# Patient Record
Sex: Female | Born: 1950 | Race: White | Hispanic: No | Marital: Single | Smoking: Former smoker
Health system: Southern US, Community
[De-identification: ages and names within clinical notes are randomized; demographics above are authoritative.]

## PROBLEM LIST (undated history)

## (undated) DIAGNOSIS — M8580 Other specified disorders of bone density and structure, unspecified site: Secondary | ICD-10-CM

## (undated) DIAGNOSIS — T7840XA Allergy, unspecified, initial encounter: Secondary | ICD-10-CM

## (undated) DIAGNOSIS — I639 Cerebral infarction, unspecified: Secondary | ICD-10-CM

## (undated) DIAGNOSIS — M810 Age-related osteoporosis without current pathological fracture: Secondary | ICD-10-CM

## (undated) DIAGNOSIS — H269 Unspecified cataract: Secondary | ICD-10-CM

## (undated) DIAGNOSIS — C4491 Basal cell carcinoma of skin, unspecified: Secondary | ICD-10-CM

## (undated) DIAGNOSIS — I10 Essential (primary) hypertension: Secondary | ICD-10-CM

## (undated) DIAGNOSIS — Z5189 Encounter for other specified aftercare: Secondary | ICD-10-CM

## (undated) DIAGNOSIS — H348192 Central retinal vein occlusion, unspecified eye, stable: Secondary | ICD-10-CM

## (undated) DIAGNOSIS — M1990 Unspecified osteoarthritis, unspecified site: Secondary | ICD-10-CM

## (undated) DIAGNOSIS — E785 Hyperlipidemia, unspecified: Secondary | ICD-10-CM

## (undated) DIAGNOSIS — N200 Calculus of kidney: Secondary | ICD-10-CM

## (undated) DIAGNOSIS — H348392 Tributary (branch) retinal vein occlusion, unspecified eye, stable: Secondary | ICD-10-CM

## (undated) DIAGNOSIS — K219 Gastro-esophageal reflux disease without esophagitis: Secondary | ICD-10-CM

## (undated) DIAGNOSIS — K8020 Calculus of gallbladder without cholecystitis without obstruction: Secondary | ICD-10-CM

## (undated) HISTORY — DX: Hyperlipidemia, unspecified: E78.5

## (undated) HISTORY — PX: SPINE SURGERY: SHX786

## (undated) HISTORY — DX: Gastro-esophageal reflux disease without esophagitis: K21.9

## (undated) HISTORY — PX: CHOLECYSTECTOMY: SHX55

## (undated) HISTORY — PX: ABDOMINAL HYSTERECTOMY: SHX81

## (undated) HISTORY — PX: COLONOSCOPY: SHX174

## (undated) HISTORY — PX: APPENDECTOMY: SHX54

## (undated) HISTORY — DX: Tributary (branch) retinal vein occlusion, unspecified eye, stable: H34.8392

## (undated) HISTORY — DX: Calculus of gallbladder without cholecystitis without obstruction: K80.20

## (undated) HISTORY — DX: Basal cell carcinoma of skin, unspecified: C44.91

## (undated) HISTORY — PX: BASAL CELL CARCINOMA EXCISION: SHX1214

## (undated) HISTORY — DX: Essential (primary) hypertension: I10

## (undated) HISTORY — DX: Age-related osteoporosis without current pathological fracture: M81.0

## (undated) HISTORY — DX: Unspecified cataract: H26.9

## (undated) HISTORY — DX: Unspecified osteoarthritis, unspecified site: M19.90

## (undated) HISTORY — DX: Other specified disorders of bone density and structure, unspecified site: M85.80

## (undated) HISTORY — DX: Encounter for other specified aftercare: Z51.89

## (undated) HISTORY — DX: Allergy, unspecified, initial encounter: T78.40XA

## (undated) HISTORY — PX: JOINT REPLACEMENT: SHX530

## (undated) HISTORY — DX: Cerebral infarction, unspecified: I63.9

## (undated) HISTORY — DX: Calculus of kidney: N20.0

## (undated) HISTORY — DX: Central retinal vein occlusion, unspecified eye, stable: H34.8192

---

## 1998-08-13 ENCOUNTER — Other Ambulatory Visit: Admission: RE | Admit: 1998-08-13 | Discharge: 1998-08-13 | Payer: Self-pay | Admitting: Obstetrics and Gynecology

## 1998-08-17 ENCOUNTER — Other Ambulatory Visit: Admission: RE | Admit: 1998-08-17 | Discharge: 1998-08-17 | Payer: Self-pay | Admitting: Obstetrics and Gynecology

## 1998-08-20 ENCOUNTER — Ambulatory Visit (HOSPITAL_COMMUNITY): Admission: RE | Admit: 1998-08-20 | Discharge: 1998-08-20 | Payer: Self-pay | Admitting: Obstetrics and Gynecology

## 1998-08-20 ENCOUNTER — Encounter: Payer: Self-pay | Admitting: Obstetrics and Gynecology

## 1998-09-07 ENCOUNTER — Encounter: Payer: Self-pay | Admitting: Obstetrics and Gynecology

## 1998-09-09 ENCOUNTER — Inpatient Hospital Stay (HOSPITAL_COMMUNITY): Admission: RE | Admit: 1998-09-09 | Discharge: 1998-09-11 | Payer: Self-pay | Admitting: Obstetrics and Gynecology

## 2005-07-25 ENCOUNTER — Other Ambulatory Visit: Admission: RE | Admit: 2005-07-25 | Discharge: 2005-07-25 | Payer: Self-pay | Admitting: Obstetrics and Gynecology

## 2007-03-05 ENCOUNTER — Ambulatory Visit: Payer: Self-pay | Admitting: Gastroenterology

## 2012-09-08 ENCOUNTER — Ambulatory Visit (INDEPENDENT_AMBULATORY_CARE_PROVIDER_SITE_OTHER): Payer: BC Managed Care – PPO | Admitting: Family Medicine

## 2012-09-08 VITALS — BP 131/83 | HR 67 | Temp 98.2°F | Resp 16 | Ht 67.0 in | Wt 166.0 lb

## 2012-09-08 DIAGNOSIS — E785 Hyperlipidemia, unspecified: Secondary | ICD-10-CM

## 2012-09-08 DIAGNOSIS — Z79899 Other long term (current) drug therapy: Secondary | ICD-10-CM

## 2012-09-08 DIAGNOSIS — Z23 Encounter for immunization: Secondary | ICD-10-CM

## 2012-09-08 LAB — LIPID PANEL
Cholesterol: 191 mg/dL (ref 0–200)
HDL: 55 mg/dL (ref >39)
LDL Cholesterol: 106 mg/dL — ABNORMAL HIGH (ref 0–99)
Total CHOL/HDL Ratio: 3.5 Ratio
Triglycerides: 148 mg/dL (ref <150)
VLDL: 30 mg/dL (ref 0–40)

## 2012-09-08 LAB — COMPREHENSIVE METABOLIC PANEL
ALT: 16 U/L (ref 0–35)
AST: 17 U/L (ref 0–37)
Albumin: 4.8 g/dL (ref 3.5–5.2)
Alkaline Phosphatase: 81 U/L (ref 39–117)
BUN: 15 mg/dL (ref 6–23)
CO2: 26 mEq/L (ref 19–32)
Calcium: 10 mg/dL (ref 8.4–10.5)
Chloride: 105 mEq/L (ref 96–112)
Creat: 0.67 mg/dL (ref 0.50–1.10)
Glucose, Bld: 90 mg/dL (ref 70–99)
Potassium: 4.8 mEq/L (ref 3.5–5.3)
Sodium: 139 mEq/L (ref 135–145)
Total Bilirubin: 0.6 mg/dL (ref 0.3–1.2)
Total Protein: 7.3 g/dL (ref 6.0–8.3)

## 2012-09-08 MED ORDER — SIMVASTATIN 40 MG PO TABS: 40.0000 mg | ORAL_TABLET | Freq: Every day | ORAL | Status: DC

## 2012-09-08 NOTE — Progress Notes (Signed)
Subjective:    Patient ID: Lori Cook, female    DOB: 1951-09-06, 61 y.o.   MRN: [id number] Chief Complaint  Patient presents with  . Medication Refill    Cholesterol med; pt fasting other than a teaspoon of cream in a cup of coffee this morning; took last pill Thursday night    HPI  Needs chol check and refill on chol medication.  Needs flu shot. +Fhx, age are only risk factors.  She is tolerating the simvastatin well w/o any complaints.    Glucosamine/chondrointin for hips as developing more arthritis Past Medical History  Diagnosis Date  . Hyperlipidemia   . Blood transfusion without reported diagnosis   . Allergy    Past Surgical History  Procedure Laterality Date  . Appendectomy    . Spine surgery    . Abdominal hysterectomy     History   Social History  . Marital Status: Single    Spouse Name: N/A    Number of Children: N/A  . Years of Education: N/A   Social History Main Topics  . Smoking status: Former Smoker    Quit date: 09/08/2010  . Smokeless tobacco: None  . Alcohol Use: No  . Drug Use: No  . Sexually Active: None   Other Topics Concern  . None   Social History Narrative  . None    Family History  Problem Relation Age of Onset  . Stroke Father   . Diabetes Father   . Heart disease Father   . Heart disease Paternal Uncle    Current Outpatient Prescriptions on File Prior to Visit  Medication Sig Dispense Refill  . simvastatin (ZOCOR) 40 MG tablet Take 40 mg by mouth at bedtime.         Review of Systems  Constitutional: Positive for fatigue. Negative for fever, chills, diaphoresis and appetite change.  Eyes: Negative for visual disturbance.  Respiratory: Negative for cough and shortness of breath.   Cardiovascular: Negative for chest pain, palpitations and leg swelling.  Gastrointestinal: Negative for nausea and vomiting.  Genitourinary: Negative for decreased urine volume.  Musculoskeletal: Positive for arthralgias. Negative for  myalgias.  Skin: Negative for color change.  Neurological: Negative for syncope and headaches.  Hematological: Does not bruise/bleed easily.      BP 131/83  Pulse 67  Temp(Src) 98.2 F (36.8 C)  Resp 16  Ht 5\' 7"  (1.702 m)  Wt 166 lb (75.297 kg)  BMI 25.99 kg/m2  SpO2 98% Objective:   Physical Exam  Constitutional: She is oriented to person, place, and time. She appears well-developed and well-nourished. No distress.  HENT:  Head: Normocephalic and atraumatic.  Right Ear: External ear normal.  Left Ear: External ear normal.  Eyes: Conjunctivae are normal. No scleral icterus.  Neck: Normal range of motion. Neck supple. No thyromegaly present.  Cardiovascular: Normal rate, regular rhythm, normal heart sounds and intact distal pulses.   Pulmonary/Chest: Effort normal and breath sounds normal. No respiratory distress.  Musculoskeletal: She exhibits no edema.  Lymphadenopathy:    She has no cervical adenopathy.  Neurological: She is alert and oriented to person, place, and time.  Skin: Skin is warm and dry. She is not diaphoretic. No erythema.  Psychiatric: She has a normal mood and affect. Her behavior is normal.      Assessment & Plan:  Hyperlipidemia LDL goal < 130 - Plan: Lipid panel - refilled zocor x 1 mo only while we are waiting on labs to ensure she is on  the right medicine and dose.  Pt's only risk factors are age and family history.  Encounter for long-term (current) use of other medications - Plan: Comprehensive metabolic panel  Flu vaccine need  Meds ordered this encounter  Medications  . DISCONTD: simvastatin (ZOCOR) 40 MG tablet    Sig: Take 40 mg by mouth at bedtime.  . simvastatin (ZOCOR) 40 MG tablet    Sig: Take 1 tablet (40 mg total) by mouth at bedtime.    Dispense:  30 tablet    Refill:  0

## 2012-09-09 ENCOUNTER — Other Ambulatory Visit: Payer: Self-pay | Admitting: Family Medicine

## 2012-09-09 DIAGNOSIS — E785 Hyperlipidemia, unspecified: Secondary | ICD-10-CM | POA: Insufficient documentation

## 2012-09-09 MED ORDER — SIMVASTATIN 40 MG PO TABS: 40.0000 mg | ORAL_TABLET | Freq: Every day | ORAL | Status: DC

## 2012-12-25 ENCOUNTER — Ambulatory Visit: Payer: BC Managed Care – PPO

## 2012-12-25 ENCOUNTER — Ambulatory Visit (INDEPENDENT_AMBULATORY_CARE_PROVIDER_SITE_OTHER): Payer: BC Managed Care – PPO | Admitting: Family Medicine

## 2012-12-25 VITALS — BP 130/77 | HR 83 | Temp 98.0°F | Resp 16 | Ht 66.5 in | Wt 167.0 lb

## 2012-12-25 DIAGNOSIS — S2239XA Fracture of one rib, unspecified side, initial encounter for closed fracture: Secondary | ICD-10-CM

## 2012-12-25 DIAGNOSIS — S2232XA Fracture of one rib, left side, initial encounter for closed fracture: Secondary | ICD-10-CM

## 2012-12-25 DIAGNOSIS — R0781 Pleurodynia: Secondary | ICD-10-CM

## 2012-12-25 DIAGNOSIS — R079 Chest pain, unspecified: Secondary | ICD-10-CM

## 2012-12-25 MED ORDER — OXYCODONE-ACETAMINOPHEN 5-325 MG PO TABS: 1.0000 | ORAL_TABLET | Freq: Three times a day (TID) | ORAL | Status: DC | PRN

## 2012-12-25 MED ORDER — IBUPROFEN 800 MG PO TABS: 800.0000 mg | ORAL_TABLET | Freq: Three times a day (TID) | ORAL | Status: DC | PRN

## 2012-12-25 NOTE — Patient Instructions (Addendum)
Rib Fracture  Your caregiver has diagnosed you as having a rib fracture (a break). This can occur by a blow to the chest, by a fall against a hard object, or by violent coughing or sneezing. There may be one or many breaks. Rib fractures may heal on their own within 3 to 8 weeks. The longer healing period is usually associated with a continued cough or other aggravating activities.  HOME CARE INSTRUCTIONS    Avoid strenuous activity. Be careful during activities and avoid bumping the injured rib. Activities that cause pain pull on the fracture site(s) and are best avoided if possible.   Eat a normal, well-balanced diet. Drink plenty of fluids to avoid constipation.   Take deep breaths several times a day to keep lungs free of infection. Try to cough several times a day, splinting the injured area with a pillow. This will help prevent pneumonia.   Do not wear a rib belt or binder. These restrict breathing which can lead to pneumonia.   Only take over-the-counter or prescription medicines for pain, discomfort, or fever as directed by your caregiver.  SEEK MEDICAL CARE IF:   You develop a continual cough, associated with thick or bloody sputum.  SEEK IMMEDIATE MEDICAL CARE IF:    You have a fever.   You have difficulty breathing.   You have nausea (feeling sick to your stomach), vomiting, or abdominal (belly) pain.   You have worsening pain, not controlled with medications.  Document Released: 08/29/2005 Document Revised: 11/21/2011 Document Reviewed: 01/31/2007  Exit[employer] Patient Information 2013 [employer].

## 2012-12-25 NOTE — Progress Notes (Signed)
Subjective:    Patient ID: Lori Cook, female    DOB: 1951/04/16, 62 y.o.   MRN: [id number]   Chief Complaint  Patient presents with  . Chest Injury    left ribs pain x 3 day   HPI  Severday days ago she suddenly began choking on her dinner - asparagus - and her daughter did the HeimLech on her as she couldn't breath - it was done about 4-5 times and think she broke a left lower rib.  Still having cough that comes and goes - wonders if it could be allergies.  She has been taking ibuprofen 400mg  bid. Left lower rib is very sore with coughing and can't sleep due to pain. 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    Past Medical History  Diagnosis Date  . Hyperlipidemia   . Blood transfusion without reported diagnosis   . Allergy    Current Outpatient Prescriptions on File Prior to Visit  Medication Sig Dispense Refill  . simvastatin (ZOCOR) 40 MG tablet Take 1 tablet (40 mg total) by mouth at bedtime.  90 tablet  3   No current facility-administered medications on file prior to visit.   Allergies  Allergen Reactions  . Codeine Other (See Comments)    Headache    Review of Systems  Constitutional: Negative for fever, chills, diaphoresis, activity change and appetite change.  HENT: Positive for congestion, rhinorrhea and postnasal drip. Negative for sore throat, sneezing and sinus pressure.   Respiratory: Positive for cough and chest tightness. Negative for shortness of breath and wheezing.   Cardiovascular: Positive for chest pain.  Musculoskeletal: Positive for arthralgias. Negative for back pain and joint swelling.  Skin: Negative for rash.  Hematological: Negative for adenopathy.  Psychiatric/Behavioral: Positive for sleep disturbance.      BP 130/77  Pulse 83  Temp(Src) 98 F (36.7 C) (Oral)  Resp 16  Ht 5' 6.5" (1.689 m)  Wt 167 lb (75.751 kg)  BMI 26.55 kg/m2  SpO2 97% Objective:   Physical Exam  Constitutional: She is oriented to  person, place, and time. She appears well-developed and well-nourished. She does not appear ill. No distress.  HENT:  Head: Normocephalic and atraumatic.  Right Ear: External ear and ear canal normal. Tympanic membrane is injected and retracted. No middle ear effusion.  Left Ear: External ear and ear canal normal. Tympanic membrane is injected and retracted.  No middle ear effusion.  Nose: Mucosal edema present. No rhinorrhea.  Mouth/Throat: Uvula is midline and mucous membranes are normal. No oropharyngeal exudate, posterior oropharyngeal edema, posterior oropharyngeal erythema or tonsillar abscesses.  Eyes: Conjunctivae are normal. Right eye exhibits no discharge. Left eye exhibits no discharge. No scleral icterus.  Neck: Normal range of motion. Neck supple.  Cardiovascular: Normal rate, regular rhythm and intact distal pulses.   Murmur heard.  Decrescendo systolic murmur is present with a grade of 1/6  Pulmonary/Chest: Effort normal and breath sounds normal. She exhibits tenderness and bony tenderness. She exhibits no laceration, no deformity, no swelling and no retraction.  Lymphadenopathy:       Head (right side): No submandibular, no preauricular and no posterior auricular adenopathy present.       Head (left side): No submandibular, no preauricular and no posterior auricular adenopathy present.    She has no cervical adenopathy.       Right: No supraclavicular adenopathy present.       Left: No supraclavicular adenopathy present.  Neurological: She  is alert and oriented to person, place, and time.  Skin: Skin is warm and dry. She is not diaphoretic. No erythema.  Psychiatric: She has a normal mood and affect. Her behavior is normal.     [facility] reading (PRIMARY) by  Dr. Clelia Croft. Left rib films - lower lateral rib fracture, looks like rib 10.  No pneumothorax seen. Assessment & Plan:  Rib pain on left side - Plan: DG Ribs Unilateral W/Chest Left  Rib fracture, left, closed, initial  encounter - Pain control with medication - ibuprofen 800 tid plus prn percocet. Make sure pt keeps on breathing deep.  Percocet should help with cough suppression as well. Start allergy med like otc zyrtec.  Meds ordered this encounter  Medications  . oxyCODONE-acetaminophen (ROXICET) 5-325 MG per tablet    Sig: Take 1 tablet by mouth every 8 (eight) hours as needed for pain.    Dispense:  30 tablet    Refill:  0  . ibuprofen (ADVIL,MOTRIN) 800 MG tablet    Sig: Take 1 tablet (800 mg total) by mouth every 8 (eight) hours as needed for pain.    Dispense:  30 tablet    Refill:  2

## 2013-07-18 ENCOUNTER — Other Ambulatory Visit: Payer: Self-pay

## 2013-09-19 ENCOUNTER — Ambulatory Visit (INDEPENDENT_AMBULATORY_CARE_PROVIDER_SITE_OTHER): Payer: BC Managed Care – PPO | Admitting: Emergency Medicine

## 2013-09-19 VITALS — BP 142/84 | HR 68 | Temp 97.9°F | Resp 16 | Ht 67.0 in | Wt 171.6 lb

## 2013-09-19 DIAGNOSIS — Z23 Encounter for immunization: Secondary | ICD-10-CM

## 2013-09-19 DIAGNOSIS — H60339 Swimmer's ear, unspecified ear: Secondary | ICD-10-CM

## 2013-09-19 DIAGNOSIS — H60399 Other infective otitis externa, unspecified ear: Secondary | ICD-10-CM

## 2013-09-19 MED ORDER — CIPROFLOXACIN-HYDROCORTISONE 0.2-1 % OT SUSP: 3.0000 [drp] | Freq: Two times a day (BID) | OTIC | Status: DC

## 2013-09-19 NOTE — Patient Instructions (Signed)
Otitis Externa Otitis externa is a bacterial or fungal infection of the outer ear canal. This is the area from the eardrum to the outside of the ear. Otitis externa is sometimes called "swimmer's ear." CAUSES  Possible causes of infection include:  Swimming in dirty water.  Moisture remaining in the ear after swimming or bathing.  Mild injury (trauma) to the ear.  Objects stuck in the ear (foreign body).  Cuts or scrapes (abrasions) on the outside of the ear. SYMPTOMS  The first symptom of infection is often itching in the ear canal. Later signs and symptoms may include swelling and redness of the ear canal, ear pain, and yellowish-white fluid (pus) coming from the ear. The ear pain may be worse when pulling on the earlobe. DIAGNOSIS  Your caregiver will perform a physical exam. A sample of fluid may be taken from the ear and examined for bacteria or fungi. TREATMENT  Antibiotic ear drops are often given for 10 to 14 days. Treatment may also include pain medicine or corticosteroids to reduce itching and swelling. PREVENTION   Keep your ear dry. Use the corner of a towel to absorb water out of the ear canal after swimming or bathing.  Avoid scratching or putting objects inside your ear. This can damage the ear canal or remove the protective wax that lines the canal. This makes it easier for bacteria and fungi to grow.  Avoid swimming in lakes, polluted water, or poorly chlorinated pools.  You may use ear drops made of rubbing alcohol and vinegar after swimming. Combine equal parts of white vinegar and alcohol in a bottle. Put 3 or 4 drops into each ear after swimming. HOME CARE INSTRUCTIONS   Apply antibiotic ear drops to the ear canal as prescribed by your caregiver.  Only take over-the-counter or prescription medicines for pain, discomfort, or fever as directed by your caregiver.  If you have diabetes, follow any additional treatment instructions from your caregiver.  Keep all  follow-up appointments as directed by your caregiver. SEEK MEDICAL CARE IF:   You have a fever.  Your ear is still red, swollen, painful, or draining pus after 3 days.  Your redness, swelling, or pain gets worse.  You have a severe headache.  You have redness, swelling, pain, or tenderness in the area behind your ear. MAKE SURE YOU:   Understand these instructions.  Will watch your condition.  Will get help right away if you are not doing well or get worse. Document Released: 08/29/2005 Document Revised: 11/21/2011 Document Reviewed: 09/15/2011 [employer] Patient Information 2014 [employer].  

## 2013-09-19 NOTE — Progress Notes (Signed)
[facility] [address], [place] [address] [phone]  Date:  09/19/2013   Name:  Lori Cook   DOB:  12/25/1950   MRN:  [id number]  PCP:  No primary provider on file.    Chief Complaint: Rash, Otalgia and Flu Vaccine   History of Present Illness:  Lori Cook is a 63 y.o. very pleasant female patient who presents with the following:  Patient has a two week history of ear pain that stopped.  She says yesterday the pain in her right ear returned.  No sore throat, coryza, fever or chills.  Mild intermittent non productive cough.  No wheezing or shortness of breath .  Attributes cough to allergies.   Requests a flu shot.   No improvement with over the counter medications or other home remedies. Denies other complaint or health concern today.   Patient Active Problem List   Diagnosis Date Noted  . Hyperlipidemia LDL goal < 130 09/09/2012    Past Medical History  Diagnosis Date  . Hyperlipidemia   . Blood transfusion without reported diagnosis   . Allergy     Past Surgical History  Procedure Laterality Date  . Appendectomy    . Spine surgery    . Abdominal hysterectomy      History  Substance Use Topics  . Smoking status: Former Smoker    Quit date: 09/08/2010  . Smokeless tobacco: Not on file  . Alcohol Use: No    Family History  Problem Relation Age of Onset  . Stroke Father   . Diabetes Father   . Heart disease Father   . Heart disease Paternal Uncle     Allergies  Allergen Reactions  . Codeine Other (See Comments)    Headache    Medication list has been reviewed and updated.  Current Outpatient Prescriptions on File Prior to Visit  Medication Sig Dispense Refill  . simvastatin (ZOCOR) 40 MG tablet Take 1 tablet (40 mg total) by mouth at bedtime.  90 tablet  3  . ibuprofen (ADVIL,MOTRIN) 800 MG tablet Take 1 tablet (800 mg total) by mouth every 8 (eight) hours as needed for pain.  30 tablet  2  .  oxyCODONE-acetaminophen (ROXICET) 5-325 MG per tablet Take 1 tablet by mouth every 8 (eight) hours as needed for pain.  30 tablet  0   No current facility-administered medications on file prior to visit.    Review of Systems:  As per HPI, otherwise negative.    Physical Examination: Filed Vitals:   09/19/13 1844  BP: 142/84  Pulse: 68  Temp: 97.9 F (36.6 C)  Resp: 16   Filed Vitals:   09/19/13 1844  Height: 5\' 7"  (1.702 m)  Weight: 171 lb 9.6 oz (77.837 kg)   Body mass index is 26.87 kg/(m^2). Ideal Body Weight: Weight in (lb) to have BMI = 25: 159.3  GEN: WDWN, NAD, Non-toxic, A & O x 3 HEENT: Atraumatic, Normocephalic. Neck supple. No masses, No LAD. Ears and Nose: No external deformity.  Right external otitis CV: RRR, No M/G/R. No JVD. No thrill. No extra heart sounds. PULM: CTA B, no wheezes, crackles, rhonchi. No retractions. No resp. distress. No accessory muscle use. ABD: S, NT, ND, +BS. No rebound. No HSM. EXTR: No c/c/e NEURO Normal gait.  PSYCH: Normally interactive. Conversant. Not depressed or anxious appearing.  Calm demeanor.    Assessment and Plan: Otitis externa cipro HC    Signed,  Ellison Carwin,  MD

## 2013-11-09 ENCOUNTER — Ambulatory Visit (INDEPENDENT_AMBULATORY_CARE_PROVIDER_SITE_OTHER): Payer: BC Managed Care – PPO | Admitting: Family Medicine

## 2013-11-09 VITALS — BP 144/90 | HR 66 | Temp 97.9°F | Resp 16 | Ht 67.0 in | Wt 169.0 lb

## 2013-11-09 DIAGNOSIS — E785 Hyperlipidemia, unspecified: Secondary | ICD-10-CM

## 2013-11-09 DIAGNOSIS — Z23 Encounter for immunization: Secondary | ICD-10-CM

## 2013-11-09 LAB — COMPREHENSIVE METABOLIC PANEL
ALT: 21 U/L (ref 0–35)
AST: 20 U/L (ref 0–37)
Albumin: 4.7 g/dL (ref 3.5–5.2)
Alkaline Phosphatase: 94 U/L (ref 39–117)
BILIRUBIN TOTAL: 0.6 mg/dL (ref 0.2–1.2)
BUN: 13 mg/dL (ref 6–23)
CHLORIDE: 105 meq/L (ref 96–112)
CO2: 27 mEq/L (ref 19–32)
CREATININE: 0.6 mg/dL (ref 0.50–1.10)
Calcium: 10.3 mg/dL (ref 8.4–10.5)
Glucose, Bld: 91 mg/dL (ref 70–99)
Potassium: 5 mEq/L (ref 3.5–5.3)
Sodium: 141 mEq/L (ref 135–145)
Total Protein: 7.5 g/dL (ref 6.0–8.3)

## 2013-11-09 LAB — LIPID PANEL
CHOL/HDL RATIO: 3.9 ratio
Cholesterol: 193 mg/dL (ref 0–200)
HDL: 49 mg/dL (ref >39)
LDL Cholesterol: 102 mg/dL — ABNORMAL HIGH (ref 0–99)
Triglycerides: 211 mg/dL — ABNORMAL HIGH (ref <150)
VLDL: 42 mg/dL — ABNORMAL HIGH (ref 0–40)

## 2013-11-09 MED ORDER — ZOSTER VACCINE LIVE 19400 UNT/0.65ML ~~LOC~~ SOLR: 0.6500 mL | Freq: Once | SUBCUTANEOUS | Status: DC

## 2013-11-09 MED ORDER — SIMVASTATIN 40 MG PO TABS: 40.0000 mg | ORAL_TABLET | Freq: Every day | ORAL | Status: DC

## 2013-11-09 NOTE — Patient Instructions (Addendum)
TDAP and pneumococcal vaccine will be given today  Take the prescription for the shingles vaccine to a pharmacy to get your vaccination.  Advise returning to your dermatologist  Continue the same medicine for your cholesterol  Get regular exercise  Return as needed or in one year

## 2013-11-09 NOTE — Progress Notes (Signed)
Subjective: 63 year old lady who is here for a followup on her lipids and refill of medications. She feels well. Review of systems is essentially unremarkable. HEENT normal. Cardiorespiratory unremarkable. GI and GU unremarkable. Osseous skeletal unremarkable. She has some aches and pains. 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 She has questions about the shingles vaccine which we discussed.  Objective: Alert healthy appearing lady in no distress. Her TMs are normal. Murmurs gallops or arrhythmias. Abdomen soft and nontender without masses. Extremities without edema. She does have a splotchy rash on her left leg upper portion of the shin. This has been biopsied in the past and she is seeing a dermatologist in the past. He has a mildly active appearing border with a maroon discoloration centrally.  Assessment: Hyperlipidemia Immunization review Dermatitis leg, etiology unclear--old pathology report from 2007 located. The diagnosis was granulomatous perifolliculitis  Plan: I think she needs to see the dermatologist back Shingles vaccine Continue lipid medications and check labs today

## 2013-11-10 ENCOUNTER — Encounter: Payer: Self-pay | Admitting: Family Medicine

## 2014-01-30 ENCOUNTER — Encounter: Payer: Self-pay | Admitting: Emergency Medicine

## 2014-11-26 ENCOUNTER — Ambulatory Visit (INDEPENDENT_AMBULATORY_CARE_PROVIDER_SITE_OTHER): Payer: BLUE CROSS/BLUE SHIELD | Admitting: Family Medicine

## 2014-11-26 VITALS — BP 138/86 | HR 57 | Temp 97.5°F | Resp 16 | Ht 67.0 in | Wt 165.0 lb

## 2014-11-26 DIAGNOSIS — R2231 Localized swelling, mass and lump, right upper limb: Secondary | ICD-10-CM

## 2014-11-26 DIAGNOSIS — E785 Hyperlipidemia, unspecified: Secondary | ICD-10-CM

## 2014-11-26 DIAGNOSIS — Z5181 Encounter for therapeutic drug level monitoring: Secondary | ICD-10-CM | POA: Diagnosis not present

## 2014-11-26 LAB — COMPREHENSIVE METABOLIC PANEL
ALBUMIN: 4.4 g/dL (ref 3.5–5.2)
ALK PHOS: 90 U/L (ref 39–117)
ALT: 15 U/L (ref 0–35)
AST: 17 U/L (ref 0–37)
BUN: 13 mg/dL (ref 6–23)
CALCIUM: 9.7 mg/dL (ref 8.4–10.5)
CO2: 25 mEq/L (ref 19–32)
Chloride: 104 mEq/L (ref 96–112)
Creat: 0.58 mg/dL (ref 0.50–1.10)
Glucose, Bld: 81 mg/dL (ref 70–99)
POTASSIUM: 4.3 meq/L (ref 3.5–5.3)
SODIUM: 141 meq/L (ref 135–145)
TOTAL PROTEIN: 6.9 g/dL (ref 6.0–8.3)
Total Bilirubin: 0.5 mg/dL (ref 0.2–1.2)

## 2014-11-26 LAB — LIPID PANEL
CHOLESTEROL: 180 mg/dL (ref 0–200)
HDL: 48 mg/dL (ref >=46)
LDL CALC: 94 mg/dL (ref 0–99)
TRIGLYCERIDES: 190 mg/dL — AB (ref <150)
Total CHOL/HDL Ratio: 3.8 Ratio
VLDL: 38 mg/dL (ref 0–40)

## 2014-11-26 MED ORDER — SIMVASTATIN 40 MG PO TABS: 40.0000 mg | ORAL_TABLET | Freq: Every day | ORAL | Status: DC

## 2014-11-26 NOTE — Patient Instructions (Signed)
We will set you up to see the hand specialist for the bump on your wrist I will be in touch with your labs and refilled your cholesterol medication today Take care!

## 2014-11-26 NOTE — Progress Notes (Signed)
[facility] [address], [place] [address] [phone]  Date:  11/26/2014   Name:  Lori Cook   DOB:  1951/03/07   MRN:  [id number]  PCP:  No primary care provider on file.    Chief Complaint: Medication Refill   History of Present Illness:  Lori Cook is a 64 y.o. very pleasant female patient who presents with the following:  She is here today for labs- she is fasting. She is generally in good health except for high cholesterol.  She is on simvastatin and needs this refilled.   She has also noted a bump on the ventrum of her right wrist for a couple of years- it does not hurt but seems to be getting a bit larger.   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 Hyperlipidemia LDL goal < 130 09/09/2012    Past Medical History  Diagnosis Date  . Hyperlipidemia   . Blood transfusion without reported diagnosis   . Allergy     Past Surgical History  Procedure Laterality Date  . Appendectomy    . Spine surgery    . Abdominal hysterectomy      History  Substance Use Topics  . Smoking status: Former Smoker    Quit date: 09/08/2010  . Smokeless tobacco: Not on file  . Alcohol Use: No    Family History  Problem Relation Age of Onset  . Stroke Father   . Diabetes Father   . Heart disease Father   . Heart disease Paternal Uncle     Allergies  Allergen Reactions  . Codeine Other (See Comments)    Headache    Medication list has been reviewed and updated.  Current Outpatient Prescriptions on File Prior to Visit  Medication Sig Dispense Refill  . ibuprofen (ADVIL,MOTRIN) 800 MG tablet Take 1 tablet (800 mg total) by mouth every 8 (eight) hours as needed for pain. 30 tablet 2  . simvastatin (ZOCOR) 40 MG tablet Take 1 tablet (40 mg total) by mouth at bedtime. 90 tablet 3  . zoster vaccine live, PF, (ZOSTAVAX) 73220 UNT/0.65ML injection Inject 19,400 Units into the  skin once. 1 each 0   No current facility-administered medications on file prior to visit.    Review of Systems:  As per HPI- otherwise negative.   Physical Examination: Filed Vitals:   11/26/14 1102  BP: 138/86  Pulse: 57  Temp: 97.5 F (36.4 C)  Resp: 16   Filed Vitals:   11/26/14 1102  Height: 5\' 7"  (1.702 m)  Weight: 165 lb (74.844 kg)   Body mass index is 25.84 kg/(m^2). Ideal Body Weight: Weight in (lb) to have BMI = 25: 159.3  GEN: WDWN, NAD, Non-toxic, A & O x 3, looks well HEENT: Atraumatic, Normocephalic. Neck supple. No masses, No LAD. Ears and Nose: No external deformity. CV: RRR, No M/G/R. No JVD. No thrill. No extra heart sounds. PULM: CTA B, no wheezes, crackles, rhonchi. No retractions. No resp. distress. No accessory muscle use. EXTR: No c/c/e NEURO Normal gait.  PSYCH: Normally interactive. Conversant. Not depressed or anxious appearing.  Calm demeanor.  Right wrist: there is a small, soft mass on the ventral side of her wrist. It is compressible and will disappear into her wrist with pressure.  No tenderness, redness or heat  Assessment and Plan: Hyperlipidemia - Plan: simvastatin (ZOCOR) 40 MG tablet, Lipid panel  Medication  monitoring encounter - Plan: Comprehensive metabolic panel  Mass of wrist, right - Plan: Ambulatory referral to Hand Surgery  Await labs and will be in touch with her, refilled her chl medication, referral to look at her wrist   Signed Lamar Blinks, MD

## 2015-01-24 LAB — HM MAMMOGRAPHY

## 2015-02-02 ENCOUNTER — Other Ambulatory Visit: Payer: Self-pay | Admitting: Orthopedic Surgery

## 2015-02-02 DIAGNOSIS — M25531 Pain in right wrist: Secondary | ICD-10-CM

## 2015-02-10 ENCOUNTER — Other Ambulatory Visit: Payer: Self-pay

## 2015-02-11 DIAGNOSIS — H348392 Tributary (branch) retinal vein occlusion, unspecified eye, stable: Secondary | ICD-10-CM

## 2015-02-11 HISTORY — DX: Tributary (branch) retinal vein occlusion, unspecified eye, stable: H34.8392

## 2015-02-19 ENCOUNTER — Ambulatory Visit
Admission: RE | Admit: 2015-02-19 | Discharge: 2015-02-19 | Disposition: A | Discharge status: Home / Self Care | Payer: BLUE CROSS/BLUE SHIELD | Source: Ambulatory Visit | Attending: Orthopedic Surgery | Admitting: Orthopedic Surgery

## 2015-02-19 ENCOUNTER — Other Ambulatory Visit: Payer: Self-pay | Admitting: Orthopedic Surgery

## 2015-02-19 DIAGNOSIS — M25531 Pain in right wrist: Secondary | ICD-10-CM

## 2015-03-08 ENCOUNTER — Ambulatory Visit (INDEPENDENT_AMBULATORY_CARE_PROVIDER_SITE_OTHER): Payer: BLUE CROSS/BLUE SHIELD | Admitting: Internal Medicine

## 2015-03-08 VITALS — BP 162/90 | HR 67 | Temp 98.4°F | Resp 18 | Wt 162.0 lb

## 2015-03-08 DIAGNOSIS — H349 Unspecified retinal vascular occlusion: Secondary | ICD-10-CM | POA: Diagnosis not present

## 2015-03-08 DIAGNOSIS — H348192 Central retinal vein occlusion, unspecified eye, stable: Secondary | ICD-10-CM

## 2015-03-08 DIAGNOSIS — E785 Hyperlipidemia, unspecified: Secondary | ICD-10-CM | POA: Diagnosis not present

## 2015-03-08 DIAGNOSIS — I10 Essential (primary) hypertension: Secondary | ICD-10-CM | POA: Diagnosis not present

## 2015-03-08 LAB — COMPREHENSIVE METABOLIC PANEL
ALBUMIN: 4.6 g/dL (ref 3.5–5.2)
ALK PHOS: 96 U/L (ref 39–117)
ALT: 17 U/L (ref 0–35)
AST: 17 U/L (ref 0–37)
BUN: 18 mg/dL (ref 6–23)
CALCIUM: 9.9 mg/dL (ref 8.4–10.5)
CO2: 27 meq/L (ref 19–32)
Chloride: 106 mEq/L (ref 96–112)
Creat: 0.66 mg/dL (ref 0.50–1.10)
GLUCOSE: 93 mg/dL (ref 70–99)
POTASSIUM: 4.7 meq/L (ref 3.5–5.3)
SODIUM: 141 meq/L (ref 135–145)
Total Bilirubin: 0.6 mg/dL (ref 0.2–1.2)
Total Protein: 7.5 g/dL (ref 6.0–8.3)

## 2015-03-08 LAB — POCT URINALYSIS DIPSTICK
BILIRUBIN UA: NEGATIVE
Glucose, UA: NEGATIVE
KETONES UA: NEGATIVE
Leukocytes, UA: NEGATIVE
NITRITE UA: NEGATIVE
PROTEIN UA: NEGATIVE
Spec Grav, UA: 1.025
UROBILINOGEN UA: 0.2
pH, UA: 5

## 2015-03-08 LAB — CBC WITH DIFFERENTIAL/PLATELET
BASOS PCT: 0 % (ref 0–1)
Basophils Absolute: 0 10*3/uL (ref 0.0–0.1)
EOS ABS: 0.2 10*3/uL (ref 0.0–0.7)
Eosinophils Relative: 2 % (ref 0–5)
HCT: 42.2 % (ref 36.0–46.0)
Hemoglobin: 14.7 g/dL (ref 12.0–15.0)
LYMPHS ABS: 2.8 10*3/uL (ref 0.7–4.0)
LYMPHS PCT: 35 % (ref 12–46)
MCH: 30.1 pg (ref 26.0–34.0)
MCHC: 34.8 g/dL (ref 30.0–36.0)
MCV: 86.3 fL (ref 78.0–100.0)
MPV: 12.7 fL — ABNORMAL HIGH (ref 8.6–12.4)
Monocytes Absolute: 0.9 10*3/uL (ref 0.1–1.0)
Monocytes Relative: 11 % (ref 3–12)
NEUTROS ABS: 4.1 10*3/uL (ref 1.7–7.7)
Neutrophils Relative %: 52 % (ref 43–77)
Platelets: 276 10*3/uL (ref 150–400)
RBC: 4.89 MIL/uL (ref 3.87–5.11)
RDW: 13.6 % (ref 11.5–15.5)
WBC: 7.9 10*3/uL (ref 4.0–10.5)

## 2015-03-08 LAB — POCT UA - MICROSCOPIC ONLY
CRYSTALS, UR, HPF, POC: NEGATIVE
Casts, Ur, LPF, POC: NEGATIVE
YEAST UA: NEGATIVE

## 2015-03-08 LAB — POCT GLYCOSYLATED HEMOGLOBIN (HGB A1C): Hemoglobin A1C: 5.5

## 2015-03-08 MED ORDER — LISINOPRIL 10 MG PO TABS: 10.0000 mg | ORAL_TABLET | Freq: Every day | ORAL | Status: DC

## 2015-03-08 NOTE — Progress Notes (Signed)
Subjective:    Patient ID: Lori Cook, female    DOB: 1951-01-29, 64 y.o.   MRN: [id number] This chart was scribed for Tami Lin, MD by Zola Button, Medical Scribe. This patient was seen in Room 2 and the patient's care was started at 9:46 AM.   HPI HPI Comments: Aury Scollard is a 64 y.o. female with a hx of hyperlipidemia who presents to the [facility] to be evaluated for possible hypertension. Patient had been told by her eye doctor to be checked for hypertension. Patient notes that she woke up with a headache yesterday and today. She also reports having some right ear pain that started last night, but has improved some this morning. Her blood pressure at triage today was 162/90. Patient denies hearing changes, nausea, dizziness, SOB, urinary symptoms, leg swelling, and palpitations. FMHx includes hypertension in her brother, sister, and father. She does not check her blood pressure at home. She states that her blood pressure was relatively normal when she was seen here 3 months ago and when she had her mammogram within the last 3 months.  Patient had sudden onset blurred vision in her right eye that started 3-4 weeks ago. She initially thought this was due to cataracts, but she was found to have a branch retinal vein occlusion upon eye evaluation. She still has some blurring of her vision in her right eye currently. She has an appointment with a retina specialist on 7/13.  Patient Active Problem List   Diagnosis Date Noted  . Hyperlipidemia LDL goal < 130 09/09/2012    Review of Systems  Constitutional: Negative for activity change, appetite change, fatigue and unexpected weight change.  Eyes: Positive for visual disturbance. Negative for photophobia and redness.  Respiratory: Negative for chest tightness and shortness of breath.   Cardiovascular: Negative for chest pain, palpitations and leg swelling.  Gastrointestinal: Negative for abdominal pain.    Genitourinary: Negative for difficulty urinating.  Musculoskeletal: Negative for back pain.  Neurological: Negative for tremors and speech difficulty.  Hematological: Negative for adenopathy. Does not bruise/bleed easily.  Psychiatric/Behavioral: Negative for behavioral problems and sleep disturbance.       Objective:   Physical Exam  Constitutional: She is oriented to person, place, and time. She appears well-developed and well-nourished. No distress.  HENT:  Head: Normocephalic and atraumatic.  Right Ear: External ear normal.  Left Ear: External ear normal.  Mouth/Throat: Oropharynx is clear and moist. No oropharyngeal exudate.  Eyes: Conjunctivae and EOM are normal. Pupils are equal, round, and reactive to light.  Neck: Neck supple. No thyromegaly present.  Cardiovascular: Normal rate, regular rhythm, normal heart sounds and intact distal pulses.   No murmur heard. Pulmonary/Chest: Effort normal and breath sounds normal.  Musculoskeletal: She exhibits no edema.  Lymphadenopathy:    She has no cervical adenopathy.  Neurological: She is alert and oriented to person, place, and time. No cranial nerve deficit. Coordination normal.  Skin: Skin is warm and dry. No rash noted.  Psychiatric: She has a normal mood and affect. Her behavior is normal. Thought content normal.  Vitals reviewed. BP 162/90 mmHg  Pulse 67  Temp(Src) 98.4 F (36.9 C) (Oral)  Resp 18  Wt 162 lb (73.483 kg)  SpO2 99%  Results for orders placed or performed in visit on 03/08/15  POCT urinalysis dipstick  Result Value Ref Range   Color, UA yelow    Clarity, UA clear    Glucose, UA neg  Bilirubin, UA neg    Ketones, UA neg    Spec Grav, UA 1.025    Blood, UA small    pH, UA 5.0    Protein, UA neg    Urobilinogen, UA 0.2    Nitrite, UA neg    Leukocytes, UA Negative Negative  POCT UA - Microscopic Only  Result Value Ref Range   WBC, Ur, HPF, POC 1-3    RBC, urine, microscopic 3-5    Bacteria, U  Microscopic trace    Mucus, UA 1+    Epithelial cells, urine per micros 2-4    Crystals, Ur, HPF, POC neg    Casts, Ur, LPF, POC neg    Yeast, UA neg   POCT glycosylated hemoglobin (Hb A1C)  Result Value Ref Range   Hemoglobin A1C 5.5        Assessment & Plan:  Problem #1 new onset hypertension Problem #2 recent retinal vein occlusion Problem #3 underlying hyperlipidemia  Start lisinopril Baseline labs for hypertension Home blood pressures Follow-up blood pressure results 4 weeks at 104 to complete diagnostic workup including EKG and possibly chest x-ray  I have completed the patient encounter in its entirety as documented by the scribe, with editing by me where necessary. Emalie Mcwethy P. Laney Pastor, M.D.

## 2015-03-09 ENCOUNTER — Telehealth: Payer: Self-pay | Admitting: *Deleted

## 2015-03-09 NOTE — Telephone Encounter (Signed)
Received mammo report dated 01/24/15 - no mammographic evidence of malignancy.  Updated health maintenance, abstracted and sent to med rec to scan.

## 2015-03-09 NOTE — Telephone Encounter (Signed)
-----   Message from Ritta Slot sent at 03/08/2015  9:40 AM EDT ----- Pt states she had a mammo a couple months ago at [facility]/dr betrand.

## 2015-03-09 NOTE — Telephone Encounter (Signed)
Per message from Jerilee Hoh, I faxed mammo request to [place]....  Will update health maintenance once received.

## 2015-03-11 NOTE — Progress Notes (Signed)
lmom to call to make an appt Dr Brigitte Pulse HTN f/u 4 weeks

## 2015-03-17 ENCOUNTER — Telehealth: Payer: Self-pay

## 2015-03-17 NOTE — Telephone Encounter (Signed)
Dr. Laney Pastor,  Patient was prescribed BP medication and now has a cough (one of the side effects of the medication)     Please call and advise  [phone]

## 2015-03-18 MED ORDER — HYDROCHLOROTHIAZIDE 12.5 MG PO CAPS: 12.5000 mg | ORAL_CAPSULE | Freq: Every day | ORAL | Status: DC

## 2015-03-18 NOTE — Telephone Encounter (Signed)
Stop lisin Start hct 12.5 mg Set up f/u appt w/ dr Brigitte Pulse as directed

## 2015-03-18 NOTE — Telephone Encounter (Signed)
Spoke with pt, advised message from Dr. Doolittle. 

## 2015-04-24 ENCOUNTER — Ambulatory Visit (INDEPENDENT_AMBULATORY_CARE_PROVIDER_SITE_OTHER): Payer: BLUE CROSS/BLUE SHIELD

## 2015-04-24 ENCOUNTER — Ambulatory Visit (INDEPENDENT_AMBULATORY_CARE_PROVIDER_SITE_OTHER): Payer: BLUE CROSS/BLUE SHIELD | Admitting: Family Medicine

## 2015-04-24 VITALS — BP 140/86 | HR 69 | Temp 97.6°F | Resp 14 | Ht 67.0 in | Wt 160.0 lb

## 2015-04-24 DIAGNOSIS — R05 Cough: Secondary | ICD-10-CM | POA: Diagnosis not present

## 2015-04-24 DIAGNOSIS — R059 Cough, unspecified: Secondary | ICD-10-CM

## 2015-04-24 DIAGNOSIS — R682 Dry mouth, unspecified: Secondary | ICD-10-CM

## 2015-04-24 DIAGNOSIS — E7800 Pure hypercholesterolemia, unspecified: Secondary | ICD-10-CM

## 2015-04-24 DIAGNOSIS — R0789 Other chest pain: Secondary | ICD-10-CM

## 2015-04-24 DIAGNOSIS — I10 Essential (primary) hypertension: Secondary | ICD-10-CM | POA: Insufficient documentation

## 2015-04-24 DIAGNOSIS — R0781 Pleurodynia: Secondary | ICD-10-CM

## 2015-04-24 DIAGNOSIS — H34839 Tributary (branch) retinal vein occlusion, unspecified eye: Secondary | ICD-10-CM

## 2015-04-24 DIAGNOSIS — H348392 Tributary (branch) retinal vein occlusion, unspecified eye, stable: Secondary | ICD-10-CM | POA: Insufficient documentation

## 2015-04-24 DIAGNOSIS — J301 Allergic rhinitis due to pollen: Secondary | ICD-10-CM | POA: Diagnosis not present

## 2015-04-24 LAB — LIPID PANEL
CHOLESTEROL: 186 mg/dL (ref 125–200)
HDL: 51 mg/dL (ref >=46)
LDL Cholesterol: 90 mg/dL (ref <130)
TRIGLYCERIDES: 223 mg/dL — AB (ref <150)
Total CHOL/HDL Ratio: 3.6 Ratio (ref <=5.0)
VLDL: 45 mg/dL — AB (ref <30)

## 2015-04-24 LAB — CBC WITH DIFFERENTIAL/PLATELET
Basophils Absolute: 0 10*3/uL (ref 0.0–0.1)
Basophils Relative: 0 % (ref 0–1)
EOS PCT: 1 % (ref 0–5)
Eosinophils Absolute: 0.1 10*3/uL (ref 0.0–0.7)
HCT: 42.8 % (ref 36.0–46.0)
HEMOGLOBIN: 14.8 g/dL (ref 12.0–15.0)
LYMPHS PCT: 34 % (ref 12–46)
Lymphs Abs: 3 10*3/uL (ref 0.7–4.0)
MCH: 29.8 pg (ref 26.0–34.0)
MCHC: 34.6 g/dL (ref 30.0–36.0)
MCV: 86.1 fL (ref 78.0–100.0)
MONO ABS: 1 10*3/uL (ref 0.1–1.0)
MPV: 12.6 fL — AB (ref 8.6–12.4)
Monocytes Relative: 11 % (ref 3–12)
Neutro Abs: 4.8 10*3/uL (ref 1.7–7.7)
Neutrophils Relative %: 54 % (ref 43–77)
Platelets: 311 10*3/uL (ref 150–400)
RBC: 4.97 MIL/uL (ref 3.87–5.11)
RDW: 13.9 % (ref 11.5–15.5)
WBC: 8.8 10*3/uL (ref 4.0–10.5)

## 2015-04-24 LAB — COMPREHENSIVE METABOLIC PANEL
ALBUMIN: 4.9 g/dL (ref 3.6–5.1)
ALT: 18 U/L (ref 6–29)
AST: 21 U/L (ref 10–35)
Alkaline Phosphatase: 94 U/L (ref 33–130)
BILIRUBIN TOTAL: 0.5 mg/dL (ref 0.2–1.2)
BUN: 15 mg/dL (ref 7–25)
CALCIUM: 10.5 mg/dL — AB (ref 8.6–10.4)
CO2: 30 mmol/L (ref 20–31)
Chloride: 99 mmol/L (ref 98–110)
Creat: 0.75 mg/dL (ref 0.50–0.99)
GLUCOSE: 96 mg/dL (ref 65–99)
Potassium: 4.3 mmol/L (ref 3.5–5.3)
SODIUM: 139 mmol/L (ref 135–146)
Total Protein: 7.6 g/dL (ref 6.1–8.1)

## 2015-04-24 LAB — TSH: TSH: 3.242 u[IU]/mL (ref 0.350–4.500)

## 2015-04-24 MED ORDER — OMEPRAZOLE 20 MG PO CPDR: 20.0000 mg | DELAYED_RELEASE_CAPSULE | Freq: Every day | ORAL | Status: DC

## 2015-04-24 MED ORDER — HYDROCHLOROTHIAZIDE 12.5 MG PO CAPS: 12.5000 mg | ORAL_CAPSULE | Freq: Every day | ORAL | Status: DC

## 2015-04-24 NOTE — Patient Instructions (Signed)
1.  Call in one if cough has persisted.  Hypertension Hypertension, commonly called high blood pressure, is when the force of blood pumping through your arteries is too strong. Your arteries are the blood vessels that carry blood from your heart throughout your body. A blood pressure reading consists of a higher number over a lower number, such as 110/72. The higher number (systolic) is the pressure inside your arteries when your heart pumps. The lower number (diastolic) is the pressure inside your arteries when your heart relaxes. Ideally you want your blood pressure below 120/80. Hypertension forces your heart to work harder to pump blood. Your arteries may become narrow or stiff. Having hypertension puts you at risk for heart disease, stroke, and other problems.  RISK FACTORS Some risk factors for high blood pressure are controllable. Others are not.  Risk factors you cannot control include:   Race. You may be at higher risk if you are African American.  Age. Risk increases with age.  Gender. Men are at higher risk than women before age 33 years. After age 59, women are at higher risk than men. Risk factors you can control include:  Not getting enough exercise or physical activity.  Being overweight.  Getting too much fat, sugar, calories, or salt in your diet.  Drinking too much alcohol. SIGNS AND SYMPTOMS Hypertension does not usually cause signs or symptoms. Extremely high blood pressure (hypertensive crisis) may cause headache, anxiety, shortness of breath, and nosebleed. DIAGNOSIS  To check if you have hypertension, your health care provider will measure your blood pressure while you are seated, with your arm held at the level of your heart. It should be measured at least twice using the same arm. Certain conditions can cause a difference in blood pressure between your right and left arms. A blood pressure reading that is higher than normal on one occasion does not mean that you  need treatment. If one blood pressure reading is high, ask your health care provider about having it checked again. TREATMENT  Treating high blood pressure includes making lifestyle changes and possibly taking medicine. Living a healthy lifestyle can help lower high blood pressure. You may need to change some of your habits. Lifestyle changes may include:  Following the DASH diet. This diet is high in fruits, vegetables, and whole grains. It is low in salt, red meat, and added sugars.  Getting at least 2 hours of brisk physical activity every week.  Losing weight if necessary.  Not smoking.  Limiting alcoholic beverages.  Learning ways to reduce stress. If lifestyle changes are not enough to get your blood pressure under control, your health care provider may prescribe medicine. You may need to take more than one. Work closely with your health care provider to understand the risks and benefits. HOME CARE INSTRUCTIONS  Have your blood pressure rechecked as directed by your health care provider.   Take medicines only as directed by your health care provider. Follow the directions carefully. Blood pressure medicines must be taken as prescribed. The medicine does not work as well when you skip doses. Skipping doses also puts you at risk for problems.   Do not smoke.   Monitor your blood pressure at home as directed by your health care provider. SEEK MEDICAL CARE IF:   You think you are having a reaction to medicines taken.  You have recurrent headaches or feel dizzy.  You have swelling in your ankles.  You have trouble with your vision. [place]  CARE IF:  You develop a severe headache or confusion.  You have unusual weakness, numbness, or feel faint.  You have severe chest or abdominal pain.  You vomit repeatedly.  You have trouble breathing. MAKE SURE YOU:   Understand these instructions.  Will watch your condition.  Will get help right away if you  are not doing well or get worse. Document Released: 08/29/2005 Document Revised: 01/13/2014 Document Reviewed: 06/21/2013 [facility] Patient Information 2015 [place], [place]. This information is not intended to replace advice given to you by your health care provider. Make sure you discuss any questions you have with your health care provider.

## 2015-04-24 NOTE — Progress Notes (Signed)
Subjective:  This chart was scribed for Reginia Forts, MD by Thea Alken, ED Scribe. This patient was seen in room 1 and the patient's care was started at 10:46 AM.   Patient ID: Lori Cook, female    DOB: 11/18/1950, 64 y.o.   MRN: [id number]  HPI   Chief Complaint  Patient presents with  . Hypertension    Recheck  . Cough  . Rib Injury    [place] last week, left side pain   HPI Comments: Lori Cook is a 64 y.o.  who presents to the [facility] for HTN follow up.  Pt was seen here 6 weeks ago by Dr. Laney Pastor for new onset of HTN. Baseline labs at that Visit. She was started on lisinopril. Pt has had persistent, non productive, cough since being started on lisinopril medication. Per telephone note 7/05, lisinopril was discontinued and started on HCTZ 12.5 mg. She is tolerating medication well, but still has cough. No sick contacts. She denies fever, chills, diaphoresis, sneezing, sore throat, ear pain, SOB. She denies hx of asthma. Pt checks BP at home which have been, 128/80, but has not checked it regularly this week. Pt states she often clears her throat due to having a dry mouth. Is currently taking zyrtec for seasonal allergies. Home systolic readings 672-094.  Pt also c/o of a fall. Pt was walking out of an apartment, stepped over trash, and fell backwards. She now has pain to left rib. She denies neck pain,CP, HA, dizziness, SOB, nausea, emesis.  Pt has hx of retinal vein occlusion branch, appendectomy, spine surgery. Pt had colonoscopy at [facility] in [place], was told to repeat in 10 years at that time. She is UTD with mammogram.  Pt states her mother 11 and live in [place]. Her father passed at 55 from a stroke with hx of MI in late 32 and DM. Her sister also has HTN.  Pt is single with 2 daughter and 1 deceased son. She has a cleaning business. Pt lives with her daughter. She denies exercising. She has been taking simvastatin for 8 years.      Past Medical History  Diagnosis Date  . Hyperlipidemia   . Blood transfusion without reported diagnosis   . Allergy   . Stroke   . Hypertension   . Retinal vein occlusion, 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   Prior to Admission medications   Medication Sig Start Date End Date Taking? Authorizing Provider  acetaminophen (TYLENOL) 500 MG chewable tablet Chew 1,000 mg by mouth every 6 (six) hours as needed for pain.   Yes Historical Provider, MD  hydrochlorothiazide (MICROZIDE) 12.5 MG capsule Take 1 capsule (12.5 mg total) by mouth daily. 03/18/15  Yes Leandrew Koyanagi, MD  naproxen (NAPROSYN) 500 MG tablet Take 500 mg by mouth 2 (two) times daily with a meal.   Yes Historical Provider, MD  simvastatin (ZOCOR) 40 MG tablet Take 1 tablet (40 mg total) by mouth at bedtime. 11/26/14  Yes Darreld Mclean, MD   Social History   Social History  . Marital Status: Single    Spouse Name: N/A  . Number of Children: N/A  . Years of Education: N/A   Occupational History  . Not on file.   Social History Main Topics  . Smoking status: Former Smoker    Quit date: 09/08/2010  . Smokeless tobacco: Not on file  . Alcohol Use: No  . Drug Use: No  . Sexual Activity: Not on  file   Other Topics Concern  . Not on file   Social History Narrative   Marital status: single     Children: 2 daughters, 1 son passed away 40.43 years old.      Lives: with daughter      Employment:  Education administrator business full time      Tobacco: quit smoking      Alcohol:  None      Exercise: no formal exercise   Family History  Problem Relation Age of Onset  . Stroke Father   . Diabetes Father   . Heart disease Father 65    AMI  . Heart disease Paternal Uncle   . Hypertension Sister   . Arthritis Brother     Review of Systems  Constitutional: Negative for fever, chills, diaphoresis and fatigue.  HENT: Negative for congestion, ear pain, postnasal drip, sneezing, sore throat and trouble swallowing.   Eyes: Negative for visual  disturbance.  Respiratory: Positive for cough. Negative for shortness of breath.   Cardiovascular: Negative for chest pain, palpitations and leg swelling.  Gastrointestinal: Negative for nausea, vomiting, abdominal pain, diarrhea and constipation.  Endocrine: Negative for cold intolerance, heat intolerance, polydipsia, polyphagia and polyuria.  Musculoskeletal: Positive for myalgias and arthralgias. Negative for back pain, gait problem, neck pain and neck stiffness.  Skin: Negative for wound.  Neurological: Negative for dizziness, tremors, seizures, syncope, facial asymmetry, speech difficulty, weakness, light-headedness, numbness and headaches.   Objective:]   Physical Exam  Constitutional: She is oriented to person, place, and time. She appears well-developed and well-nourished. No distress.  HENT:  Head: Normocephalic and atraumatic.  Right Ear: External ear normal.  Left Ear: External ear normal.  Nose: Nose normal.  Mouth/Throat: Oropharynx is clear and moist.  Eyes: Conjunctivae and EOM are normal. Pupils are equal, round, and reactive to light.  Neck: Normal range of motion. Neck supple. Carotid bruit is not present. No thyromegaly present.  Cardiovascular: Normal rate, regular rhythm, normal heart sounds and intact distal pulses.  Exam reveals no gallop and no friction rub.   No murmur heard. Pulmonary/Chest: Effort normal and breath sounds normal. She has no wheezes. She has no rales.  Lateral distal left rib pain.  Abdominal: Soft. Bowel sounds are normal. She exhibits no distension and no mass. There is no tenderness. There is no rebound and no guarding.  Musculoskeletal: Normal range of motion.       Right shoulder: Normal.       Left shoulder: Normal. She exhibits normal range of motion.       Cervical back: Normal. She exhibits normal range of motion, no tenderness and no bony tenderness.       Thoracic back: She exhibits tenderness and pain. She exhibits normal range of  motion and no bony tenderness.  Lymphadenopathy:    She has no cervical adenopathy.  Neurological: She is alert and oriented to person, place, and time. No cranial nerve deficit.  Skin: Skin is warm and dry. No rash noted. She is not diaphoretic. No erythema. No pallor.  Psychiatric: She has a normal mood and affect. Her behavior is normal.  Nursing note and vitals reviewed.  [facility] reading (PRIMARY) by Dr. Tamala Julian.  L RIB FILMS: NAD.   EKG: sinus bradycardia; no ST changes. Assessment & Plan:  Essential hypertension, benign - Plan: CBC with Differential/Platelet, Comprehensive metabolic panel, EKG 84-ZYSA, TSH  Pure hypercholesterolemia - Plan: CBC with Differential/Platelet, Comprehensive metabolic panel, Lipid panel  Rib pain on left  side - Plan: DG Ribs Unilateral W/Chest Left  Cough  Retinal vein occlusion, branch, unspecified laterality  Dry mouth - Plan: TSH   1. HTN: improved but borderline readings; no changes in management at this time; obtain labs; RTC four months for repeat. 2.  Hypercholesterolemia: controlled; obtain labs; continue current medications. 3.  Rib pain L/contusion L rib: New.  No evidence of fracture; local care with rest, stretching, OTC medications. 4.  Cough: New.  No improvement with stopping ACE-I; treat with Omeprazole for GERD; currently taking Zyrtec for allergic rhinitis.  Call in one month if cough persists, will treat with Zpack and add Flonase. 5. Retinal vein occlusion: stable; followed by retinal specialist; warrants aggressive control of blood pressure and lipids; no evidence of DMII. 6. Dry mouth: chronic; clears throat repetitively during visit; will add PPI to treat any underlying GERD that is contributing to throat clearing.   Meds ordered this encounter  Medications  . acetaminophen (TYLENOL) 500 MG chewable tablet    Sig: Chew 1,000 mg by mouth every 6 (six) hours as needed for pain.    I personally performed the services described in  this documentation, which was scribed in my presence. The recorded information has been reviewed and considered.  Symon Norwood Elayne Guerin, M.D. [facility] [address] [place], [place]  [address] [phone] phone [phone] fax

## 2015-05-23 ENCOUNTER — Other Ambulatory Visit: Payer: Self-pay | Admitting: Family Medicine

## 2015-05-23 ENCOUNTER — Telehealth: Payer: Self-pay

## 2015-05-23 NOTE — Telephone Encounter (Signed)
PATIENT STATES SHE SAW DR. Tamala Julian ABOUT A MONTH AGO FOR HER HIGH BLOOD PRESSURE. DR. Tamala Julian TOLD HER THAT SHE MAY NEED TO CHANGE HER MEDICINE. SHE TOOK HER BP ON Friday 05/22/2015 AT 6:15 PM AND IT WAS 141/86 AND HER HEART RATE WAS 90. SHE TOOK IT AGAIN ON Friday LATER IN THE AFTERNOON AND IT WAS 131/81 AND HER HEART RATE WAS 92. Saturday 05/23/2015 AT 7:00 AM HER BP WAS 135/88  AND HER HEART RATE WAS 85. SHE WOULD LIKE TO KNOW IF DR. Tamala Julian WILL CHANGE HER MEDICATION OR KEEP IT THE SAME? BEST PHONE [phone] (CELL) PHARMACY CHOICE IS [facility]   [place]

## 2015-05-26 ENCOUNTER — Telehealth: Payer: Self-pay | Admitting: *Deleted

## 2015-05-26 DIAGNOSIS — R05 Cough: Secondary | ICD-10-CM

## 2015-05-26 DIAGNOSIS — R059 Cough, unspecified: Secondary | ICD-10-CM

## 2015-05-26 MED ORDER — HYDROCHLOROTHIAZIDE 25 MG PO TABS: 25.0000 mg | ORAL_TABLET | Freq: Every day | ORAL | Status: DC

## 2015-05-26 NOTE — Telephone Encounter (Signed)
Dr. Monico Hoar would like to know if you want her to continue Prilosec.  She states she feels much better, but she is still coughing a little bit

## 2015-05-26 NOTE — Telephone Encounter (Signed)
Recommend pt increase Hydrochlorothiazide 12.5mg  to two tablets daily until current prescription is gone.  I have sent in a new prescription for 25mg  tablets to start once she finishes her current rx.

## 2015-05-26 NOTE — Telephone Encounter (Signed)
How much improved is the cough?  If cough mostly improved, would continue Prilosec and Zyrtec daily for an additional month.   My goal is for the cough to completely resolve.  If cough is only 25-50% improved, please let me know.

## 2015-05-27 MED ORDER — OMEPRAZOLE 20 MG PO CPDR: 20.0000 mg | DELAYED_RELEASE_CAPSULE | Freq: Every day | ORAL | Status: DC

## 2015-05-27 NOTE — Telephone Encounter (Signed)
Spoke with pt, she ran out of the Prilosec so I sent in another Rx. I advised her to continue this medication and see if her cough completely goes away. I also advised her to take the Zyrtec. Pt understood.

## 2015-06-15 NOTE — Telephone Encounter (Signed)
Spoke with pt, pt is taking 25 mg.

## 2015-07-28 ENCOUNTER — Other Ambulatory Visit: Payer: Self-pay | Admitting: Family Medicine

## 2015-07-28 NOTE — Telephone Encounter (Signed)
Spoke to patient she states she is still coughing, she thinks the prilosec helped and wanted to know if she should start it up again?

## 2015-07-29 NOTE — Telephone Encounter (Signed)
Called and advised pt. She agreed to restart the prilosec and come to walk in to see Dr Tamala Julian in Dec.

## 2015-07-29 NOTE — Telephone Encounter (Signed)
Call --- Yes, continue Prilosec for another month; I sent in refill; patient will be due in one month for four month follow-up.  Please schedule OV.

## 2015-11-28 ENCOUNTER — Ambulatory Visit (INDEPENDENT_AMBULATORY_CARE_PROVIDER_SITE_OTHER): Payer: BLUE CROSS/BLUE SHIELD | Admitting: Family Medicine

## 2015-11-28 VITALS — BP 130/72 | HR 58 | Temp 98.2°F | Resp 20 | Ht 67.0 in | Wt 156.6 lb

## 2015-11-28 DIAGNOSIS — M25551 Pain in right hip: Secondary | ICD-10-CM

## 2015-11-28 DIAGNOSIS — M5431 Sciatica, right side: Secondary | ICD-10-CM

## 2015-11-28 DIAGNOSIS — R3129 Other microscopic hematuria: Secondary | ICD-10-CM

## 2015-11-28 DIAGNOSIS — I10 Essential (primary) hypertension: Secondary | ICD-10-CM

## 2015-11-28 DIAGNOSIS — E785 Hyperlipidemia, unspecified: Secondary | ICD-10-CM | POA: Diagnosis not present

## 2015-11-28 LAB — LIPID PANEL
CHOLESTEROL: 168 mg/dL (ref 125–200)
HDL: 61 mg/dL (ref >=46)
LDL CALC: 84 mg/dL (ref <130)
TRIGLYCERIDES: 114 mg/dL (ref <150)
Total CHOL/HDL Ratio: 2.8 Ratio (ref <=5.0)
VLDL: 23 mg/dL (ref <30)

## 2015-11-28 LAB — COMPLETE METABOLIC PANEL WITH GFR
ALBUMIN: 4.7 g/dL (ref 3.6–5.1)
ALK PHOS: 73 U/L (ref 33–130)
ALT: 16 U/L (ref 6–29)
AST: 18 U/L (ref 10–35)
BUN: 21 mg/dL (ref 7–25)
CALCIUM: 10.1 mg/dL (ref 8.6–10.4)
CHLORIDE: 101 mmol/L (ref 98–110)
CO2: 29 mmol/L (ref 20–31)
CREATININE: 0.87 mg/dL (ref 0.50–0.99)
GFR, Est African American: 82 mL/min (ref >=60)
GFR, Est Non African American: 71 mL/min (ref >=60)
GLUCOSE: 96 mg/dL (ref 65–99)
Potassium: 3.9 mmol/L (ref 3.5–5.3)
SODIUM: 139 mmol/L (ref 135–146)
Total Bilirubin: 0.7 mg/dL (ref 0.2–1.2)
Total Protein: 7.4 g/dL (ref 6.1–8.1)

## 2015-11-28 LAB — POCT URINALYSIS DIP (MANUAL ENTRY)
BILIRUBIN UA: NEGATIVE
BILIRUBIN UA: NEGATIVE
Glucose, UA: NEGATIVE
Nitrite, UA: NEGATIVE
PROTEIN UA: NEGATIVE
SPEC GRAV UA: 1.02
Urobilinogen, UA: 0.2
pH, UA: 6

## 2015-11-28 LAB — POC MICROSCOPIC URINALYSIS (UMFC)

## 2015-11-28 MED ORDER — HYDROCHLOROTHIAZIDE 25 MG PO TABS: 25.0000 mg | ORAL_TABLET | Freq: Every day | ORAL | Status: DC

## 2015-11-28 MED ORDER — OMEPRAZOLE 20 MG PO CPDR: 20.0000 mg | DELAYED_RELEASE_CAPSULE | Freq: Every day | ORAL | Status: DC

## 2015-11-28 MED ORDER — SIMVASTATIN 40 MG PO TABS
40.0000 mg | ORAL_TABLET | Freq: Every day | ORAL | Status: DC
Start: 2015-11-28 — End: 2016-05-07

## 2015-11-28 NOTE — Patient Instructions (Addendum)
IF you received an x-ray today, you will receive an invoice from [facility]. Please contact [facility] at [phone] with questions or concerns regarding your invoice.   IF you received labwork today, you will receive an invoice from [employer]. Please contact [facility] at [phone] with questions or concerns regarding your invoice.   Our billing staff will not be able to assist you with questions regarding bills from these companies.  You will be contacted with the lab results as soon as they are available. The fastest way to get your results is to activate your My Chart account. Instructions are located on the last page of this paperwork. If you have not heard from [place] regarding the results in 2 weeks, please contact this office.     For cough/heartburn - avoid foods below that can cause heartburn, then zantac if needed ovre the counter. If zantac not working - can restart omeprazole up to once per day.  Your hip pain may be due to sciatica or possible arthritis in the hip. Without an injury, we can hold off on x-ray today, try Tylenol over-the-counter or rare Aleve only if needed. If the pain is not significantly better in the next 2-3 weeks, return for possible x-rays. I did include some information below on sciatica.  I will check the lab work for your calcium, and other blood tests, and let you know if there are any concerns.  I will refer you to the urologist to determine if other testing needed for the blood in your urine.  Return to the clinic or go to the nearest emergency room if any of your symptoms worsen or new symptoms occur.      Food Choices for Gastroesophageal Reflux Disease, Adult When you have gastroesophageal reflux disease (GERD), the foods you eat and your eating habits are very important. Choosing the right foods can help ease the discomfort of GERD. WHAT GENERAL GUIDELINES DO I NEED TO FOLLOW?  Choose  fruits, vegetables, whole grains, low-fat dairy products, and low-fat meat, fish, and poultry.  Limit fats such as oils, salad dressings, butter, nuts, and avocado.  Keep a food diary to identify foods that cause symptoms.  Avoid foods that cause reflux. These may be different for different people.  Eat frequent small meals instead of three large meals each day.  Eat your meals slowly, in a relaxed setting.  Limit fried foods.  Cook foods using methods other than frying.  Avoid drinking alcohol.  Avoid drinking large amounts of liquids with your meals.  Avoid bending over or lying down until 2-3 hours after eating. WHAT FOODS ARE NOT RECOMMENDED? The following are some foods and drinks that may worsen your symptoms: Vegetables Tomatoes. Tomato juice. Tomato and spaghetti sauce. Chili peppers. Onion and garlic. Horseradish. Fruits Oranges, grapefruit, and lemon (fruit and juice). Meats High-fat meats, fish, and poultry. This includes hot dogs, ribs, ham, sausage, salami, and bacon. Dairy Whole milk and chocolate milk. Sour cream. Cream. Butter. Ice cream. Cream cheese.  Beverages Coffee and tea, with or without caffeine. Carbonated beverages or energy drinks. Condiments Hot sauce. Barbecue sauce.  Sweets/Desserts Chocolate and cocoa. Donuts. Peppermint and spearmint. Fats and Oils High-fat foods, including [place] fries and potato chips. Other Vinegar. Strong spices, such as black pepper, white pepper, red pepper, cayenne, curry powder, cloves, ginger, and chili powder. The items listed above may not be a complete list of foods and beverages to avoid. Contact your dietitian for more  information.   This information is not intended to replace advice given to you by your health care provider. Make sure you discuss any questions you have with your health care provider.   Document Released: 08/29/2005 Document Revised: 09/19/2014 Document Reviewed: 07/03/2013 Elsevier  Interactive Patient Education 2016 Else[employer] Inc.  Sciatica Sciatica is pain, weakness, numbness, or tingling along the path of the sciatic nerve. The nerve starts in the lower back and runs down the back of each leg. The nerve controls the muscles in the lower leg and in the back of the knee, while also providing sensation to the back of the thigh, lower leg, and the sole of your foot. Sciatica is a symptom of another medical condition. For instance, nerve damage or certain conditions, such as a herniated disk or bone spur on the spine, pinch or put pressure on the sciatic nerve. This causes the pain, weakness, or other sensations normally associated with sciatica. Generally, sciatica only affects one side of the body. CAUSES   Herniated or slipped disc.  Degenerative disk disease.  A pain disorder involving the narrow muscle in the buttocks (piriformis syndrome).  Pelvic injury or fracture.  Pregnancy.  Tumor (rare). SYMPTOMS  Symptoms can vary from mild to very severe. The symptoms usually travel from the low back to the buttocks and down the back of the leg. Symptoms can include:  Mild tingling or dull aches in the lower back, leg, or hip.  Numbness in the back of the calf or sole of the foot.  Burning sensations in the lower back, leg, or hip.  Sharp pains in the lower back, leg, or hip.  Leg weakness.  Severe back pain inhibiting movement. These symptoms may get worse with coughing, sneezing, laughing, or prolonged sitting or standing. Also, being overweight may worsen symptoms. DIAGNOSIS  Your caregiver will perform a physical exam to look for common symptoms of sciatica. He or she may ask you to do certain movements or activities that would trigger sciatic nerve pain. Other tests may be performed to find the cause of the sciatica. These may include:  Blood tests.  X-rays.  Imaging tests, such as an MRI or CT scan. TREATMENT  Treatment is directed at the cause of the  sciatic pain. Sometimes, treatment is not necessary and the pain and discomfort goes away on its own. If treatment is needed, your caregiver may suggest:  Over-the-counter medicines to relieve pain.  Prescription medicines, such as anti-inflammatory medicine, muscle relaxants, or narcotics.  Applying heat or ice to the painful area.  Steroid injections to lessen pain, irritation, and inflammation around the nerve.  Reducing activity during periods of pain.  Exercising and stretching to strengthen your abdomen and improve flexibility of your spine. Your caregiver may suggest losing weight if the extra weight makes the back pain worse.  Physical therapy.  Surgery to eliminate what is pressing or pinching the nerve, such as a bone spur or part of a herniated disk. HOME CARE INSTRUCTIONS   Only take over-the-counter or prescription medicines for pain or discomfort as directed by your caregiver.  Apply ice to the affected area for 20 minutes, 3-4 times a day for the first 48-72 hours. Then try heat in the same way.  Exercise, stretch, or perform your usual activities if these do not aggravate your pain.  Attend physical therapy sessions as directed by your caregiver.  Keep all follow-up appointments as directed by your caregiver.  Do not wear high heels or shoes that  do not provide proper support.  Check your mattress to see if it is too soft. A firm mattress may lessen your pain and discomfort. SEEK IMMEDIATE MEDICAL CARE IF:   You lose control of your bowel or bladder (incontinence).  You have increasing weakness in the lower back, pelvis, buttocks, or legs.  You have redness or swelling of your back.  You have a burning sensation when you urinate.  You have pain that gets worse when you lie down or awakens you at night.  Your pain is worse than you have experienced in the past.  Your pain is lasting longer than 4 weeks.  You are suddenly losing weight without  reason. MAKE SURE YOU:  Understand these instructions.  Will watch your condition.  Will get help right away if you are not doing well or get worse.   This information is not intended to replace advice given to you by your health care provider. Make sure you discuss any questions you have with your health care provider.   Document Released: 08/23/2001 Document Revised: 05/20/2015 Document Reviewed: 01/08/2012 Elsevier Interactive Patient Education 2016 Elsevier Inc.  Hip Pain Your hip is the joint between your upper legs and your lower pelvis. The bones, cartilage, tendons, and muscles of your hip joint perform a lot of work each day supporting your body weight and allowing you to move around. Hip pain can range from a minor ache to severe pain in one or both of your hips. Pain may be felt on the inside of the hip joint near the groin, or the outside near the buttocks and upper thigh. You may have swelling or stiffness as well.  HOME CARE INSTRUCTIONS   Take medicines only as directed by your health care provider.  Apply ice to the injured area:  Put ice in a plastic bag.  Place a towel between your skin and the bag.  Leave the ice on for 15-20 minutes at a time, 3-4 times a day.  Keep your leg raised (elevated) when possible to lessen swelling.  Avoid activities that cause pain.  Follow specific exercises as directed by your health care provider.  Sleep with a pillow between your legs on your most comfortable side.  Record how often you have hip pain, the location of the pain, and what it feels like. SEEK MEDICAL CARE IF:   You are unable to put weight on your leg.  Your hip is red or swollen or very tender to touch.  Your pain or swelling continues or worsens after 1 week.  You have increasing difficulty walking.  You have a fever. SEEK IMMEDIATE MEDICAL CARE IF:   You have fallen.  You have a sudden increase in pain and swelling in your hip. MAKE SURE YOU:    Understand these instructions.  Will watch your condition.  Will get help right away if you are not doing well or get worse.   This information is not intended to replace advice given to you by your health care provider. Make sure you discuss any questions you have with your health care provider.   Document Released: 02/16/2010 Document Revised: 09/19/2014 Document Reviewed: 04/25/2013 Elsevier Interactive Patient Education [employer].

## 2015-11-28 NOTE — Progress Notes (Addendum)
Subjective:    Patient ID: Lori Cook, female    DOB: 1950-12-23, 65 y.o.   MRN: [id number] By signing my name below, I, Lori Cook, attest that this documentation has been prepared under the direction and in the presence of Lori Ray, MD. Electronically Signed: Judithe Cook, ER Scribe. 11/28/2015. 10:30 AM.  Chief Complaint  Patient presents with  . Medication Refill    prilosec, zocor  . Hip Pain    right side    HPI HPI Comments: [name] is a 65 y.o. female who presents to [facility] reporting for a medication refill, and complaining of right hip pain for the last two weeks. Her PCP was previously Dr. Everlene Farrier, but he is retiring. She denies CP, SOB, myalgias or blood in stool. She checks her BP at home, and it has been around 135/84. She had one reading over 140/85. She has not changed her eating or exercise regimen.   Hip Pain: She initially had left hip pain starting two months ago, but that spontaneously resolved. Two weeks ago her left hip began hurting. She has not had any falls, but last night she twisted while she was washing dishes and had a sudden stab of pain in her right hip.   Refill for Prilosec: She takes Prilosec intermittently, due to persistent cough when she does not take the medication. She has not taken it in 2-3 days and her cough has not recurred. Possible reflux causing cough.   HLD: Lab Results  Component Value Date   CHOL 186 04/24/2015   HDL 51 04/24/2015   LDLCALC 90 04/24/2015   TRIG 223* 04/24/2015   CHOLHDL 3.6 04/24/2015   Lab Results  Component Value Date   ALT 18 04/24/2015   AST 21 04/24/2015   ALKPHOS 94 04/24/2015   BILITOT 0.5 04/24/2015   Weight loss, exercise and diet changes recommended at last visit. She is on zocor.  Wt Readings from Last 3 Encounters:  11/28/15 156 lb 9.6 oz (71.033 kg)  04/24/15 160 lb (72.576 kg)  03/08/15 162 lb (73.483 kg)   Hematuria: Small/microscopic noted March 08, 2015. Will  repeat UA today.   HTN: HCTZ 25mg  QD.  Hypercalcemia: Calcemia 10.5 last visit, normal 8 mos ago.   Patient Active Problem List   Diagnosis Date Noted  . Essential hypertension, benign 04/24/2015  . Allergic rhinitis due to pollen 04/24/2015  . Retinal vein occlusion, branch 04/24/2015  . Retinal vein occlusion 03/08/2015  . Essential hypertension-new onset 03/08/2015  . Hyperlipidemia LDL goal < 130 09/09/2012   Past Medical History  Diagnosis Date  . Hyperlipidemia   . Blood transfusion without reported diagnosis   . Allergy   . Stroke ([place])   . Hypertension   . Retinal vein occlusion, 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   Past Surgical History  Procedure Laterality Date  . Appendectomy    . Spine surgery    . Abdominal hysterectomy     Allergies  Allergen Reactions  . Codeine Other (See Comments)    Headache   Prior to Admission medications   Medication Sig Start Date End Date Taking? Authorizing Provider  Aflibercept (EYLEA IO) Inject into the eye.   Yes Historical Provider, MD  hydrochlorothiazide (HYDRODIURIL) 25 MG tablet Take 1 tablet (25 mg total) by mouth daily. 05/26/15  Yes Wardell Honour, MD  naproxen (NAPROSYN) 500 MG tablet Take 500 mg by mouth 2 (two) times daily with a meal.   Yes Historical Provider, MD  simvastatin (ZOCOR) 40 MG tablet Take 1 tablet (40 mg total) by mouth at bedtime. 11/26/14  Yes Gay Filler Copland, MD  acetaminophen (TYLENOL) 500 MG chewable tablet Chew 1,000 mg by mouth every 6 (six) hours as needed for pain. Reported on 11/28/2015    Historical Provider, MD  omeprazole (PRILOSEC) 20 MG capsule TAKE ONE CAPSULE BY MOUTH ONCE DAILY 07/29/15   Wardell Honour, MD   Social History   Social History  . Marital Status: Single    Spouse Name: N/A  . Number of Children: N/A  . Years of Education: N/A   Occupational History  . Not on file.   Social History Main Topics  . Smoking status: Former Smoker    Quit date: 09/08/2010  . Smokeless tobacco: Not  on file  . Alcohol Use: No  . Drug Use: No  . Sexual Activity: Not on file   Other Topics Concern  . Not on file   Social History Narrative   Marital status: single     Children: 2 daughters, 1 son passed away 64.25 years old.      Lives: with daughter      Employment:  Education administrator business full time      Tobacco: quit smoking      Alcohol:  None      Exercise: no formal exercise    Review of Systems  Constitutional: Negative for fatigue and unexpected weight change.  Respiratory: Negative for chest tightness and shortness of breath.   Cardiovascular: Negative for chest pain, palpitations and leg swelling.  Gastrointestinal: Negative for abdominal pain and blood in stool.  Neurological: Negative for dizziness, syncope, light-headedness and headaches.      Objective:  BP 130/72 mmHg  Pulse 58  Temp(Src) 98.2 F (36.8 C) (Oral)  Resp 20  Ht 5\' 7"  (1.702 m)  Wt 156 lb 9.6 oz (71.033 kg)  BMI 24.52 kg/m2  SpO2 99%  Physical Exam  Constitutional: She is oriented to person, place, and time. She appears well-developed and well-nourished. No distress.  HENT:  Head: Normocephalic and atraumatic.  Eyes: Pupils are equal, round, and reactive to light.  Neck: Neck supple.  Cardiovascular: Normal rate, regular rhythm and normal heart sounds.   No murmur heard. Pulmonary/Chest: Effort normal and breath sounds normal. No respiratory distress. She has no wheezes.  Musculoskeletal: Normal range of motion.  Minimal discomfort with internal rotation. Trochanteric bursa non tender. Slight tenderness in posterior his and sciatic notch. Full ROM of lumbar spine. Lumbar spine is non tender. Reflexes 2+ at patella and achilles. Babinski negative.   Neurological: She is alert and oriented to person, place, and time. Coordination normal.  Skin: Skin is warm and dry. She is not diaphoretic.  Psychiatric: She has a normal mood and affect. Her behavior is normal.  Nursing note and vitals  reviewed.   Results for orders placed or performed in visit on 11/28/15  POCT urinalysis dipstick  Result Value Ref Range   Color, UA yellow yellow   Clarity, UA clear clear   Glucose, UA negative negative   Bilirubin, UA negative negative   Ketones, POC UA negative negative   Spec Grav, UA 1.020    Blood, UA trace-lysed (A) negative   pH, UA 6.0    Protein Ur, POC negative negative   Urobilinogen, UA 0.2    Nitrite, UA Negative Negative   Leukocytes, UA Trace (A) Negative  POCT Microscopic Urinalysis ([facility])  Result Value Ref Range   WBC,UR,HPF,POC  Few (A) None WBC/hpf   RBC,UR,HPF,POC Few (A) None RBC/hpf   Bacteria Few (A) None, Too numerous to count   Mucus Present (A) Absent   Epithelial Cells, UR Per Microscopy Few (A) None, Too numerous to count cells/hpf       Assessment & Plan:   Lismarie Fielding is a 65 y.o. female Essential hypertension - Plan: COMPLETE METABOLIC PANEL WITH GFR  - stable, continue same dose of medications for now. Labs pending  Hyperlipidemia - Plan: COMPLETE METABOLIC PANEL WITH GFR, Lipid panel  - Continue Zocor, labs pending.  Microscopic hematuria - Plan: POCT urinalysis dipstick, POCT Microscopic Urinalysis ([facility]), Ambulatory referral to Urology  - Asymptomatic, history of tobacco abuse. Will refer to urology to determine if further evaluation.  Hypercalcemia - Plan: COMPLETE METABOLIC PANEL WITH GFR  - Repeat CMP  Right hip pain Sciatica of right side  -Suspected lumbar source/sciatica with posterior pain. Possible degenerative joint disease of the right hip, no injury. Decided against x-rays today, trial Tylenol, or if NSAID needed, short course discussed with cardiac risks. Follow the next 2-3 weeks if not significantly improved for x-rays, sooner if worse.  Heartburn/Reflux. Discussed trigger avoidance, list of foods given. Zantac over-the-counter if needed, or if breakthrough symptoms can take PPI, but long-term risks were  discussed.  Meds ordered this encounter  Medications  . Aflibercept (EYLEA IO)    Sig: Inject into the eye.   Patient Instructions       IF you received an x-Cook today, you will receive an invoice from [facility]. Please contact [facility] at [phone] with questions or concerns regarding your invoice.   IF you received labwork today, you will receive an invoice from [employer]. Please contact [facility] at [phone] with questions or concerns regarding your invoice.   Our billing staff will not be able to assist you with questions regarding bills from these companies.  You will be contacted with the lab results as soon as they are available. The fastest way to get your results is to activate your My Chart account. Instructions are located on the last page of this paperwork. If you have not heard from [place] regarding the results in 2 weeks, please contact this office.     For cough/heartburn - avoid foods below that can cause heartburn, then zantac if needed ovre the counter. If zantac not working - can restart omeprazole up to once per day.  Your hip pain may be due to sciatica or possible arthritis in the hip. Without an injury, we can hold off on x-Cook today, try Tylenol over-the-counter or rare Aleve only if needed. If the pain is not significantly better in the next 2-3 weeks, return for possible x-rays. I did include some information below on sciatica.  I will check the lab work for your calcium, and other blood tests, and let you know if there are any concerns.  I will refer you to the urologist to determine if other testing needed for the blood in your urine.  Return to the clinic or go to the nearest emergency room if any of your symptoms worsen or new symptoms occur.      Food Choices for Gastroesophageal Reflux Disease, Adult When you have gastroesophageal reflux disease (GERD), the foods you eat and your eating habits  are very important. Choosing the right foods can help ease the discomfort of GERD. WHAT GENERAL GUIDELINES DO I NEED TO FOLLOW?  Choose fruits, vegetables, whole grains, low-fat  dairy products, and low-fat meat, fish, and poultry.  Limit fats such as oils, salad dressings, butter, nuts, and avocado.  Keep a food diary to identify foods that cause symptoms.  Avoid foods that cause reflux. These may be different for different people.  Eat frequent small meals instead of three large meals each day.  Eat your meals slowly, in a relaxed setting.  Limit fried foods.  Cook foods using methods other than frying.  Avoid drinking alcohol.  Avoid drinking large amounts of liquids with your meals.  Avoid bending over or lying down until 2-3 hours after eating. WHAT FOODS ARE NOT RECOMMENDED? The following are some foods and drinks that may worsen your symptoms: Vegetables Tomatoes. Tomato juice. Tomato and spaghetti sauce. Chili peppers. Onion and garlic. Horseradish. Fruits Oranges, grapefruit, and lemon (fruit and juice). Meats High-fat meats, fish, and poultry. This includes hot dogs, ribs, ham, sausage, salami, and bacon. Dairy Whole milk and chocolate milk. Sour cream. Cream. Butter. Ice cream. Cream cheese.  Beverages Coffee and tea, with or without caffeine. Carbonated beverages or energy drinks. Condiments Hot sauce. Barbecue sauce.  Sweets/Desserts Chocolate and cocoa. Donuts. Peppermint and spearmint. Fats and Oils High-fat foods, including [place] fries and potato chips. Other Vinegar. Strong spices, such as black pepper, white pepper, red pepper, cayenne, curry powder, cloves, ginger, and chili powder. The items listed above may not be a complete list of foods and beverages to avoid. Contact your dietitian for more information.   This information is not intended to replace advice given to you by your health care provider. Make sure you discuss any questions you have with  your health care provider.   Document Released: 08/29/2005 Document Revised: 09/19/2014 Document Reviewed: 07/03/2013 Elsevier Interactive Patient Education 2016 Elsevier Inc.  Sciatica Sciatica is pain, weakness, numbness, or tingling along the path of the sciatic nerve. The nerve starts in the lower back and runs down the back of each leg. The nerve controls the muscles in the lower leg and in the back of the knee, while also providing sensation to the back of the thigh, lower leg, and the sole of your foot. Sciatica is a symptom of another medical condition. For instance, nerve damage or certain conditions, such as a herniated disk or bone spur on the spine, pinch or put pressure on the sciatic nerve. This causes the pain, weakness, or other sensations normally associated with sciatica. Generally, sciatica only affects one side of the body. CAUSES   Herniated or slipped disc.  Degenerative disk disease.  A pain disorder involving the narrow muscle in the buttocks (piriformis syndrome).  Pelvic injury or fracture.  Pregnancy.  Tumor (rare). SYMPTOMS  Symptoms can vary from mild to very severe. The symptoms usually travel from the low back to the buttocks and down the back of the leg. Symptoms can include:  Mild tingling or dull aches in the lower back, leg, or hip.  Numbness in the back of the calf or sole of the foot.  Burning sensations in the lower back, leg, or hip.  Sharp pains in the lower back, leg, or hip.  Leg weakness.  Severe back pain inhibiting movement. These symptoms may get worse with coughing, sneezing, laughing, or prolonged sitting or standing. Also, being overweight may worsen symptoms. DIAGNOSIS  Your caregiver will perform a physical exam to look for common symptoms of sciatica. He or she may ask you to do certain movements or activities that would trigger sciatic nerve pain. Other tests may  be performed to find the cause of the sciatica. These may  include:  Blood tests.  X-rays.  Imaging tests, such as an MRI or CT scan. TREATMENT  Treatment is directed at the cause of the sciatic pain. Sometimes, treatment is not necessary and the pain and discomfort goes away on its own. If treatment is needed, your caregiver may suggest:  Over-the-counter medicines to relieve pain.  Prescription medicines, such as anti-inflammatory medicine, muscle relaxants, or narcotics.  Applying heat or ice to the painful area.  Steroid injections to lessen pain, irritation, and inflammation around the nerve.  Reducing activity during periods of pain.  Exercising and stretching to strengthen your abdomen and improve flexibility of your spine. Your caregiver may suggest losing weight if the extra weight makes the back pain worse.  Physical therapy.  Surgery to eliminate what is pressing or pinching the nerve, such as a bone spur or part of a herniated disk. HOME CARE INSTRUCTIONS   Only take over-the-counter or prescription medicines for pain or discomfort as directed by your caregiver.  Apply ice to the affected area for 20 minutes, 3-4 times a day for the first 48-72 hours. Then try heat in the same way.  Exercise, stretch, or perform your usual activities if these do not aggravate your pain.  Attend physical therapy sessions as directed by your caregiver.  Keep all follow-up appointments as directed by your caregiver.  Do not wear high heels or shoes that do not provide proper support.  Check your mattress to see if it is too soft. A firm mattress may lessen your pain and discomfort. SEEK IMMEDIATE MEDICAL CARE IF:   You lose control of your bowel or bladder (incontinence).  You have increasing weakness in the lower back, pelvis, buttocks, or legs.  You have redness or swelling of your back.  You have a burning sensation when you urinate.  You have pain that gets worse when you lie down or awakens you at night.  Your pain is worse  than you have experienced in the past.  Your pain is lasting longer than 4 weeks.  You are suddenly losing weight without reason. MAKE SURE YOU:  Understand these instructions.  Will watch your condition.  Will get help right away if you are not doing well or get worse.   This information is not intended to replace advice given to you by your health care provider. Make sure you discuss any questions you have with your health care provider.   Document Released: 08/23/2001 Document Revised: 05/20/2015 Document Reviewed: 01/08/2012 Elsevier Interactive Patient Education 2016 Elsevier Inc.  Hip Pain Your hip is the joint between your upper legs and your lower pelvis. The bones, cartilage, tendons, and muscles of your hip joint perform a lot of work each day supporting your body weight and allowing you to move around. Hip pain can range from a minor ache to severe pain in one or both of your hips. Pain may be felt on the inside of the hip joint near the groin, or the outside near the buttocks and upper thigh. You may have swelling or stiffness as well.  HOME CARE INSTRUCTIONS   Take medicines only as directed by your health care provider.  Apply ice to the injured area:  Put ice in a plastic bag.  Place a towel between your skin and the bag.  Leave the ice on for 15-20 minutes at a time, 3-4 times a day.  Keep your leg raised (elevated) when  possible to lessen swelling.  Avoid activities that cause pain.  Follow specific exercises as directed by your health care provider.  Sleep with a pillow between your legs on your most comfortable side.  Record how often you have hip pain, the location of the pain, and what it feels like. SEEK MEDICAL CARE IF:   You are unable to put weight on your leg.  Your hip is red or swollen or very tender to touch.  Your pain or swelling continues or worsens after 1 week.  You have increasing difficulty walking.  You have a fever. SEEK  IMMEDIATE MEDICAL CARE IF:   You have fallen.  You have a sudden increase in pain and swelling in your hip. MAKE SURE YOU:   Understand these instructions.  Will watch your condition.  Will get help right away if you are not doing well or get worse.   This information is not intended to replace advice given to you by your health care provider. Make sure you discuss any questions you have with your health care provider.   Document Released: 02/16/2010 Document Revised: 09/19/2014 Document Reviewed: 04/25/2013 Elsevier Interactive Patient Education [employer].     I personally performed the services described in this documentation, which was scribed in my presence. The recorded information has been reviewed and considered, and addended by me as needed.

## 2016-05-07 ENCOUNTER — Ambulatory Visit (INDEPENDENT_AMBULATORY_CARE_PROVIDER_SITE_OTHER): Payer: BLUE CROSS/BLUE SHIELD | Admitting: Family Medicine

## 2016-05-07 ENCOUNTER — Encounter: Payer: Self-pay | Admitting: Family Medicine

## 2016-05-07 VITALS — BP 122/78 | HR 64 | Temp 97.8°F | Resp 18 | Ht 67.0 in | Wt 157.0 lb

## 2016-05-07 DIAGNOSIS — Z1159 Encounter for screening for other viral diseases: Secondary | ICD-10-CM

## 2016-05-07 DIAGNOSIS — R918 Other nonspecific abnormal finding of lung field: Secondary | ICD-10-CM | POA: Diagnosis not present

## 2016-05-07 DIAGNOSIS — N200 Calculus of kidney: Secondary | ICD-10-CM | POA: Diagnosis not present

## 2016-05-07 DIAGNOSIS — E785 Hyperlipidemia, unspecified: Secondary | ICD-10-CM

## 2016-05-07 DIAGNOSIS — Z131 Encounter for screening for diabetes mellitus: Secondary | ICD-10-CM | POA: Diagnosis not present

## 2016-05-07 DIAGNOSIS — K8020 Calculus of gallbladder without cholecystitis without obstruction: Secondary | ICD-10-CM | POA: Diagnosis not present

## 2016-05-07 DIAGNOSIS — H348192 Central retinal vein occlusion, unspecified eye, stable: Secondary | ICD-10-CM

## 2016-05-07 DIAGNOSIS — Z114 Encounter for screening for human immunodeficiency virus [HIV]: Secondary | ICD-10-CM | POA: Diagnosis not present

## 2016-05-07 DIAGNOSIS — I10 Essential (primary) hypertension: Secondary | ICD-10-CM

## 2016-05-07 DIAGNOSIS — Z23 Encounter for immunization: Secondary | ICD-10-CM

## 2016-05-07 DIAGNOSIS — R3129 Other microscopic hematuria: Secondary | ICD-10-CM | POA: Diagnosis not present

## 2016-05-07 DIAGNOSIS — H349 Unspecified retinal vascular occlusion: Secondary | ICD-10-CM

## 2016-05-07 DIAGNOSIS — J301 Allergic rhinitis due to pollen: Secondary | ICD-10-CM

## 2016-05-07 LAB — TSH: TSH: 2.48 mIU/L

## 2016-05-07 LAB — CBC WITH DIFFERENTIAL/PLATELET
BASOS ABS: 0 {cells}/uL (ref 0–200)
Basophils Relative: 0 %
EOS PCT: 2 %
Eosinophils Absolute: 148 cells/uL (ref 15–500)
HCT: 41.2 % (ref 35.0–45.0)
Hemoglobin: 14.2 g/dL (ref 11.7–15.5)
LYMPHS ABS: 2886 {cells}/uL (ref 850–3900)
LYMPHS PCT: 39 %
MCH: 30.5 pg (ref 27.0–33.0)
MCHC: 34.5 g/dL (ref 32.0–36.0)
MCV: 88.6 fL (ref 80.0–100.0)
MONOS PCT: 12 %
MPV: 12.8 fL — ABNORMAL HIGH (ref 7.5–12.5)
Monocytes Absolute: 888 cells/uL (ref 200–950)
NEUTROS PCT: 47 %
Neutro Abs: 3478 cells/uL (ref 1500–7800)
PLATELETS: 331 10*3/uL (ref 140–400)
RBC: 4.65 MIL/uL (ref 3.80–5.10)
RDW: 13.3 % (ref 11.0–15.0)
WBC: 7.4 10*3/uL (ref 3.8–10.8)

## 2016-05-07 LAB — POCT URINALYSIS DIP (MANUAL ENTRY)
BILIRUBIN UA: NEGATIVE
Bilirubin, UA: NEGATIVE
Glucose, UA: NEGATIVE
Leukocytes, UA: NEGATIVE
Nitrite, UA: NEGATIVE
PH UA: 7
PROTEIN UA: NEGATIVE
SPEC GRAV UA: 1.01
Urobilinogen, UA: 0.2

## 2016-05-07 LAB — LIPID PANEL
CHOL/HDL RATIO: 2.7 ratio (ref <=5.0)
Cholesterol: 165 mg/dL (ref 125–200)
HDL: 62 mg/dL (ref >=46)
LDL CALC: 78 mg/dL (ref <130)
TRIGLYCERIDES: 125 mg/dL (ref <150)
VLDL: 25 mg/dL (ref <30)

## 2016-05-07 LAB — COMPREHENSIVE METABOLIC PANEL
ALBUMIN: 4.8 g/dL (ref 3.6–5.1)
ALT: 16 U/L (ref 6–29)
AST: 17 U/L (ref 10–35)
Alkaline Phosphatase: 78 U/L (ref 33–130)
BUN: 14 mg/dL (ref 7–25)
CHLORIDE: 102 mmol/L (ref 98–110)
CO2: 29 mmol/L (ref 20–31)
CREATININE: 0.86 mg/dL (ref 0.50–0.99)
Calcium: 10.4 mg/dL (ref 8.6–10.4)
Glucose, Bld: 89 mg/dL (ref 65–99)
Potassium: 4.3 mmol/L (ref 3.5–5.3)
SODIUM: 140 mmol/L (ref 135–146)
Total Bilirubin: 0.5 mg/dL (ref 0.2–1.2)
Total Protein: 7.4 g/dL (ref 6.1–8.1)

## 2016-05-07 LAB — HIV ANTIBODY (ROUTINE TESTING W REFLEX): HIV 1&2 Ab, 4th Generation: NONREACTIVE

## 2016-05-07 LAB — POC MICROSCOPIC URINALYSIS (UMFC): MUCUS RE: ABSENT

## 2016-05-07 LAB — HEPATITIS C ANTIBODY: HCV Ab: NEGATIVE

## 2016-05-07 MED ORDER — SIMVASTATIN 40 MG PO TABS: 40.0000 mg | ORAL_TABLET | Freq: Every day | ORAL | 1 refills | Status: DC

## 2016-05-07 MED ORDER — HYDROCHLOROTHIAZIDE 25 MG PO TABS: 25.0000 mg | ORAL_TABLET | Freq: Every day | ORAL | 1 refills | Status: DC

## 2016-05-07 NOTE — Patient Instructions (Addendum)
     IF you received an x-ray today, you will receive an invoice from [facility]. Please contact [facility] at [phone] with questions or concerns regarding your invoice.   IF you received labwork today, you will receive an invoice from [employer]. Please contact [facility] at [phone] with questions or concerns regarding your invoice.   Our billing staff will not be able to assist you with questions regarding bills from these companies.  You will be contacted with the lab results as soon as they are available. The fastest way to get your results is to activate your My Chart account. Instructions are located on the last page of this paperwork. If you have not heard from [place] regarding the results in 2 weeks, please contact this office.    Food Choices for Gastroesophageal Reflux Disease, Adult When you have gastroesophageal reflux disease (GERD), the foods you eat and your eating habits are very important. Choosing the right foods can help ease the discomfort of GERD. WHAT GENERAL GUIDELINES DO I NEED TO FOLLOW?  Choose fruits, vegetables, whole grains, low-fat dairy products, and low-fat meat, fish, and poultry.  Limit fats such as oils, salad dressings, butter, nuts, and avocado.  Keep a food diary to identify foods that cause symptoms.  Avoid foods that cause reflux. These may be different for different people.  Eat frequent small meals instead of three large meals each day.  Eat your meals slowly, in a relaxed setting.  Limit fried foods.  Cook foods using methods other than frying.  Avoid drinking alcohol.  Avoid drinking large amounts of liquids with your meals.  Avoid bending over or lying down until 2-3 hours after eating. WHAT FOODS ARE NOT RECOMMENDED? The following are some foods and drinks that may worsen your symptoms: Vegetables Tomatoes. Tomato juice. Tomato and spaghetti sauce. Chili peppers. Onion and garlic.  Horseradish. Fruits Oranges, grapefruit, and lemon (fruit and juice). Meats High-fat meats, fish, and poultry. This includes hot dogs, ribs, ham, sausage, salami, and bacon. Dairy Whole milk and chocolate milk. Sour cream. Cream. Butter. Ice cream. Cream cheese.  Beverages Coffee and tea, with or without caffeine. Carbonated beverages or energy drinks. Condiments Hot sauce. Barbecue sauce.  Sweets/Desserts Chocolate and cocoa. Donuts. Peppermint and spearmint. Fats and Oils High-fat foods, including [place] fries and potato chips. Other Vinegar. Strong spices, such as black pepper, white pepper, red pepper, cayenne, curry powder, cloves, ginger, and chili powder. The items listed above may not be a complete list of foods and beverages to avoid. Contact your dietitian for more information.   This information is not intended to replace advice given to you by your health care provider. Make sure you discuss any questions you have with your health care provider.   Document Released: 08/29/2005 Document Revised: 09/19/2014 Document Reviewed: 07/03/2013 Elsevier Interactive Patient Education [employer].

## 2016-05-07 NOTE — Progress Notes (Signed)
Subjective:  By signing my name below, I, Raven Small, attest that this documentation has been prepared under the direction and in the presence of Reginia Forts, MD.  Electronically Signed: Thea Alken, ED Scribe. 05/07/2016. 2:52 PM.   Patient ID: Lori Cook, female    DOB: 06-06-1951, 65 y.o.   MRN: [id number]  05/07/2016  Medication Refill (SIMVASTATIN,HYDRODIURIL) and Follow-up (kidney stone)   HPI Lori Cook is a 65 y.o. female who presents to the [facility] for a six month medication refill. I saw her 1 year ago but saw Dr. Carlota Raspberry in f/u 5 months ago.   Hyperlipidemia   Lab Results  Component Value Date   ALT 16 11/28/2015   AST 18 11/28/2015   ALKPHOS 73 11/28/2015   BILITOT 0.7 11/28/2015   Lab Results  Component Value Date   CHOL 168 11/28/2015   HDL 61 11/28/2015   LDLCALC 84 11/28/2015   TRIG 114 11/28/2015   CHOLHDL 2.8 11/28/2015   Cholesterol was under good control at last visit.  She is on simvastatin 40 mg. She is tolerating medication well.    Hypertension She is on HCTZ 25 mg Pt checks BP at home. She has not checked BP in week but it usually reads 128-140/60-80. She reports occasional palpitations or heart flutter when laying down at night.   Hematuria Pt was seen by [facility] for hematuria. She had 1 60mm kidney stone on left. Small pulmonary nodule. 2 5x5 mm nodule right lower lobe and in the left lower lobe 5 mm nodule. Pt quit smoking 5 years ago, she smoked a pack a day for 15-18 years.  She had a large gallbladder stone in the gallbladder; gallbladder was not inflamed.    Hx of retinal occlusion She has an appointment in 3 days with ophthalmologist. She has not been seen in 3 months. She does not take aspirin.     Hip pain  Pt has been seen for hip pain in the past. States pain has improved greatly but still occasionally wakes up in the morning with hip pain. She has been taking naproxen three days per week  on average.  GERD Her symptoms consist of belching and cough. Pt has not been taking prilosec daily; she is concerned about long term effects of this medication. She plans to start taking zantac. Prilosec does improve cough.  Sleep  Pt snores. She wakes up in the morning feeling refreshed. She wakes up once to urinate during the night.   Health maintenance Colonoscopy- [facility] in [place], 8 years ago. Repeat in 1-2 years.   Mammogram- she is UTD.  Pap smear- hx of hysterectomy.  No recent gynecology follow-up due to hysterectomy status.  Screening She agrees to having HIV and Hep C screening  Immunization  Immunization History  Administered Date(s) Administered  . Influenza Split 07/28/2015  . Influenza, Seasonal, Injecte, Preservative Fre 09/08/2012, 09/19/2013  . Influenza,inj,Quad PF,36+ Mos 05/07/2016  . Pneumococcal Polysaccharide-23 11/09/2013  . Tdap 11/09/2013  . Zoster 09/12/2013   Pt has had shingles vaccine  She agrees to flu shot.   Review of Systems  Constitutional: Negative for chills, diaphoresis, fatigue and fever.  HENT: Negative for ear pain, postnasal drip, rhinorrhea, sinus pressure, sore throat and trouble swallowing.   Eyes: Negative for visual disturbance.  Respiratory: Negative for cough and shortness of breath.   Cardiovascular: Positive for palpitations. Negative for chest pain and leg swelling.  Gastrointestinal: Negative for abdominal  pain, constipation, diarrhea, nausea and vomiting.  Endocrine: Negative for cold intolerance, heat intolerance, polydipsia, polyphagia and polyuria.  Genitourinary: Negative for hematuria.  Neurological: Negative for dizziness, tremors, seizures, syncope, facial asymmetry, speech difficulty, weakness, light-headedness, numbness and headaches.  Psychiatric/Behavioral: Negative for dysphoric mood.    Past Medical History:  Diagnosis Date  . Allergy   . Blood transfusion without reported diagnosis   .  Cholelithiasis    large stone detected by CT 01/2016  . Hyperlipidemia   . Hypertension   . Nephrolithiasis    19mm kidney stone L by CT 01/2016  . 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 Stroke [facility])    Past Surgical History:  Procedure Laterality Date  . ABDOMINAL HYSTERECTOMY    . APPENDECTOMY    . SPINE SURGERY     Allergies  Allergen Reactions  . Codeine Other (See Comments)    Headache   Current Outpatient Prescriptions  Medication Sig Dispense Refill  . acetaminophen (TYLENOL) 500 MG chewable tablet Chew 1,000 mg by mouth every 6 (six) hours as needed for pain. Reported on 11/28/2015    . Aflibercept (EYLEA IO) Inject into the eye.    . hydrochlorothiazide (HYDRODIURIL) 25 MG tablet Take 1 tablet (25 mg total) by mouth daily. 90 tablet 1  . naproxen (NAPROSYN) 500 MG tablet Take 500 mg by mouth 2 (two) times daily with a meal.    . omeprazole (PRILOSEC) 20 MG capsule Take 1 capsule (20 mg total) by mouth daily. 30 capsule 2  . simvastatin (ZOCOR) 40 MG tablet Take 1 tablet (40 mg total) by mouth at bedtime. 90 tablet 1   No current facility-administered medications for this visit.    Social History   Social History  . Marital status: Single    Spouse name: N/A  . Number of children: N/A  . Years of education: N/A   Occupational History  . Not on file.   Social History Main Topics  . Smoking status: Former Smoker    Quit date: 09/08/2010  . Smokeless tobacco: Never Used  . Alcohol use No  . Drug use: No  . Sexual activity: Not on file   Other Topics Concern  . Not on file   Social History Narrative   Marital status: single     Children: 2 daughters, 1 son passed away 39.38 years old.      Lives: with daughter      Employment:  Education administrator business full time      Tobacco: quit smoking      Alcohol:  None      Exercise: no formal exercise   Family History  Problem Relation Age of Onset  . Stroke Father   . Diabetes Father   . Heart disease Father 14     AMI  . Hypertension Sister   . Arthritis Brother   . Heart disease Paternal Uncle        Objective:    BP 122/78 (BP Location: Right Arm, Patient Position: Sitting, Cuff Size: Small)   Pulse 64   Temp 97.8 F (36.6 C) (Oral)   Resp 18   Ht 5\' 7"  (1.702 m)   Wt 157 lb (71.2 kg)   SpO2 99%   BMI 24.59 kg/m  Physical Exam  Constitutional: She is oriented to person, place, and time. She appears well-developed and well-nourished. No distress.  HENT:  Head: Normocephalic and atraumatic.  Right Ear: External ear normal.  Left Ear: External ear normal.  Nose:  Nose normal.  Mouth/Throat: Oropharynx is clear and moist.  Eyes: Conjunctivae and EOM are normal. Pupils are equal, round, and reactive to light.  Neck: Normal range of motion. Neck supple. Carotid bruit is not present. No thyromegaly present.  Cardiovascular: Normal rate, regular rhythm, normal heart sounds and intact distal pulses.  Exam reveals no gallop and no friction rub.   No murmur heard. Pulmonary/Chest: Effort normal and breath sounds normal. She has no wheezes. She has no rales.  Abdominal: Soft. Bowel sounds are normal. She exhibits no distension and no mass. There is no tenderness. There is no rebound and no guarding.  Lymphadenopathy:    She has no cervical adenopathy.  Neurological: She is alert and oriented to person, place, and time. No cranial nerve deficit.  Skin: Skin is warm and dry. No rash noted. She is not diaphoretic. No erythema. No pallor.  Psychiatric: She has a normal mood and affect. Her behavior is normal.   Results for orders placed or performed in visit on 05/07/16  POCT Microscopic Urinalysis ([facility])  Result Value Ref Range   WBC,UR,HPF,POC None None WBC/hpf   RBC,UR,HPF,POC Few (A) None RBC/hpf   Bacteria Few (A) None, Too numerous to count   Mucus Absent Absent   Epithelial Cells, UR Per Microscopy Few (A) None, Too numerous to count cells/hpf  POCT urinalysis dipstick  Result Value Ref  Range   Color, UA yellow yellow   Clarity, UA clear clear   Glucose, UA negative negative   Bilirubin, UA negative negative   Ketones, POC UA negative negative   Spec Grav, UA 1.010    Blood, UA trace-intact (A) negative   pH, UA 7.0    Protein Ur, POC negative negative   Urobilinogen, UA 0.2    Nitrite, UA Negative Negative   Leukocytes, UA Negative Negative       Assessment & Plan:   1. Essential hypertension, benign   2. Hyperlipidemia with target LDL less than 130   3. Retinal vein occlusion   4. Seasonal allergic rhinitis due to pollen   5. Screening for diabetes mellitus   6. Screening for HIV (human immunodeficiency virus)   7. Need for hepatitis C screening test   8. Hematuria, microscopic   9. Hyperlipidemia   10. Need for prophylactic vaccination and inoculation against influenza   11. Pulmonary nodules   12. Nephrolithiasis   13. Calculus of gallbladder without cholecystitis without obstruction    -BP controlled; obtain labs; refills provided; RTC six months. -cholesterol controlled; refill provided. -recommend increasing frequency of PPI use due to persistent GERD symptoms; agreeable to trial of Zantac as well. -will warrant repeat CT chest to follow-up on pulmonary nodules in May 2018. -s/p urology evaluation for hematuria microscopic; +CT detected nephrolithiasis; no further follow-up with urology unless hematuria worsens. -cholelithiasis asymptomatic at this time. -Recommend starting ASA 81mg  daily due to HTN, retinal vein occlusion hx; warrants aggressive control of lipids and blood pressure due to history of retinal vein occlusion. -obtain age appropriate screening labs. -s/p flu vaccine. Orders Placed This Encounter  Procedures  . Flu Vaccine QUAD 36+ mos IM  . CBC with Differential/Platelet  . Comprehensive metabolic panel    Order Specific Question:   Has the patient fasted?    Answer:   Yes  . Lipid panel    Order Specific Question:   Has the  patient fasted?    Answer:   Yes  . TSH  . Hemoglobin A1c  . HIV  antibody  . Hepatitis C antibody  . POCT Microscopic Urinalysis ([facility])  . POCT urinalysis dipstick    Meds ordered this encounter  Medications  . simvastatin (ZOCOR) 40 MG tablet    Sig: Take 1 tablet (40 mg total) by mouth at bedtime.    Dispense:  90 tablet    Refill:  1  . hydrochlorothiazide (HYDRODIURIL) 25 MG tablet    Sig: Take 1 tablet (25 mg total) by mouth daily.    Dispense:  90 tablet    Refill:  1  -start taking aspirin  Return in about 6 months (around 11/07/2016) for complete physical examiniation.   I personally performed the services described in this documentation, which was scribed in my presence. The recorded information has been reviewed and considered.  Charlei Ramsaran Elayne Guerin, M.D. [facility] [address] [place], [place]  [address] [phone] phone [phone] fax

## 2016-05-08 LAB — HEMOGLOBIN A1C
Hgb A1c MFr Bld: 5.3 % (ref <5.7)
MEAN PLASMA GLUCOSE: 105 mg/dL

## 2016-07-06 ENCOUNTER — Ambulatory Visit (INDEPENDENT_AMBULATORY_CARE_PROVIDER_SITE_OTHER): Payer: BLUE CROSS/BLUE SHIELD | Admitting: Family Medicine

## 2016-07-06 VITALS — BP 116/72 | HR 80 | Temp 98.2°F | Resp 17 | Ht 67.0 in | Wt 155.0 lb

## 2016-07-06 DIAGNOSIS — I10 Essential (primary) hypertension: Secondary | ICD-10-CM

## 2016-07-06 DIAGNOSIS — K8020 Calculus of gallbladder without cholecystitis without obstruction: Secondary | ICD-10-CM | POA: Diagnosis not present

## 2016-07-06 MED ORDER — HYDROCHLOROTHIAZIDE 25 MG PO TABS: 25.0000 mg | ORAL_TABLET | Freq: Every day | ORAL | 1 refills | Status: DC

## 2016-07-06 NOTE — Progress Notes (Signed)
Subjective:    Patient ID: Lori Cook, female    DOB: August 19, 1951, 65 y.o.   MRN: [id number]  07/06/2016  Medication Refill (Hydrochlorothiazide) and Cholelithiasis (Pt would like to discuss surgery )   HPI This 65 y.o. female presents for two month follow-up of cholelithiasis.  S/p CT scan showed kidney stone and cholelithiasis.  Thinks that kidney stone passed since last visit.  Has met deductible and has paid off.  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  Large gallstone.  S/p colonoscopy at [facility]; not sure of date; [place].    BP Readings from Last 3 Encounters:  07/06/16 116/72  05/07/16 122/78  11/28/15 130/72   Wt Readings from Last 3 Encounters:  07/06/16 155 lb (70.3 kg)  05/07/16 157 lb (71.2 kg)  11/28/15 156 lb 9.6 oz (71 kg)    Review of Systems  Constitutional: Negative for chills, diaphoresis, fatigue and fever.  Eyes: Negative for visual disturbance.  Respiratory: Negative for cough and shortness of breath.   Cardiovascular: Negative for chest pain, palpitations and leg swelling.  Gastrointestinal: Negative for abdominal pain, constipation, diarrhea, nausea and vomiting.  Endocrine: Negative for cold intolerance, heat intolerance, polydipsia, polyphagia and polyuria.  Neurological: Negative for dizziness, tremors, seizures, syncope, facial asymmetry, speech difficulty, weakness, light-headedness, numbness and headaches.    Past Medical History:  Diagnosis Date  . Allergy   . Blood transfusion without reported diagnosis   . Cholelithiasis    large stone detected by CT 01/2016  . Hyperlipidemia   . Hypertension   . Nephrolithiasis    35m kidney stone L by CT 01/2016  . 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 Stroke ([facility]    Past Surgical History:  Procedure Laterality Date  . ABDOMINAL HYSTERECTOMY    . APPENDECTOMY    . SPINE SURGERY     Allergies  Allergen Reactions  . Codeine Other (See Comments)    Headache   Current  Outpatient Prescriptions  Medication Sig Dispense Refill  . acetaminophen (TYLENOL) 500 MG chewable tablet Chew 1,000 mg by mouth every 6 (six) hours as needed for pain. Reported on 11/28/2015    . hydrochlorothiazide (HYDRODIURIL) 25 MG tablet Take 1 tablet (25 mg total) by mouth daily. 90 tablet 1  . naproxen (NAPROSYN) 500 MG tablet Take 500 mg by mouth 2 (two) times daily with a meal.    . ranitidine (ZANTAC) 75 MG tablet Take 75 mg by mouth 2 (two) times daily.    . simvastatin (ZOCOR) 40 MG tablet Take 1 tablet (40 mg total) by mouth at bedtime. 90 tablet 1   No current facility-administered medications for this visit.    Social History   Social History  . Marital status: Single    Spouse name: N/A  . Number of children: N/A  . Years of education: N/A   Occupational History  . Not on file.   Social History Main Topics  . Smoking status: Former Smoker    Quit date: 09/08/2010  . Smokeless tobacco: Never Used  . Alcohol use No  . Drug use: No  . Sexual activity: Not on file   Other Topics Concern  . Not on file   Social History Narrative   Marital status: single     Children: 2 daughters, 1 son passed away [id number] old.      Lives: with daughter      Employment:  CEducation administratorbusiness full time      Tobacco:  quit smoking      Alcohol:  None      Exercise: no formal exercise   Family History  Problem Relation Age of Onset  . Stroke Father   . Diabetes Father   . Heart disease Father 22    AMI  . Hypertension Sister   . Arthritis Brother   . Heart disease Paternal Uncle        Objective:    BP 116/72 (BP Location: Right Arm, Patient Position: Sitting, Cuff Size: Normal)   Pulse 80   Temp 98.2 F (36.8 C) (Oral)   Resp 17   Ht _0  (1.702 m)   Wt 155 lb (70.3 kg)   SpO2 97%   BMI 24.28 kg/m  Physical Exam  Constitutional: She is oriented to person, place, and time. She appears well-developed and well-nourished. No distress.  HENT:  Head: Normocephalic  and atraumatic.  Right Ear: External ear normal.  Left Ear: External ear normal.  Nose: Nose normal.  Mouth/Throat: Oropharynx is clear and moist.  Eyes: Conjunctivae and EOM are normal. Pupils are equal, round, and reactive to light.  Neck: Normal range of motion. Neck supple. Carotid bruit is not present. No thyromegaly present.  Cardiovascular: Normal rate, regular rhythm, normal heart sounds and intact distal pulses.  Exam reveals no gallop and no friction rub.   No murmur heard. Pulmonary/Chest: Effort normal and breath sounds normal. She has no wheezes. She has no rales.  Abdominal: Soft. Bowel sounds are normal. She exhibits no distension and no mass. There is no tenderness. There is no rebound and no guarding.  Lymphadenopathy:    She has no cervical adenopathy.  Neurological: She is alert and oriented to person, place, and time. No cranial nerve deficit.  Skin: Skin is warm and dry. No rash noted. She is not diaphoretic. No erythema. No pallor.  Psychiatric: She has a normal mood and affect. Her behavior is normal.   Results for orders placed or performed in visit on 05/07/16  CBC with Differential/Platelet  Result Value Ref Range   WBC 7.4 3.8 - 10.8 K/uL   RBC 4.65 3.80 - 5.10 MIL/uL   Hemoglobin 14.2 11.7 - 15.5 g/dL   HCT 41.2 35.0 - 45.0 %   MCV 88.6 80.0 - 100.0 fL   MCH 30.5 27.0 - 33.0 pg   MCHC 34.5 32.0 - 36.0 g/dL   RDW 13.3 11.0 - 15.0 %   Platelets 331 140 - 400 K/uL   MPV 12.8 (H) 7.5 - 12.5 fL   Neutro Abs 3,478 1,500 - 7,800 cells/uL   Lymphs Abs 2,886 850 - 3,900 cells/uL   Monocytes Absolute 888 200 - 950 cells/uL   Eosinophils Absolute 148 15 - 500 cells/uL   Basophils Absolute 0 0 - 200 cells/uL   Neutrophils Relative % 47 %   Lymphocytes Relative 39 %   Monocytes Relative 12 %   Eosinophils Relative 2 %   Basophils Relative 0 %   Smear Review Criteria for review not met   Comprehensive metabolic panel  Result Value Ref Range   Sodium 140 135 -  146 mmol/L   Potassium 4.3 3.5 - 5.3 mmol/L   Chloride 102 98 - 110 mmol/L   CO2 29 20 - 31 mmol/L   Glucose, Bld 89 65 - 99 mg/dL   BUN 14 7 - 25 mg/dL   Creat 0.86 0.50 - 0.99 mg/dL   Total Bilirubin 0.5 0.2 - 1.2 mg/dL   Alkaline  Phosphatase 78 33 - 130 U/L   AST 17 10 - 35 U/L   ALT 16 6 - 29 U/L   Total Protein 7.4 6.1 - 8.1 g/dL   Albumin 4.8 3.6 - 5.1 g/dL   Calcium 10.4 8.6 - 10.4 mg/dL  Lipid panel  Result Value Ref Range   Cholesterol 165 125 - 200 mg/dL   Triglycerides 125 <150 mg/dL   HDL 62 >=46 mg/dL   Total CHOL/HDL Ratio 2.7 <=5.0 Ratio   VLDL 25 <30 mg/dL   LDL Cholesterol 78 <130 mg/dL  TSH  Result Value Ref Range   TSH 2.48 mIU/L  Hemoglobin A1c  Result Value Ref Range   Hgb A1c MFr Bld 5.3 <5.7 %   Mean Plasma Glucose 105 mg/dL  HIV antibody  Result Value Ref Range   HIV 1&2 Ab, 4th Generation NONREACTIVE NONREACTIVE  Hepatitis C antibody  Result Value Ref Range   HCV Ab NEGATIVE NEGATIVE  POCT Microscopic Urinalysis ([facility])  Result Value Ref Range   WBC,UR,HPF,POC None None WBC/hpf   RBC,UR,HPF,POC Few (A) None RBC/hpf   Bacteria Few (A) None, Too numerous to count   Mucus Absent Absent   Epithelial Cells, UR Per Microscopy Few (A) None, Too numerous to count cells/hpf  POCT urinalysis dipstick  Result Value Ref Range   Color, UA yellow yellow   Clarity, UA clear clear   Glucose, UA negative negative   Bilirubin, UA negative negative   Ketones, POC UA negative negative   Spec Grav, UA 1.010    Blood, UA trace-intact (A) negative   pH, UA 7.0    Protein Ur, POC negative negative   Urobilinogen, UA 0.2    Nitrite, UA Negative Negative   Leukocytes, UA Negative Negative       Assessment & Plan:   1. Calculus of gallbladder without cholecystitis without obstruction   2. Essential hypertension, benign    -refer to general surgery for consultation regarding cholelithiasis. -refill of HCTZ provided.   Orders Placed This Encounter    Procedures  . Ambulatory referral to General Surgery    Referral Priority:   Routine    Referral Type:   Surgical    Referral Reason:   Specialty Services Required    Requested Specialty:   General Surgery    Number of Visits Requested:   1   Meds ordered this encounter  Medications  . ranitidine (ZANTAC) 75 MG tablet    Sig: Take 75 mg by mouth 2 (two) times daily.  . hydrochlorothiazide (HYDRODIURIL) 25 MG tablet    Sig: Take 1 tablet (25 mg total) by mouth daily.    Dispense:  90 tablet    Refill:  1    No Follow-up on file.   Ulanda Tackett Elayne Guerin, M.D. [facility] [address] [place], [place]  [address] [phone] phone [phone] fax

## 2016-07-06 NOTE — Patient Instructions (Addendum)
IF you received an x-ray today, you will receive an invoice from [facility]. Please contact [facility] at [phone] with questions or concerns regarding your invoice.   IF you received labwork today, you will receive an invoice from [employer]. Please contact [facility] at [phone] with questions or concerns regarding your invoice.   Our billing staff will not be able to assist you with questions regarding bills from these companies.  You will be contacted with the lab results as soon as they are available. The fastest way to get your results is to activate your My Chart account. Instructions are located on the last page of this paperwork. If you have not heard from [place] regarding the results in 2 weeks, please contact this office.      Cholelithiasis Cholelithiasis (also called gallstones) is a form of gallbladder disease in which gallstones form in your gallbladder. The gallbladder is an organ that stores bile made in the liver, which helps digest fats. Gallstones begin as small crystals and slowly grow into stones. Gallstone pain occurs when the gallbladder spasms and a gallstone is blocking the duct. Pain can also occur when a stone passes out of the duct.  RISK FACTORS  Being female.   Having multiple pregnancies. Health care providers sometimes advise removing diseased gallbladders before future pregnancies.   Being obese.  Eating a diet heavy in fried foods and fat.   Being older than 83 years and increasing age.   Prolonged use of medicines containing female hormones.   Having diabetes mellitus.   Rapidly losing weight.   Having a family history of gallstones (heredity).  SYMPTOMS  Nausea.   Vomiting.  Abdominal pain.   Yellowing of the skin (jaundice).   Sudden pain. It may persist from several minutes to several hours.  Fever.   Tenderness to the touch. In some cases, when gallstones do not  move into the bile duct, people have no pain or symptoms. These are called "silent" gallstones.  TREATMENT Silent gallstones do not need treatment. In severe cases, emergency surgery may be required. Options for treatment include:  Surgery to remove the gallbladder. This is the most common treatment.  Medicines. These do not always work and may take 6-12 months or more to work.  Shock wave treatment (extracorporeal biliary lithotripsy). In this treatment an ultrasound machine sends shock waves to the gallbladder to break gallstones into smaller pieces that can pass into the intestines or be dissolved by medicine. HOME CARE INSTRUCTIONS   Only take over-the-counter or prescription medicines for pain, discomfort, or fever as directed by your health care provider.   Follow a low-fat diet until seen again by your health care provider. Fat causes the gallbladder to contract, which can result in pain.   Follow up with your health care provider as directed. Attacks are almost always recurrent and surgery is usually required for permanent treatment.  SEEK IMMEDIATE MEDICAL CARE IF:   Your pain increases and is not controlled by medicines.   You have a fever or persistent symptoms for more than 2-3 days.   You have a fever and your symptoms suddenly get worse.   You have persistent nausea and vomiting.  MAKE SURE YOU:   Understand these instructions.  Will watch your condition.  Will get help right away if you are not doing well or get worse.   This information is not intended to replace advice given to you by your health care provider. Make  sure you discuss any questions you have with your health care provider.   Document Released: 08/25/2005 Document Revised: 05/01/2013 Document Reviewed: 02/20/2013 Elsevier Interactive Patient Education [employer].

## 2016-07-13 ENCOUNTER — Encounter: Payer: Self-pay | Admitting: Family Medicine

## 2016-07-29 ENCOUNTER — Other Ambulatory Visit: Payer: Self-pay | Admitting: Surgery

## 2016-11-18 ENCOUNTER — Other Ambulatory Visit: Payer: Self-pay | Admitting: Family Medicine

## 2016-11-18 DIAGNOSIS — E785 Hyperlipidemia, unspecified: Secondary | ICD-10-CM

## 2016-12-24 ENCOUNTER — Ambulatory Visit (INDEPENDENT_AMBULATORY_CARE_PROVIDER_SITE_OTHER): Payer: BLUE CROSS/BLUE SHIELD | Admitting: Family Medicine

## 2016-12-24 ENCOUNTER — Encounter: Payer: Self-pay | Admitting: Family Medicine

## 2016-12-24 VITALS — BP 126/76 | HR 73 | Temp 98.7°F | Resp 16 | Ht 66.0 in | Wt 151.4 lb

## 2016-12-24 DIAGNOSIS — R300 Dysuria: Secondary | ICD-10-CM

## 2016-12-24 DIAGNOSIS — Z5181 Encounter for therapeutic drug level monitoring: Secondary | ICD-10-CM | POA: Diagnosis not present

## 2016-12-24 DIAGNOSIS — N3001 Acute cystitis with hematuria: Secondary | ICD-10-CM

## 2016-12-24 DIAGNOSIS — E785 Hyperlipidemia, unspecified: Secondary | ICD-10-CM | POA: Diagnosis not present

## 2016-12-24 DIAGNOSIS — I10 Essential (primary) hypertension: Secondary | ICD-10-CM | POA: Diagnosis not present

## 2016-12-24 DIAGNOSIS — M25541 Pain in joints of right hand: Secondary | ICD-10-CM

## 2016-12-24 DIAGNOSIS — Z1212 Encounter for screening for malignant neoplasm of rectum: Secondary | ICD-10-CM

## 2016-12-24 DIAGNOSIS — Z1211 Encounter for screening for malignant neoplasm of colon: Secondary | ICD-10-CM

## 2016-12-24 LAB — POC MICROSCOPIC URINALYSIS (UMFC): MUCUS RE: ABSENT

## 2016-12-24 LAB — POCT URINALYSIS DIP (MANUAL ENTRY)
BILIRUBIN UA: NEGATIVE
BILIRUBIN UA: NEGATIVE mg/dL
GLUCOSE UA: NEGATIVE mg/dL
Nitrite, UA: NEGATIVE
PH UA: 5 (ref 5.0–8.0)
Protein Ur, POC: NEGATIVE mg/dL
Spec Grav, UA: 1.025 (ref 1.010–1.025)
Urobilinogen, UA: 0.2 E.U./dL

## 2016-12-24 MED ORDER — CEPHALEXIN 500 MG PO CAPS: 500.0000 mg | ORAL_CAPSULE | Freq: Two times a day (BID) | ORAL | 0 refills | Status: DC

## 2016-12-24 MED ORDER — SIMVASTATIN 40 MG PO TABS: 40.0000 mg | ORAL_TABLET | Freq: Every day | ORAL | 3 refills | Status: DC

## 2016-12-24 MED ORDER — HYDROCHLOROTHIAZIDE 25 MG PO TABS: 25.0000 mg | ORAL_TABLET | Freq: Every day | ORAL | 3 refills | Status: DC

## 2016-12-24 NOTE — Progress Notes (Signed)
By signing my name below, I, Mesha Guinyard, attest that this documentation has been prepared under the direction and in the presence of Delman Cheadle, MD.  Electronically Signed: Verlee Monte, Medical Scribe. 12/24/16. 2:23 PM.  Subjective:    Patient ID: Lori Cook, female    DOB: November 06, 1950, 66 y.o.   MRN: [id number]  HPI Chief Complaint  Patient presents with  . Medication Refill    Simvastatin & HCTZ  . Dysuria    HPI Comments: Lori Cook is a 66 y.o. female who presents to the Primary Care at [place] complaining of dysuria and medication refill. She is a pt of Dr. Darliss Ridgel. Last seen 6 months prior. She takes HCTZ for her bp and simvastatin for cholesterol. Pt has only had coffee at 7:30am today.  Dysuria: Notes being dx with a kidney stone a few weeks ago. Reports associated sxs of "small" HA this morning and her baseline of nocturia 2x. Denies urinary frequency, light-headedness, dizziness, vision changes, mylagias, pelvic pain, and abdominal pain.  Medication Refill: HLD: Takes simvastatin. Pt is compliant with simvastatin. She has been taking this for 1 - 1.5 years. Denies experiencing myalgias, or other acute side effects from her medication. Lab Results  Component Value Date   CHOL 165 05/07/2016   HDL 62 05/07/2016   LDLCALC 78 05/07/2016   TRIG 125 05/07/2016   CHOLHDL 2.7 05/07/2016   Lab Results  Component Value Date   ALT 16 05/07/2016   AST 17 05/07/2016   ALKPHOS 78 05/07/2016   BILITOT 0.5 05/07/2016   HTN: Takes HCTZ 25 mg. She checks her bp but doesn't remember what her numbers are. Compliant with HCTZ and denies experiencing any negative side effects from her medication. Lab Results  Component Value Date   CREATININE 0.86 05/07/2016   Immobile right pinky finger: Reports immobile right pinky finger but her other fingers are reportedly flexable. She also notes right hip pain. She takes naproxen for relief of her hip  pain. Denies arthralgias in her finger. Denies FHx of severe arthralgias.  Pt cleans for a living.  Cancer Screening: Colon: Her colonoscopy 12 -15 years ago was done by [place] at [facility]. Repeat in 10 years. She would like to get a referral and schedule one soon. Cervical: Pt had a hysterectomy due to fibroid tumors.  Patient Active Problem List   Diagnosis Date Noted  . Calculus of gallbladder without cholecystitis without obstruction 05/07/2016  . Nephrolithiasis 05/07/2016  . Pulmonary nodules 05/07/2016  . Hematuria, microscopic 05/07/2016  . Essential hypertension, benign 04/24/2015  . Allergic rhinitis due to pollen 04/24/2015  . Retinal vein occlusion, branch 04/24/2015  . Retinal vein occlusion 03/08/2015  . Essential hypertension-new onset 03/08/2015  . Hyperlipidemia with target LDL less than 130 09/09/2012   Past Medical History:  Diagnosis Date  . Allergy   . Blood transfusion without reported diagnosis   . Cholelithiasis    large stone detected by CT 01/2016  . Hyperlipidemia   . Hypertension   . Nephrolithiasis    41mm kidney stone L by CT 01/2016  . 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 Stroke [facility])    Past Surgical History:  Procedure Laterality Date  . ABDOMINAL HYSTERECTOMY    . APPENDECTOMY    . CHOLECYSTECTOMY    . SPINE SURGERY     Allergies  Allergen Reactions  . Codeine Other (See Comments)    Headache   Prior to Admission medications  Medication Sig Start Date End Date Taking? Authorizing Provider  acetaminophen (TYLENOL) 500 MG chewable tablet Chew 1,000 mg by mouth every 6 (six) hours as needed for pain. Reported on 11/28/2015   Yes Historical Provider, MD  hydrochlorothiazide (HYDRODIURIL) 25 MG tablet Take 1 tablet (25 mg total) by mouth daily. 07/06/16  Yes Wardell Honour, MD  naproxen (NAPROSYN) 500 MG tablet Take 500 mg by mouth 2 (two) times daily with a meal.   Yes Historical Provider, MD  ranitidine (ZANTAC) 75 MG tablet Take  75 mg by mouth 2 (two) times daily.   Yes Historical Provider, MD  simvastatin (ZOCOR) 40 MG tablet TAKE ONE TABLET BY MOUTH ONCE DAILY AT BEDTIME 11/20/16  Yes Wardell Honour, MD   Social History   Social History  . Marital status: Single    Spouse name: N/A  . Number of children: N/A  . Years of education: N/A   Occupational History  . Not on file.   Social History Main Topics  . Smoking status: Former Smoker    Quit date: 09/08/2010  . Smokeless tobacco: Never Used  . Alcohol use No  . Drug use: No  . Sexual activity: Not on file   Other Topics Concern  . Not on file   Social History Narrative   Marital status: single     Children: 2 daughters, 1 son passed away 38.65 years old.      Lives: with daughter      Employment:  Education administrator business full time      Tobacco: quit smoking      Alcohol:  None      Exercise: no formal exercise   Review of Systems  Eyes: Negative for visual disturbance.  Gastrointestinal: Negative for abdominal pain.  Genitourinary: Positive for dysuria. Negative for frequency and pelvic pain.  Musculoskeletal: Positive for arthralgias (hip). Negative for myalgias.  Neurological: Positive for headaches. Negative for dizziness and light-headedness.   Objective:  Physical Exam  Constitutional: She appears well-developed and well-nourished. No distress.  HENT:  Head: Normocephalic and atraumatic.  Eyes: Conjunctivae are normal.  Neck: Neck supple.  Cardiovascular: Normal rate, regular rhythm, S1 normal, S2 normal and normal heart sounds.  Exam reveals no gallop and no friction rub.   No murmur heard. Pulmonary/Chest: Effort normal and breath sounds normal. No respiratory distress. She has no wheezes. She has no rales.  Abdominal: Bowel sounds are normal. There is no tenderness.  Neurological: She is alert.  Skin: Skin is warm and dry.  Psychiatric: She has a normal mood and affect. Her behavior is normal.  Nursing note and vitals reviewed.     Vitals:   12/24/16 1416  BP: 126/76  Pulse: 73  Resp: 16  Temp: 98.7 F (37.1 C)  TempSrc: Oral  SpO2: 98%  Weight: 151 lb 6 oz (68.7 kg)  Height: 5\' 6"  (1.676 m)  Body mass index is 24.43 kg/m. Assessment & Plan:   1. Essential hypertension, benign - cont hctz  2. Dysuria - UA sig with a UTI - treat with Kelfex 500 bid x 7d  3. Medication monitoring encounter   4. Hyperlipidemia, unspecified hyperlipidemia type - has been well controlled prior, pt fasting today so recheck. Cont simvastatin  5. Screening for colorectal cancer - due for repeat colonoscopy so referral placed, initially done at [facility] > 10 yrs prior  6. Arthralgia of right hand - in DIP joints, severe for age, no FHx. Rec trial of glucosamine-chondoiton or circumin.  Check inflammatory markers to r/o aggressive cause   Reminded pt to make an appt for a CPE in 6 mos.  Reminded pt to take advantage of the time limited "welcome to medicare" visit she will be eligible for after her birthday. Orders Placed This Encounter  Procedures  . Comprehensive metabolic panel    Order Specific Question:   Has the patient fasted?    Answer:   Yes  . Lipid panel    Order Specific Question:   Has the patient fasted?    Answer:   Yes  . CBC with Differential/Platelet  . Sedimentation Rate  . C-reactive protein  . ANA  . Ambulatory referral to Gastroenterology    Referral Priority:   Routine    Referral Type:   Consultation    Referral Reason:   Specialty Services Required    Number of Visits Requested:   1  . POCT urinalysis dipstick  . POCT Microscopic Urinalysis ([facility])    Meds ordered this encounter  Medications  . hydrochlorothiazide (HYDRODIURIL) 25 MG tablet    Sig: Take 1 tablet (25 mg total) by mouth daily.    Dispense:  90 tablet    Refill:  3  . simvastatin (ZOCOR) 40 MG tablet    Sig: Take 1 tablet (40 mg total) by mouth daily with breakfast.    Dispense:  90 tablet    Refill:  3    Due for cholesterol  check  . cephALEXin (KEFLEX) 500 MG capsule    Sig: Take 1 capsule (500 mg total) by mouth 2 (two) times daily.    Dispense:  14 capsule    Refill:  0    I personally performed the services described in this documentation, which was scribed in my presence. The recorded information has been reviewed and considered, and addended by me as needed.   Delman Cheadle, M.D.  Primary Care at [facility] [address] [place], [place] [address] [phone] phone [phone] fax  12/24/16 10:34 PM

## 2016-12-24 NOTE — Patient Instructions (Addendum)
IF you received an x-ray today, you will receive an invoice from [facility]. Please contact [facility] at [phone] with questions or concerns regarding your invoice.   IF you received labwork today, you will receive an invoice from [facility]. Please contact [facility] at [phone] with questions or concerns regarding your invoice.   Our billing staff will not be able to assist you with questions regarding bills from these companies.  You will be contacted with the lab results as soon as they are available. The fastest way to get your results is to activate your My Chart account. Instructions are located on the last page of this paperwork. If you have not heard from [place] regarding the results in 2 weeks, please contact this office.     Arthritis Arthritis is a term that is commonly used to refer to joint pain or joint disease. There are more than 100 types of arthritis. What are the causes? The most common cause of this condition is wear and tear of a joint. Other causes include:  Gout.  Inflammation of a joint.  An infection of a joint.  Sprains and other injuries near the joint.  A drug reaction or allergic reaction. In some cases, the cause may not be known. What are the signs or symptoms? The main symptom of this condition is pain in the joint with movement. Other symptoms include:  Redness, swelling, or stiffness at a joint.  Warmth coming from the joint.  Fever.  Overall feeling of illness. How is this diagnosed? This condition may be diagnosed with a physical exam and tests, including:  Blood tests.  Urine tests.  Imaging tests, such as MRI, X-rays, or a CT scan. Sometimes, fluid is removed from a joint for testing. How is this treated? Treatment for this condition may involve:  Treatment of the cause, if it is known.  Rest.  Raising (elevating) the joint.  Applying cold or hot packs to the joint.  Medicines to improve  symptoms and reduce inflammation.  Injections of a steroid such as cortisone into the joint to help reduce pain and inflammation. Depending on the cause of your arthritis, you may need to make lifestyle changes to reduce stress on your joint. These changes may include exercising more and losing weight. Follow these instructions at home: Medicines   Take over-the-counter and prescription medicines only as told by your health care provider.  Do not take aspirin to relieve pain if gout is suspected. Activity   Rest your joint if told by your health care provider. Rest is important when your disease is active and your joint feels painful, swollen, or stiff.  Avoid activities that make the pain worse. It is important to balance activity with rest.  Exercise your joint regularly with range-of-motion exercises as told by your health care provider. Try doing low-impact exercise, such as:  Swimming.  Water aerobics.  Biking.  Walking. Joint Care    If your joint is swollen, keep it elevated if told by your health care provider.  If your joint feels stiff in the morning, try taking a warm shower.  If directed, apply heat to the joint. If you have diabetes, do not apply heat without permission from your health care provider.  Put a towel between the joint and the hot pack or heating pad.  Leave the heat on the area for 20-30 minutes.  If directed, apply ice to the joint:  Put ice in a plastic bag.  Place a  towel between your skin and the bag.  Leave the ice on for 20 minutes, 2-3 times per day.  Keep all follow-up visits as told by your health care provider. This is important. Contact a health care provider if:  The pain gets worse.  You have a fever. Get help right away if:  You develop severe joint pain, swelling, or redness.  Many joints become painful and swollen.  You develop severe back pain.  You develop severe weakness in your leg.  You cannot control your  bladder or bowels. This information is not intended to replace advice given to you by your health care provider. Make sure you discuss any questions you have with your health care provider. Document Released: 10/06/2004 Document Revised: 02/04/2016 Document Reviewed: 11/24/2014 Elsevier Interactive Patient Education  2017 [employer].

## 2016-12-25 LAB — CBC WITH DIFFERENTIAL/PLATELET
Basophils Absolute: 0 10*3/uL (ref 0.0–0.2)
Basos: 0 %
EOS (ABSOLUTE): 0.1 10*3/uL (ref 0.0–0.4)
Eos: 1 %
HEMOGLOBIN: 14.1 g/dL (ref 11.1–15.9)
Hematocrit: 40.4 % (ref 34.0–46.6)
IMMATURE GRANS (ABS): 0 10*3/uL (ref 0.0–0.1)
Immature Granulocytes: 0 %
LYMPHS: 34 %
Lymphocytes Absolute: 3.2 10*3/uL — ABNORMAL HIGH (ref 0.7–3.1)
MCH: 30.3 pg (ref 26.6–33.0)
MCHC: 34.9 g/dL (ref 31.5–35.7)
MCV: 87 fL (ref 79–97)
MONOCYTES: 10 %
Monocytes Absolute: 0.9 10*3/uL (ref 0.1–0.9)
NEUTROS ABS: 5.1 10*3/uL (ref 1.4–7.0)
Neutrophils: 55 %
Platelets: 341 10*3/uL (ref 150–379)
RBC: 4.66 x10E6/uL (ref 3.77–5.28)
RDW: 13.4 % (ref 12.3–15.4)
WBC: 9.3 10*3/uL (ref 3.4–10.8)

## 2016-12-25 LAB — COMPREHENSIVE METABOLIC PANEL
ALBUMIN: 4.7 g/dL (ref 3.6–4.8)
ALT: 18 IU/L (ref 0–32)
AST: 24 IU/L (ref 0–40)
Albumin/Globulin Ratio: 1.9 (ref 1.2–2.2)
Alkaline Phosphatase: 84 IU/L (ref 39–117)
BUN/Creatinine Ratio: 23 (ref 12–28)
BUN: 23 mg/dL (ref 8–27)
Bilirubin Total: 0.5 mg/dL (ref 0.0–1.2)
CALCIUM: 10.2 mg/dL (ref 8.7–10.3)
CO2: 24 mmol/L (ref 18–29)
Chloride: 96 mmol/L (ref 96–106)
Creatinine, Ser: 1 mg/dL (ref 0.57–1.00)
GFR, EST AFRICAN AMERICAN: 69 mL/min/{1.73_m2} (ref >59)
GFR, EST NON AFRICAN AMERICAN: 60 mL/min/{1.73_m2} (ref >59)
GLUCOSE: 91 mg/dL (ref 65–99)
Globulin, Total: 2.5 g/dL (ref 1.5–4.5)
Potassium: 3.5 mmol/L (ref 3.5–5.2)
Sodium: 137 mmol/L (ref 134–144)
TOTAL PROTEIN: 7.2 g/dL (ref 6.0–8.5)

## 2016-12-25 LAB — LIPID PANEL
CHOL/HDL RATIO: 3.1 ratio (ref 0.0–4.4)
Cholesterol, Total: 183 mg/dL (ref 100–199)
HDL: 60 mg/dL (ref >39)
LDL CALC: 96 mg/dL (ref 0–99)
Triglycerides: 135 mg/dL (ref 0–149)
VLDL CHOLESTEROL CAL: 27 mg/dL (ref 5–40)

## 2016-12-26 LAB — C-REACTIVE PROTEIN

## 2016-12-26 LAB — SEDIMENTATION RATE

## 2016-12-26 LAB — ANA

## 2016-12-27 LAB — ANA: ANA: NEGATIVE

## 2016-12-27 LAB — SPECIMEN STATUS REPORT

## 2016-12-27 LAB — SEDIMENTATION RATE

## 2016-12-27 LAB — C-REACTIVE PROTEIN: CRP: 3 mg/L (ref 0.0–4.9)

## 2017-07-21 ENCOUNTER — Telehealth: Payer: Self-pay | Admitting: Gastroenterology

## 2017-07-21 NOTE — Telephone Encounter (Signed)
You can schedule a direct colonoscopy. Thanks

## 2017-07-21 NOTE — Telephone Encounter (Signed)
Dr. Havery Moros is Doc of the Day for 07/21/17 PM.  Former Dr. Deatra Ina patient. Patient is wanting to transfer back to our office because [facility] does not accept her insurance. Patient states that she is due for another colonoscopy. Per [facility] "patient was last seen in 2008. Records have been purged/destroyed".  There are no records to be reviewed. Does patient need an OV first or can I schedule a Direct Colon?

## 2017-07-22 ENCOUNTER — Encounter: Payer: Self-pay | Admitting: Emergency Medicine

## 2017-07-22 ENCOUNTER — Ambulatory Visit (INDEPENDENT_AMBULATORY_CARE_PROVIDER_SITE_OTHER): Payer: PPO | Admitting: Emergency Medicine

## 2017-07-22 ENCOUNTER — Other Ambulatory Visit: Payer: Self-pay

## 2017-07-22 VITALS — BP 132/88 | HR 81 | Temp 98.1°F | Resp 16 | Ht 66.0 in | Wt 152.2 lb

## 2017-07-22 DIAGNOSIS — H60501 Unspecified acute noninfective otitis externa, right ear: Secondary | ICD-10-CM

## 2017-07-22 DIAGNOSIS — H9201 Otalgia, right ear: Secondary | ICD-10-CM | POA: Diagnosis not present

## 2017-07-22 DIAGNOSIS — Z23 Encounter for immunization: Secondary | ICD-10-CM | POA: Diagnosis not present

## 2017-07-22 MED ORDER — HYDROCORTISONE-ACETIC ACID 1-2 % OT SOLN: 3.0000 [drp] | Freq: Three times a day (TID) | OTIC | 1 refills | Status: AC

## 2017-07-22 NOTE — Progress Notes (Signed)
Lori Cook 66 y.o.   Chief Complaint  Patient presents with  . Ear Pain    RIGHT x 1 day - started last night    HISTORY OF PRESENT ILLNESS: This is a 66 y.o. female complaining of right ear pain since last night. Denies trauma.  HPI   Prior to Admission medications   Medication Sig Start Date End Date Taking? Authorizing Provider  hydrochlorothiazide (HYDRODIURIL) 25 MG tablet Take 1 tablet (25 mg total) by mouth daily. 12/24/16  Yes Shawnee Knapp, MD  naproxen (NAPROSYN) 500 MG tablet Take 500 mg by mouth 2 (two) times daily with a meal.   Yes [provider]  ranitidine (ZANTAC) 75 MG tablet Take 75 mg by mouth 2 (two) times daily.   Yes [provider]  simvastatin (ZOCOR) 40 MG tablet Take 1 tablet (40 mg total) by mouth daily with breakfast. 12/24/16  Yes Shawnee Knapp, MD  acetaminophen (TYLENOL) 500 MG chewable tablet Chew 1,000 mg by mouth every 6 (six) hours as needed for pain. Reported on 11/28/2015    [provider]    Allergies  Allergen Reactions  . Codeine Other (See Comments)    Headache    Patient Active Problem List   Diagnosis Date Noted  . Calculus of gallbladder without cholecystitis without obstruction 05/07/2016  . Nephrolithiasis 05/07/2016  . Pulmonary nodules 05/07/2016  . Hematuria, microscopic 05/07/2016  . Essential hypertension, benign 04/24/2015  . Allergic rhinitis due to pollen 04/24/2015  . Retinal vein occlusion, branch 04/24/2015  . Retinal vein occlusion 03/08/2015  . Essential hypertension-new onset 03/08/2015  . Hyperlipidemia with target LDL less than 130 09/09/2012    Past Medical History:  Diagnosis Date  . Allergy   . Blood transfusion without reported diagnosis   . Cholelithiasis    large stone detected by CT 01/2016  . Hyperlipidemia   . Hypertension   . Nephrolithiasis    88mm kidney stone L by CT 01/2016  . 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 Stroke [facility])     Past Surgical  History:  Procedure Laterality Date  . ABDOMINAL HYSTERECTOMY    . APPENDECTOMY    . CHOLECYSTECTOMY    . SPINE SURGERY      Social History   Socioeconomic History  . Marital status: Single    Spouse name: Not on file  . Number of children: Not on file  . Years of education: Not on file  . Highest education level: Not on file  Social Needs  . Financial resource strain: Not on file  . Food insecurity - worry: Not on file  . Food insecurity - inability: Not on file  . Transportation needs - medical: Not on file  . Transportation needs - non-medical: Not on file  Occupational History  . Not on file  Tobacco Use  . Smoking status: Former Smoker    Last attempt to quit: 09/08/2010    Years since quitting: 6.8  . Smokeless tobacco: Never Used  Substance and Sexual Activity  . Alcohol use: No  . Drug use: No  . Sexual activity: Not on file  Other Topics Concern  . Not on file  Social History Narrative   Marital status: single     Children: 2 daughters, 1 son passed away 66.64 years old.      Lives: with daughter      Employment:  Education administrator business full time      Tobacco: quit smoking  Alcohol:  None      Exercise: no formal exercise    Family History  Problem Relation Age of Onset  . Stroke Father   . Diabetes Father   . Heart disease Father 32       AMI  . Hypertension Sister   . Arthritis Brother   . Heart disease Paternal Uncle      Review of Systems  Constitutional: Negative.  Negative for chills and fever.  HENT: Positive for ear pain. Negative for sore throat.   Eyes: Negative for discharge and redness.  Respiratory: Negative for cough and shortness of breath.   Cardiovascular: Negative for chest pain and palpitations.  Gastrointestinal: Negative for abdominal pain, diarrhea, nausea and vomiting.  Genitourinary: Negative for dysuria and hematuria.  Musculoskeletal: Negative for back pain, myalgias and neck pain.  Skin: Negative for rash.    Neurological: Negative for dizziness and headaches.  Endo/Heme/Allergies: Negative.   All other systems reviewed and are negative.   Vitals:   07/22/17 0953  BP: 132/88  Pulse: 81  Resp: 16  Temp: 98.1 F (36.7 C)  SpO2: 98%    Physical Exam  Constitutional: She is oriented to person, place, and time. She appears well-developed and well-nourished.  HENT:  Head: Normocephalic and atraumatic.  Right Ear: Tympanic membrane and external ear normal.  Left Ear: Tympanic membrane, external ear and ear canal normal.  Hyperemic and tender right external canal  Eyes: Conjunctivae and EOM are normal. Pupils are equal, round, and reactive to light.  Neck: Normal range of motion. Neck supple.  Cardiovascular: Normal rate and regular rhythm.  Pulmonary/Chest: Effort normal.  Neurological: She is alert and oriented to person, place, and time. No sensory deficit. She exhibits normal muscle tone.  Skin: Skin is warm and dry. Capillary refill takes less than 2 seconds.  Psychiatric: She has a normal mood and affect. Her behavior is normal.  Vitals reviewed.    ASSESSMENT & PLAN: Adiana was seen today for ear pain.  Diagnoses and all orders for this visit:  Acute otalgia, right  Acute otitis externa of right ear, unspecified type  Other orders -     acetic acid-hydrocortisone (VOSOL-HC) OTIC solution; Place 3 drops 3 (three) times daily for 3 days into the right ear.    Patient Instructions       IF you received an x-ray today, you will receive an invoice from [facility]. Please contact [facility] at [phone] with questions or concerns regarding your invoice.   IF you received labwork today, you will receive an invoice from [facility]. Please contact [facility] at [phone] with questions or concerns regarding your invoice.   Our billing staff will not be able to assist you with questions regarding bills from these companies.  You will be contacted with  the lab results as soon as they are available. The fastest way to get your results is to activate your My Chart account. Instructions are located on the last page of this paperwork. If you have not heard from [place] regarding the results in 2 weeks, please contact this office.     Earache, Adult An earache, or ear pain, can be caused by many things, including:  An infection.  Ear wax buildup.  Ear pressure.  Something in the ear that should not be there (foreign body).  A sore throat.  Tooth problems.  Jaw problems.  Treatment of the earache will depend on the cause. If the cause is not clear or cannot  be determined, you may need to watch your symptoms until your earache goes away or until a cause is found. Follow these instructions at home: Pay attention to any changes in your symptoms. Take these actions to help with your pain:  Take or apply over-the-counter and prescription medicines only as told by your health care provider.  If you were prescribed an antibiotic medicine, use it as told by your health care provider. Do not stop using the antibiotic even if you start to feel better.  Do not put anything in your ear other than medicine that is prescribed by your health care provider.  If directed, apply heat to the affected area as often as told by your health care provider. Use the heat source that your health care provider recommends, such as a moist heat pack or a heating pad. ? Place a towel between your skin and the heat source. ? Leave the heat on for 20-30 minutes. ? Remove the heat if your skin turns bright red. This is especially important if you are unable to feel pain, heat, or cold. You may have a greater risk of getting burned.  If directed, put ice on the ear: ? Put ice in a plastic bag. ? Place a towel between your skin and the bag. ? Leave the ice on for 20 minutes, 2-3 times a day.  Try resting in an upright position instead of lying down. This may help to  reduce pressure in your ear and relieve pain.  Chew gum if it helps to relieve your ear pain.  Treat any allergies as told by your health care provider.  Keep all follow-up visits as told by your health care provider. This is important.  Contact a health care provider if:  Your pain does not improve within 2 days.  Your earache gets worse.  You have new symptoms.  You have a fever. Get help right away if:  You have a severe headache.  You have a stiff neck.  You have trouble swallowing.  You have redness or swelling behind your ear.  You have fluid or blood coming from your ear.  You have hearing loss.  You feel dizzy. This information is not intended to replace advice given to you by your health care provider. Make sure you discuss any questions you have with your health care provider. Document Released: 04/15/2004 Document Revised: 04/26/2016 Document Reviewed: 02/22/2016 Elsevier Interactive Patient Education  2018 [employer].      Agustina Caroli, MD [facility]

## 2017-07-22 NOTE — Addendum Note (Signed)
Addended by: Alfredia Ferguson A on: 07/22/2017 10:25 AM   Modules accepted: Orders

## 2017-07-22 NOTE — Patient Instructions (Addendum)
   IF you received an x-ray today, you will receive an invoice from [facility]. Please contact [facility] at [phone] with questions or concerns regarding your invoice.   IF you received labwork today, you will receive an invoice from [facility]. Please contact [facility] at [phone] with questions or concerns regarding your invoice.   Our billing staff will not be able to assist you with questions regarding bills from these companies.  You will be contacted with the lab results as soon as they are available. The fastest way to get your results is to activate your My Chart account. Instructions are located on the last page of this paperwork. If you have not heard from us regarding the results in 2 weeks, please contact this office.      Earache, Adult An earache, or ear pain, can be caused by many things, including:  An infection.  Ear wax buildup.  Ear pressure.  Something in the ear that should not be there (foreign body).  A sore throat.  Tooth problems.  Jaw problems.  Treatment of the earache will depend on the cause. If the cause is not clear or cannot be determined, you may need to watch your symptoms until your earache goes away or until a cause is found. Follow these instructions at home: Pay attention to any changes in your symptoms. Take these actions to help with your pain:  Take or apply over-the-counter and prescription medicines only as told by your health care provider.  If you were prescribed an antibiotic medicine, use it as told by your health care provider. Do not stop using the antibiotic even if you start to feel better.  Do not put anything in your ear other than medicine that is prescribed by your health care provider.  If directed, apply heat to the affected area as often as told by your health care provider. Use the heat source that your health care provider recommends, such as a moist heat pack or a heating pad. ? Place a  towel between your skin and the heat source. ? Leave the heat on for 20-30 minutes. ? Remove the heat if your skin turns bright red. This is especially important if you are unable to feel pain, heat, or cold. You may have a greater risk of getting burned.  If directed, put ice on the ear: ? Put ice in a plastic bag. ? Place a towel between your skin and the bag. ? Leave the ice on for 20 minutes, 2-3 times a day.  Try resting in an upright position instead of lying down. This may help to reduce pressure in your ear and relieve pain.  Chew gum if it helps to relieve your ear pain.  Treat any allergies as told by your health care provider.  Keep all follow-up visits as told by your health care provider. This is important.  Contact a health care provider if:  Your pain does not improve within 2 days.  Your earache gets worse.  You have new symptoms.  You have a fever. Get help right away if:  You have a severe headache.  You have a stiff neck.  You have trouble swallowing.  You have redness or swelling behind your ear.  You have fluid or blood coming from your ear.  You have hearing loss.  You feel dizzy. This information is not intended to replace advice given to you by your health care provider. Make sure you discuss any questions you have with   your health care provider. Document Released: 04/15/2004 Document Revised: 04/26/2016 Document Reviewed: 02/22/2016 Elsevier Interactive Patient Education  2018 Elsevier Inc.  

## 2017-07-24 ENCOUNTER — Encounter: Payer: Self-pay | Admitting: Gastroenterology

## 2017-07-24 NOTE — Telephone Encounter (Signed)
Colonoscopy scheduled.

## 2017-08-12 ENCOUNTER — Other Ambulatory Visit: Payer: Self-pay

## 2017-08-12 ENCOUNTER — Ambulatory Visit (INDEPENDENT_AMBULATORY_CARE_PROVIDER_SITE_OTHER): Payer: PPO

## 2017-08-12 ENCOUNTER — Ambulatory Visit (INDEPENDENT_AMBULATORY_CARE_PROVIDER_SITE_OTHER): Payer: PPO | Admitting: Family Medicine

## 2017-08-12 ENCOUNTER — Encounter: Payer: Self-pay | Admitting: Family Medicine

## 2017-08-12 VITALS — BP 118/70 | HR 69 | Temp 98.3°F | Resp 16 | Ht 66.14 in | Wt 152.0 lb

## 2017-08-12 DIAGNOSIS — J301 Allergic rhinitis due to pollen: Secondary | ICD-10-CM

## 2017-08-12 DIAGNOSIS — Z23 Encounter for immunization: Secondary | ICD-10-CM

## 2017-08-12 DIAGNOSIS — M25552 Pain in left hip: Secondary | ICD-10-CM

## 2017-08-12 DIAGNOSIS — Z0000 Encounter for general adult medical examination without abnormal findings: Secondary | ICD-10-CM

## 2017-08-12 DIAGNOSIS — H34832 Tributary (branch) retinal vein occlusion, left eye, with macular edema: Secondary | ICD-10-CM

## 2017-08-12 DIAGNOSIS — M160 Bilateral primary osteoarthritis of hip: Secondary | ICD-10-CM | POA: Diagnosis not present

## 2017-08-12 DIAGNOSIS — E2839 Other primary ovarian failure: Secondary | ICD-10-CM

## 2017-08-12 DIAGNOSIS — I10 Essential (primary) hypertension: Secondary | ICD-10-CM | POA: Diagnosis not present

## 2017-08-12 DIAGNOSIS — Z1231 Encounter for screening mammogram for malignant neoplasm of breast: Secondary | ICD-10-CM | POA: Diagnosis not present

## 2017-08-12 DIAGNOSIS — R918 Other nonspecific abnormal finding of lung field: Secondary | ICD-10-CM

## 2017-08-12 DIAGNOSIS — R3129 Other microscopic hematuria: Secondary | ICD-10-CM | POA: Diagnosis not present

## 2017-08-12 DIAGNOSIS — Z131 Encounter for screening for diabetes mellitus: Secondary | ICD-10-CM | POA: Diagnosis not present

## 2017-08-12 DIAGNOSIS — E785 Hyperlipidemia, unspecified: Secondary | ICD-10-CM | POA: Diagnosis not present

## 2017-08-12 DIAGNOSIS — N200 Calculus of kidney: Secondary | ICD-10-CM

## 2017-08-12 LAB — POCT URINALYSIS DIP (MANUAL ENTRY)
BILIRUBIN UA: NEGATIVE
BILIRUBIN UA: NEGATIVE mg/dL
Glucose, UA: NEGATIVE mg/dL
LEUKOCYTES UA: NEGATIVE
NITRITE UA: NEGATIVE
PH UA: 5.5 (ref 5.0–8.0)
PROTEIN UA: NEGATIVE mg/dL
Spec Grav, UA: >=1.03 — AB (ref 1.010–1.025)
UROBILINOGEN UA: 0.2 U/dL

## 2017-08-12 MED ORDER — ZOSTER VAC RECOMB ADJUVANTED 50 MCG/0.5ML IM SUSR: 0.5000 mL | Freq: Once | INTRAMUSCULAR | 1 refills | Status: AC

## 2017-08-12 MED ORDER — SIMVASTATIN 40 MG PO TABS: 40.0000 mg | ORAL_TABLET | Freq: Every day | ORAL | 3 refills | Status: DC

## 2017-08-12 MED ORDER — ZOSTER VAC RECOMB ADJUVANTED 50 MCG/0.5ML IM SUSR: 0.5000 mL | Freq: Once | INTRAMUSCULAR | 1 refills | Status: DC

## 2017-08-12 MED ORDER — HYDROCHLOROTHIAZIDE 25 MG PO TABS: 25.0000 mg | ORAL_TABLET | Freq: Every day | ORAL | 3 refills | Status: DC

## 2017-08-12 NOTE — Progress Notes (Signed)
Subjective:    Patient ID: Lori Cook, female    DOB: 08-23-1951, 66 y.o.   MRN: [id number]  08/12/2017  Medicare Wellness; Hypertension; and Hyperlipidemia    HPI This 66 y.o. female presents for evaluation of Welcome to Medicare Visit and follow-up of chronic medical conditions.  Last physical:  More than five years. Pap smear: hysterectomy fibroid; no abnormal pap smears.  No ovaries. Mammogram:  01/2016 [facility] Colonoscopy:  Scheduled next week. [facility] completed ten years ago.   Bone density:  due Eye exam:  glasses Dental exam:   Visual Acuity Screening   Right eye Left eye Both eyes  Without correction:     With correction: 20/20 20/20 20/20      BP Readings from Last 3 Encounters:  08/12/17 118/70  07/22/17 132/88  12/24/16 126/76   Wt Readings from Last 3 Encounters:  08/12/17 152 lb (68.9 kg)  07/22/17 152 lb 3.2 oz (69 kg)  12/24/16 151 lb 6 oz (68.7 kg)   Immunization History  Administered Date(s) Administered  . Influenza Split 07/28/2015  . Influenza, Seasonal, Injecte, Preservative Fre 09/08/2012, 09/19/2013  . Influenza,inj,Quad PF,6+ Mos 05/07/2016, 07/22/2017  . Pneumococcal Polysaccharide-23 11/09/2013  . Tdap 11/09/2013  . Zoster 09/12/2013   HTN: Patient reports good compliance with medication, good tolerance to medication, and good symptom control.   Lung nodules: due for CT chest.  Hypercholesterolemia: Patient reports good compliance with medication, good tolerance to medication, and good symptom control.    L hip pain: new onset; no injury; no associated lower back pain.  No radiation into leg.  Denies n/t/w in legs.  No saddle paresthesias; no focal weakness.   Review of Systems  Constitutional: Negative for activity change, appetite change, chills, diaphoresis, fatigue, fever and unexpected weight change.  HENT: Negative for congestion, dental problem, drooling, ear discharge, ear pain, facial swelling, hearing loss,  mouth sores, nosebleeds, postnasal drip, rhinorrhea, sinus pressure, sneezing, sore throat, tinnitus, trouble swallowing and voice change.   Eyes: Negative for photophobia, pain, discharge, redness, itching and visual disturbance.  Respiratory: Negative for apnea, cough, choking, chest tightness, shortness of breath, wheezing and stridor.   Cardiovascular: Negative for chest pain, palpitations and leg swelling.  Gastrointestinal: Negative for abdominal distention, abdominal pain, anal bleeding, blood in stool, constipation, diarrhea, nausea, rectal pain and vomiting.  Endocrine: Negative for cold intolerance, heat intolerance, polydipsia, polyphagia and polyuria.  Genitourinary: Positive for urgency. Negative for decreased urine volume, difficulty urinating, dyspareunia, dysuria, enuresis, flank pain, frequency, genital sores, hematuria, menstrual problem, pelvic pain, vaginal bleeding, vaginal discharge and vaginal pain.       Nocturia x 3.  History of bladder tact; [place].  Urinary incontinence.  Musculoskeletal: Positive for arthralgias. Negative for back pain, gait problem, joint swelling, myalgias, neck pain and neck stiffness.  Skin: Negative for color change, pallor, rash and wound.  Allergic/Immunologic: Negative for environmental allergies, food allergies and immunocompromised state.  Neurological: Negative for dizziness, tremors, seizures, syncope, facial asymmetry, speech difficulty, weakness, light-headedness, numbness and headaches.  Hematological: Negative for adenopathy. Does not bruise/bleed easily.  Psychiatric/Behavioral: Negative for agitation, behavioral problems, confusion, decreased concentration, dysphoric mood, hallucinations, self-injury, sleep disturbance and suicidal ideas. The patient is not nervous/anxious and is not hyperactive.        Bedtime [phone]; wakes up 530.    Past Medical History:  Diagnosis Date  . Allergy   . Blood transfusion without reported  diagnosis   . Cholelithiasis    large stone detected  by CT 01/2016  . Hyperlipidemia   . Hypertension   . Nephrolithiasis    33mm kidney stone L by CT 01/2016  . 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 Stroke [facility])    Past Surgical History:  Procedure Laterality Date  . ABDOMINAL HYSTERECTOMY    . APPENDECTOMY    . CHOLECYSTECTOMY    . SPINE SURGERY     Allergies  Allergen Reactions  . Codeine Other (See Comments)    Headache   Current Outpatient Medications on File Prior to Visit  Medication Sig Dispense Refill  . acetaminophen (TYLENOL) 500 MG chewable tablet Chew 1,000 mg by mouth every 6 (six) hours as needed for pain. Reported on 11/28/2015    . naproxen (NAPROSYN) 500 MG tablet Take 500 mg by mouth 2 (two) times daily with a meal.    . ranitidine (ZANTAC) 75 MG tablet Take 75 mg by mouth 2 (two) times daily.     No current facility-administered medications on file prior to visit.    Social History   Socioeconomic History  . Marital status: Single    Spouse name: Not on file  . Number of children: Not on file  . Years of education: Not on file  . Highest education level: Not on file  Social Needs  . Financial resource strain: Not on file  . Food insecurity - worry: Not on file  . Food insecurity - inability: Not on file  . Transportation needs - medical: Not on file  . Transportation needs - non-medical: Not on file  Occupational History  . Not on file  Tobacco Use  . Smoking status: Former Smoker    Last attempt to quit: 09/08/2010    Years since quitting: 6.9  . Smokeless tobacco: Never Used  Substance and Sexual Activity  . Alcohol use: No  . Drug use: No  . Sexual activity: Not on file  Other Topics Concern  . Not on file  Social History Narrative   Marital status: single; not dating; not interested in 2018     Children: 2 daughters, 1 son passed away 20.40 years old.  Two grandchildren (20,8)      Lives: with daughter, grandson.      Employment:   Education administrator business full time; one employee      Tobacco: quit smoking      Alcohol:  None      Exercise: no formal exercise      ADLs: independent with ADLs; drives; no assistant devices.       Advanced Directives: none in 2018.  FULL CODE; no prolonged measures. HCPOA: daughters   Family History  Problem Relation Age of Onset  . Stroke Father   . Diabetes Father   . Heart disease Father 32       AMI  . Hypertension Sister   . Arthritis Brother   . Mental illness Mother   . Heart disease Paternal Uncle        Objective:    BP 118/70   Pulse 69   Temp 98.3 F (36.8 C) (Oral)   Resp 16   Ht 5' 6.14" (1.68 m)   Wt 152 lb (68.9 kg)   SpO2 98%   BMI 24.43 kg/m  Physical Exam  Constitutional: She is oriented to person, place, and time. She appears well-developed and well-nourished. No distress.  HENT:  Head: Normocephalic and atraumatic.  Right Ear: External ear normal.  Left Ear: External ear normal.  Nose: Nose normal.  Mouth/Throat: Oropharynx is clear and moist.  Eyes: Conjunctivae and EOM are normal. Pupils are equal, round, and reactive to light.  Neck: Normal range of motion and full passive range of motion without pain. Neck supple. No JVD present. Carotid bruit is not present. No thyromegaly present.  Cardiovascular: Normal rate, regular rhythm and normal heart sounds. Exam reveals no gallop and no friction rub.  No murmur heard. Pulmonary/Chest: Effort normal and breath sounds normal. She has no wheezes. She has no rales. Right breast exhibits no inverted nipple, no mass, no nipple discharge, no skin change and no tenderness. Left breast exhibits no inverted nipple, no mass, no nipple discharge, no skin change and no tenderness. Breasts are symmetrical.  Abdominal: Soft. Bowel sounds are normal. She exhibits no distension and no mass. There is no tenderness. There is no rebound and no guarding.  Musculoskeletal:       Right shoulder: Normal.       Left shoulder:  Normal.       Left hip: She exhibits decreased range of motion. She exhibits normal strength, no tenderness and no laceration.       Cervical back: Normal.       Lumbar back: Normal. She exhibits normal range of motion and no tenderness.  Lymphadenopathy:    She has no cervical adenopathy.  Neurological: She is alert and oriented to person, place, and time. She has normal reflexes. No cranial nerve deficit. She exhibits normal muscle tone. Coordination normal.  Skin: Skin is warm and dry. No rash noted. She is not diaphoretic. No erythema. No pallor.  Psychiatric: She has a normal mood and affect. Her behavior is normal. Judgment and thought content normal.  Nursing note and vitals reviewed.  No results found. Depression screen [facility] 2/9 08/12/2017 07/22/2017 12/24/2016 07/06/2016 05/07/2016  Decreased Interest 0 0 0 0 0  Down, Depressed, Hopeless 0 0 0 0 0  PHQ - 2 Score 0 0 0 0 0   Fall Risk  08/12/2017 07/22/2017 12/24/2016 07/06/2016 05/07/2016  Falls in the past year? No No No No No   Functional Status Survey: Is the patient deaf or have difficulty hearing?: No Does the patient have difficulty seeing, even when wearing glasses/contacts?: No Does the patient have difficulty concentrating, remembering, or making decisions?: No Does the patient have difficulty walking or climbing stairs?: No Does the patient have difficulty dressing or bathing?: No Does the patient have difficulty doing errands alone such as visiting a doctor's office or shopping?: No      Assessment & Plan:   1. Welcome to Medicare preventive visit   2. Essential hypertension, benign   3. 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 Seasonal allergic rhinitis due to pollen   5. Hematuria, microscopic   6. Nephrolithiasis   7. Hyperlipidemia with target LDL less than 130   8. Pulmonary nodules   9. Screening for diabetes mellitus   10. Estrogen deficiency   11. Encounter for screening  mammogram for breast cancer   12. Need for prophylactic vaccination against Streptococcus pneumoniae (pneumococcus)   13. Need for shingles vaccine   14. Left hip pain   15. Hyperlipidemia, unspecified hyperlipidemia type   16. Abnormal findings on diagnostic imaging of lung    -anticipatory guidance provided --- exercise, weight loss, safe driving practices, aspirin 81mg  daily. -obtain age appropriate screening labs and labs for chronic disease management. -moderate fall risk; no evidence of depression; no evidence of hearing  loss.  Discussed advanced directives and living will; also discussed end of life issues including code status.  -New onset hip pain; obtain hip films LEFT; recommend rest, icing, stretching, antiinflammatories.   -refer for CT chest to reevaluate lung nodules. -controlled hypertension and hypercholesterolemia; obtain labs; refills provided.  Follow-up in six months.    Orders Placed This Encounter  Procedures  . DG Bone Density    Standing Status:   Future    Standing Expiration Date:   10/13/2018    Order Specific Question:   Reason for Exam (SYMPTOM  OR DIAGNOSIS REQUIRED)    Answer:   estrogen deficiency    Order Specific Question:   Preferred imaging location?    Answer:   [facility]  . MM DIGITAL SCREENING BILATERAL    Standing Status:   Future    Standing Expiration Date:   10/13/2018    Order Specific Question:   Reason for Exam (SYMPTOM  OR DIAGNOSIS REQUIRED)    Answer:   screening for breast cancer    Order Specific Question:   Preferred imaging location?    Answer:   [facility]  . DG HIP UNILAT W OR W/O PELVIS 2-3 VIEWS LEFT    Standing Status:   Future    Number of Occurrences:   1    Standing Expiration Date:   08/12/2018    Order Specific Question:   Reason for Exam (SYMPTOM  OR DIAGNOSIS REQUIRED)    Answer:   L hip pain    Order Specific Question:   Preferred imaging location?    Answer:   [facility]  . CT Chest Wo Contrast     Standing Status:   Future    Standing Expiration Date:   10/13/2018    Order Specific Question:   Preferred imaging location?    Answer:   [facility]    Order Specific Question:   Radiology Contrast Protocol - do NOT remove file path    Answer:   [email or web address]  . Pneumococcal conjugate vaccine 13-valent IM  . CBC with Differential/Platelet  . Comprehensive metabolic panel    Order Specific Question:   Has the patient fasted?    Answer:   No  . Hemoglobin A1c  . Lipid panel    Order Specific Question:   Has the patient fasted?    Answer:   No  . TSH  . POCT urinalysis dipstick  . EKG 12-Lead   Meds ordered this encounter  Medications  . DISCONTD: Zoster Vaccine Adjuvanted [facility]) injection    Sig: Inject 0.5 mLs into the muscle once for 1 dose.    Dispense:  0.5 mL    Refill:  1  . simvastatin (ZOCOR) 40 MG tablet    Sig: Take 1 tablet (40 mg total) by mouth daily with breakfast.    Dispense:  90 tablet    Refill:  3    Due for cholesterol check  . hydrochlorothiazide (HYDRODIURIL) 25 MG tablet    Sig: Take 1 tablet (25 mg total) by mouth daily.    Dispense:  90 tablet    Refill:  3    Return in about 6 months (around 02/10/2018) for follow-up chronic medical conditions.   Layton Naves Elayne Guerin, M.D. Primary Care at [facility] previously [facility] [address] [place],   [id number] [phone] phone [phone] fax

## 2017-08-12 NOTE — Patient Instructions (Signed)
     IF you received an x-ray today, you will receive an invoice from [facility]. Please contact [facility] at [phone] with questions or concerns regarding your invoice.   IF you received labwork today, you will receive an invoice from [facility]. Please contact [facility] at [phone] with questions or concerns regarding your invoice.   Our billing staff will not be able to assist you with questions regarding bills from these companies.  You will be contacted with the lab results as soon as they are available. The fastest way to get your results is to activate your My Chart account. Instructions are located on the last page of this paperwork. If you have not heard from us regarding the results in 2 weeks, please contact this office.     

## 2017-08-13 LAB — HEMOGLOBIN A1C
ESTIMATED AVERAGE GLUCOSE: 114 mg/dL
HEMOGLOBIN A1C: 5.6 % (ref 4.8–5.6)

## 2017-08-13 LAB — LIPID PANEL
CHOLESTEROL TOTAL: 144 mg/dL (ref 100–199)
Chol/HDL Ratio: 2.6 ratio (ref 0.0–4.4)
HDL: 56 mg/dL (ref >39)
LDL Calculated: 72 mg/dL (ref 0–99)
Triglycerides: 79 mg/dL (ref 0–149)
VLDL Cholesterol Cal: 16 mg/dL (ref 5–40)

## 2017-08-13 LAB — CBC WITH DIFFERENTIAL/PLATELET
Basophils Absolute: 0 10*3/uL (ref 0.0–0.2)
Basos: 0 %
EOS (ABSOLUTE): 0.2 10*3/uL (ref 0.0–0.4)
Eos: 2 %
Hematocrit: 40.7 % (ref 34.0–46.6)
Hemoglobin: 13.3 g/dL (ref 11.1–15.9)
IMMATURE GRANULOCYTES: 0 %
Immature Grans (Abs): 0 10*3/uL (ref 0.0–0.1)
Lymphocytes Absolute: 2.4 10*3/uL (ref 0.7–3.1)
Lymphs: 29 %
MCH: 29.8 pg (ref 26.6–33.0)
MCHC: 32.7 g/dL (ref 31.5–35.7)
MCV: 91 fL (ref 79–97)
MONOS ABS: 0.9 10*3/uL (ref 0.1–0.9)
Monocytes: 11 %
NEUTROS PCT: 58 %
Neutrophils Absolute: 4.7 10*3/uL (ref 1.4–7.0)
PLATELETS: 319 10*3/uL (ref 150–379)
RBC: 4.46 x10E6/uL (ref 3.77–5.28)
RDW: 13.6 % (ref 12.3–15.4)
WBC: 8.1 10*3/uL (ref 3.4–10.8)

## 2017-08-13 LAB — COMPREHENSIVE METABOLIC PANEL
A/G RATIO: 2 (ref 1.2–2.2)
ALK PHOS: 76 IU/L (ref 39–117)
ALT: 13 IU/L (ref 0–32)
AST: 20 IU/L (ref 0–40)
Albumin: 4.4 g/dL (ref 3.6–4.8)
BUN/Creatinine Ratio: 30 — ABNORMAL HIGH (ref 12–28)
BUN: 25 mg/dL (ref 8–27)
Bilirubin Total: 0.4 mg/dL (ref 0.0–1.2)
CALCIUM: 9.9 mg/dL (ref 8.7–10.3)
CO2: 24 mmol/L (ref 20–29)
CREATININE: 0.82 mg/dL (ref 0.57–1.00)
Chloride: 104 mmol/L (ref 96–106)
GFR calc Af Amer: 87 mL/min/{1.73_m2} (ref >59)
GFR, EST NON AFRICAN AMERICAN: 75 mL/min/{1.73_m2} (ref >59)
GLOBULIN, TOTAL: 2.2 g/dL (ref 1.5–4.5)
Glucose: 94 mg/dL (ref 65–99)
POTASSIUM: 4.2 mmol/L (ref 3.5–5.2)
SODIUM: 143 mmol/L (ref 134–144)
Total Protein: 6.6 g/dL (ref 6.0–8.5)

## 2017-08-13 LAB — TSH: TSH: 2.25 u[IU]/mL (ref 0.450–4.500)

## 2017-08-24 ENCOUNTER — Ambulatory Visit (AMBULATORY_SURGERY_CENTER): Payer: Self-pay | Admitting: *Deleted

## 2017-08-24 ENCOUNTER — Other Ambulatory Visit: Payer: Self-pay

## 2017-08-24 VITALS — Ht 66.0 in | Wt 154.0 lb

## 2017-08-24 DIAGNOSIS — Z1211 Encounter for screening for malignant neoplasm of colon: Secondary | ICD-10-CM

## 2017-08-24 MED ORDER — PEG-KCL-NACL-NASULF-NA ASC-C 140 G PO SOLR: 1.0000 | ORAL | 0 refills | Status: DC

## 2017-08-24 NOTE — Progress Notes (Signed)
No egg or soy allergy known to patient  No issues with past sedation with any surgeries  or procedures, no intubation problems  No diet pills per patient No home 02 use per patient  No blood thinners per patient  Pt denies issues with constipation  No A fib or A flutter  EMMI video sent to pt's e mail --  Samples of this drug were given to the patient, quantity plenvu kit , Lot Number [id number]  02-2019 use as directed

## 2017-08-25 ENCOUNTER — Encounter: Payer: Self-pay | Admitting: Gastroenterology

## 2017-08-29 ENCOUNTER — Ambulatory Visit
Admission: RE | Admit: 2017-08-29 | Discharge: 2017-08-29 | Disposition: A | Discharge status: Home / Self Care | Payer: PPO | Source: Ambulatory Visit | Attending: Family Medicine | Admitting: Family Medicine

## 2017-08-29 DIAGNOSIS — R918 Other nonspecific abnormal finding of lung field: Secondary | ICD-10-CM | POA: Diagnosis not present

## 2017-09-07 ENCOUNTER — Encounter: Payer: Self-pay | Admitting: Gastroenterology

## 2017-09-07 ENCOUNTER — Ambulatory Visit (AMBULATORY_SURGERY_CENTER): Payer: PPO | Admitting: Gastroenterology

## 2017-09-07 VITALS — BP 119/71 | HR 62 | Temp 97.3°F | Resp 14 | Ht 66.0 in | Wt 152.0 lb

## 2017-09-07 DIAGNOSIS — D125 Benign neoplasm of sigmoid colon: Secondary | ICD-10-CM | POA: Diagnosis not present

## 2017-09-07 DIAGNOSIS — Z1211 Encounter for screening for malignant neoplasm of colon: Secondary | ICD-10-CM | POA: Diagnosis not present

## 2017-09-07 DIAGNOSIS — D123 Benign neoplasm of transverse colon: Secondary | ICD-10-CM | POA: Diagnosis not present

## 2017-09-07 DIAGNOSIS — I10 Essential (primary) hypertension: Secondary | ICD-10-CM | POA: Diagnosis not present

## 2017-09-07 DIAGNOSIS — K639 Disease of intestine, unspecified: Secondary | ICD-10-CM | POA: Diagnosis not present

## 2017-09-07 DIAGNOSIS — Z1212 Encounter for screening for malignant neoplasm of rectum: Secondary | ICD-10-CM

## 2017-09-07 DIAGNOSIS — D127 Benign neoplasm of rectosigmoid junction: Secondary | ICD-10-CM | POA: Diagnosis not present

## 2017-09-07 MED ORDER — SODIUM CHLORIDE 0.9 % IV SOLN: 500.0000 mL | Freq: Once | INTRAVENOUS | Status: DC

## 2017-09-07 NOTE — Op Note (Signed)
[facility] Patient Name: Lori Cook Procedure Date: 09/07/2017 2:48 PM MRN: [id number] Endoscopist: Remo Lipps P. Armbruster MD, MD Age: 66 Referring MD:  Date of Birth: 27-Oct-1950 Gender: Female Account #: [id number] Procedure:                Colonoscopy Indications:              Screening for colorectal malignant neoplasm Medicines:                Monitored Anesthesia Care Procedure:                Pre-Anesthesia Assessment:                           - Prior to the procedure, a History and Physical                            was performed, and patient medications and                            allergies were reviewed. The patient's tolerance of                            previous anesthesia was also reviewed. The risks                            and benefits of the procedure and the sedation                            options and risks were discussed with the patient.                            All questions were answered, and informed consent                            was obtained. Prior Anticoagulants: The patient has                            taken no previous anticoagulant or antiplatelet                            agents. ASA Grade Assessment: II - A patient with                            mild systemic disease. After reviewing the risks                            and benefits, the patient was deemed in                            satisfactory condition to undergo the procedure.                           After obtaining informed consent, the colonoscope  was passed under direct vision. Throughout the                            procedure, the patient's blood pressure, pulse, and                            oxygen saturations were monitored continuously. The                            Colonoscope was introduced through the anus and                            advanced to the the cecum, identified by                            appendiceal orifice  and ileocecal valve. The                            colonoscopy was performed without difficulty. The                            patient tolerated the procedure well. The quality                            of the bowel preparation was good. The ileocecal                            valve, appendiceal orifice, and rectum were                            photographed. Scope In: 2:52:26 PM Scope Out: 3:25:24 PM Scope Withdrawal Time: 0 hours 27 minutes 53 seconds  Total Procedure Duration: 0 hours 32 minutes 58 seconds  Findings:                 The perianal and digital rectal examinations were                            normal.                           The terminal ileum appeared normal.                           Localized mild inflammation characterized by                            erosions, erythema and friability was found in the                            cecum. Biopsies were taken with a cold forceps for                            histology.  The colon was tortuous with a very angulated                            rectosigmoid colon.                           A 5 mm polyp was found in the transverse colon. The                            polyp was sessile. The polyp was removed with a                            cold snare. Resection and retrieval were complete.                           Two sessile polyps were found in the sigmoid colon.                            The polyps were 4 to 5 mm in size. These polyps                            were removed with a cold snare. Resection and                            retrieval were complete.                           A 4 mm polyp was found in the recto-sigmoid colon.                            The polyp was sessile. The polyp was removed with a                            cold snare. Resection and retrieval were complete.                           Internal hemorrhoids were found during                             retroflexion. The hemorrhoids were small.                           The exam was otherwise without abnormality. Complications:            No immediate complications. Estimated blood loss:                            Minimal. Estimated Blood Loss:     Estimated blood loss was minimal. Impression:               - The examined portion of the ileum was normal.                           - Localized mild inflammation was found in  the                            cecum. Biopsied.                           - Tortuous colon.                           - One 5 mm polyp in the transverse colon, removed                            with a cold snare. Resected and retrieved.                           - Two 4 to 5 mm polyps in the sigmoid colon,                            removed with a cold snare. Resected and retrieved.                           - One 4 mm polyp at the recto-sigmoid colon,                            removed with a cold snare. Resected and retrieved.                           - Internal hemorrhoids.                           - The examination was otherwise normal. Recommendation:           - Patient has a contact number available for                            emergencies. The signs and symptoms of potential                            delayed complications were discussed with the                            patient. Return to normal activities tomorrow.                            Written discharge instructions were provided to the                            patient.                           - Resume previous diet.                           - Continue present medications.                           -  Await pathology results.                           - Repeat colonoscopy is recommended for                            surveillance. The colonoscopy date will be                            determined after pathology results from today's                            exam become available for  review.                           - No ibuprofen, naproxen, or other non-steroidal                            anti-inflammatory drugs for 2 weeks after polyp                            removal. Remo Lipps P. Armbruster MD, MD 09/07/2017 3:31:00 PM This report has been signed electronically.

## 2017-09-07 NOTE — Progress Notes (Signed)
To recovery, report to RN, VSS. 

## 2017-09-07 NOTE — Progress Notes (Signed)
Called to room to assist during endoscopic procedure.  Patient ID and intended procedure confirmed with present staff. Received instructions for my participation in the procedure from the performing physician.  

## 2017-09-07 NOTE — Patient Instructions (Signed)
YOU HAD AN ENDOSCOPIC PROCEDURE TODAY AT [facility]:   Refer to the procedure report that was given to you for any specific questions about what was found during the examination.  If the procedure report does not answer your questions, please call your gastroenterologist to clarify.  If you requested that your care partner not be given the details of your procedure findings, then the procedure report has been included in a sealed envelope for you to review at your convenience later.  YOU SHOULD EXPECT: Some feelings of bloating in the abdomen. Passage of more gas than usual.  Walking can help get rid of the air that was put into your GI tract during the procedure and reduce the bloating. If you had a lower endoscopy (such as a colonoscopy or flexible sigmoidoscopy) you may notice spotting of blood in your stool or on the toilet paper. If you underwent a bowel prep for your procedure, you may not have a normal bowel movement for a few days.  Please Note:  You might notice some irritation and congestion in your nose or some drainage.  This is from the oxygen used during your procedure.  There is no need for concern and it should clear up in a day or so.  SYMPTOMS TO REPORT IMMEDIATELY:   Following lower endoscopy (colonoscopy or flexible sigmoidoscopy):  Excessive amounts of blood in the stool  Significant tenderness or worsening of abdominal pains  Swelling of the abdomen that is new, acute  Fever of 100F or higher  For urgent or emergent issues, a gastroenterologist can be reached at any hour by calling [phone].   DIET:  We do recommend a small meal at first, but then you may proceed to your regular diet.  Drink plenty of fluids but you should avoid alcoholic beverages for 24 hours.  ACTIVITY:  You should plan to take it easy for the rest of today and you should NOT DRIVE or use heavy machinery until tomorrow (because of the sedation medicines used during the test).     FOLLOW UP: Our staff will call the number listed on your records the next business day following your procedure to check on you and address any questions or concerns that you may have regarding the information given to you following your procedure. If we do not reach you, we will leave a message.  However, if you are feeling well and you are not experiencing any problems, there is no need to return our call.  We will assume that you have returned to your regular daily activities without incident.  If any biopsies were taken you will be contacted by phone or by letter within the next 1-3 weeks.  Please call us at [phone] if you have not heard about the biopsies in 3 weeks.   Await for biopsy results to determine next repeat Colonoscopy screening Polyps (handout given) Hemorrhoids (handout given) .Marland KitchenNo ibuprofen, naproxen or other non-steroidal anti-inflammatory drugs for 2 weeks after polyp removal. Tylenol okay if needed.   SIGNATURES/CONFIDENTIALITY: You and/or your care partner have signed paperwork which will be entered into your electronic medical record.  These signatures attest to the fact that that the information above on your After Visit Summary has been reviewed and is understood.  Full responsibility of the confidentiality of this discharge information lies with you and/or your care-partner.

## 2017-09-08 ENCOUNTER — Telehealth: Payer: Self-pay

## 2017-09-08 NOTE — Telephone Encounter (Signed)
Left message on answering machine. 

## 2017-09-14 ENCOUNTER — Encounter: Payer: Self-pay | Admitting: Gastroenterology

## 2018-02-06 ENCOUNTER — Encounter: Payer: Self-pay | Admitting: Family Medicine

## 2018-03-03 ENCOUNTER — Encounter: Payer: Self-pay | Admitting: Family Medicine

## 2018-03-03 DIAGNOSIS — Z1231 Encounter for screening mammogram for malignant neoplasm of breast: Secondary | ICD-10-CM | POA: Diagnosis not present

## 2018-04-02 DIAGNOSIS — Z0100 Encounter for examination of eyes and vision without abnormal findings: Secondary | ICD-10-CM | POA: Diagnosis not present

## 2018-08-23 ENCOUNTER — Other Ambulatory Visit: Payer: Self-pay | Admitting: Family Medicine

## 2018-08-23 DIAGNOSIS — E785 Hyperlipidemia, unspecified: Secondary | ICD-10-CM

## 2018-08-23 NOTE — Telephone Encounter (Signed)
Copied from [place] [id number]. Topic: Quick Communication - See Telephone Encounter >> Aug 23, 2018 12:45 PM Ivar Drape wrote: CRM for notification. See Telephone encounter for: 08/23/18. Patient would like refills on the following prescriptions and have them sent to her preferred [facility] in [place].  1) Simvastatin (ZOCOR) 40 MG tablet 2) Hydrochlorothiazide (HYDRODIURIL) 25 MG tablet

## 2018-08-24 NOTE — Telephone Encounter (Signed)
Attempted to call patient and schedule an appointment for her refills. No answer, left message to call back and schedule. LOV  08/12/17 Former pt of K. Tamala Julian

## 2018-08-24 NOTE — Telephone Encounter (Signed)
Patient scheduled for [facility] appointment with Dr. Volanda Napoleon on 1/14 and requesting a partial refill of these medications until visit. Please advise.

## 2018-08-29 ENCOUNTER — Ambulatory Visit (INDEPENDENT_AMBULATORY_CARE_PROVIDER_SITE_OTHER): Payer: PPO | Admitting: Family Medicine

## 2018-08-29 ENCOUNTER — Other Ambulatory Visit: Payer: Self-pay

## 2018-08-29 ENCOUNTER — Encounter: Payer: Self-pay | Admitting: Family Medicine

## 2018-08-29 VITALS — BP 138/77 | HR 60 | Temp 98.2°F | Resp 16 | Ht 66.0 in | Wt 152.4 lb

## 2018-08-29 DIAGNOSIS — Z5181 Encounter for therapeutic drug level monitoring: Secondary | ICD-10-CM

## 2018-08-29 DIAGNOSIS — E785 Hyperlipidemia, unspecified: Secondary | ICD-10-CM

## 2018-08-29 DIAGNOSIS — I10 Essential (primary) hypertension: Secondary | ICD-10-CM | POA: Diagnosis not present

## 2018-08-29 DIAGNOSIS — Z13820 Encounter for screening for osteoporosis: Secondary | ICD-10-CM

## 2018-08-29 DIAGNOSIS — E2839 Other primary ovarian failure: Secondary | ICD-10-CM | POA: Diagnosis not present

## 2018-08-29 NOTE — Progress Notes (Signed)
Established Patient Office Visit  Subjective:  Patient ID: Lori Cook, female    DOB: 1951-05-02  Age: 67 y.o. MRN: [id number]  CC:  Chief Complaint  Patient presents with  . transition of care-former Lori Cook patient  . Medication Refill    simvastatin and hydrochlorithiazide 90 day supply preferred    HPI Lori Cook presents for   Hypertension: Patient here for follow-up of elevated blood pressure. She is exercising and is adherent to low salt diet.  Blood pressure is well controlled at home. Cardiac symptoms none. Patient denies chest pain, chest pressure/discomfort, dyspnea, exertional chest pressure/discomfort, fatigue and irregular heart beat.  Cardiovascular risk factors: dyslipidemia and hypertension. Use of agents associated with hypertension: none. History of target organ damage: none.  BP Readings from Last 3 Encounters:  08/29/18 138/77  09/07/17 119/71  08/12/17 118/70     Dyslipidemia: Patient presents for evaluation of lipids.  Compliance with treatment thus far has been excellent.  A repeat fasting lipid profile was done.  The patient does not use medications that may worsen dyslipidemias (corticosteroids, progestins, anabolic steroids, diuretics, beta-blockers, amiodarone, cyclosporine, olanzapine). The patient exercises intermittently.  The patient is not known to have coexisting coronary artery disease.    The 10-year ASCVD risk score Lori Bussing DC Brooke Bonito., et al., 2013) is: 8.9%   Values used to calculate the score:     Age: 63 years     Sex: Female     Is Non-Hispanic African American: No     Diabetic: No     Tobacco smoker: No     Systolic Blood Pressure: 762 mmHg     Is BP treated: Yes     HDL Cholesterol: 62 mg/dL     Total Cholesterol: 187 mg/dL   She has a history of heart attack in her father She reports that she had a retinal stroke in the past   Past Medical History:  Diagnosis Date  . Allergy   . Arthritis   . Blood transfusion  without reported diagnosis   . Cholelithiasis    large stone detected by CT 01/2016  . GERD (gastroesophageal reflux disease)   . Hyperlipidemia   . Hypertension   . Nephrolithiasis    59m kidney stone L by CT 01/2016  . 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 Stroke ([facility]    in retina only     Past Surgical History:  Procedure Laterality Date  . ABDOMINAL HYSTERECTOMY    . APPENDECTOMY    . CHOLECYSTECTOMY    . COLONOSCOPY     [facility]- records purges and destroyed- per pt Normal   . SPINE SURGERY      Family History  Problem Relation Age of Onset  . Stroke Father   . Diabetes Father   . Heart disease Father 519      AMI  . Hypertension Sister   . Arthritis Brother   . Mental illness Mother   . Heart disease Paternal Uncle   . Colon cancer Neg Hx   . Colon polyps Neg Hx   . Esophageal cancer Neg Hx   . Rectal cancer Neg Hx   . Stomach cancer Neg Hx     Social History   Socioeconomic History  . Marital status: Single    Spouse name: Not on file  . Number of children: Not on file  . Years of education: Not on file  . Highest education level: Not on file  Occupational History  .  Not on file  Social Needs  . Financial resource strain: Not on file  . Food insecurity:    Worry: Not on file    Inability: Not on file  . Transportation needs:    Medical: Not on file    Non-medical: Not on file  Tobacco Use  . Smoking status: Former Smoker    Last attempt to quit: 09/08/2010    Years since quitting: 7.9  . Smokeless tobacco: Never Used  Substance and Sexual Activity  . Alcohol use: No  . Drug use: No  . Sexual activity: Not on file  Lifestyle  . Physical activity:    Days per week: Not on file    Minutes per session: Not on file  . Stress: Not on file  Relationships  . Social connections:    Talks on phone: Not on file    Gets together: Not on file    Attends religious service: Not on file    Active member of club or organization: Not on  file    Attends meetings of clubs or organizations: Not on file    Relationship status: Not on file  . Intimate partner violence:    Fear of current or ex partner: Not on file    Emotionally abused: Not on file    Physically abused: Not on file    Forced sexual activity: Not on file  Other Topics Concern  . Not on file  Social History Narrative   Marital status: single; not dating; not interested in 2018     Children: 2 daughters, 1 son passed away 32.25 years old.  Two grandchildren (20,8)      Lives: with daughter, grandson.      Employment:  Education administrator business full time; one employee      Tobacco: quit smoking      Alcohol:  None      Exercise: no formal exercise      ADLs: independent with ADLs; drives; no assistant devices.       Advanced Directives: none in 2018.  FULL CODE; no prolonged measures. HCPOA: daughters    Outpatient Medications Prior to Visit  Medication Sig Dispense Refill  . acetaminophen (TYLENOL) 500 MG chewable tablet Chew 1,000 mg by mouth every 6 (six) hours as needed for pain. Reported on 11/28/2015    . aspirin 81 MG chewable tablet Chew 81 mg by mouth daily.    . hydrochlorothiazide (HYDRODIURIL) 25 MG tablet Take 1 tablet (25 mg total) by mouth daily. 90 tablet 3  . naproxen (NAPROSYN) 500 MG tablet Take 500 mg by mouth 2 (two) times daily with a meal.    . OVER THE COUNTER MEDICATION Juice plus    . simvastatin (ZOCOR) 40 MG tablet Take 1 tablet (40 mg total) by mouth daily with breakfast. 90 tablet 3  . ranitidine (ZANTAC) 75 MG tablet Take 75 mg by mouth 2 (two) times daily.     Facility-Administered Medications Prior to Visit  Medication Dose Route Frequency Provider Last Rate Last Dose  . 0.9 %  sodium chloride infusion  500 mL Intravenous Once Armbruster, Carlota Raspberry, MD        Allergies  Allergen Reactions  . Codeine Other (See Comments)    Headache    ROS Review of Systems Review of Systems  Constitutional: Negative for activity change,  appetite change, chills and fever.  HENT: Negative for congestion, nosebleeds, trouble swallowing and voice change.   Respiratory: Negative for cough, shortness of breath  and wheezing.   Gastrointestinal: Negative for diarrhea, nausea and vomiting.  Genitourinary: Negative for difficulty urinating, dysuria, flank pain and hematuria.  Musculoskeletal: Negative for back pain, joint swelling and neck pain.  Neurological: Negative for dizziness, speech difficulty, light-headedness and numbness.  See HPI. All other review of systems negative.     Objective:    Physical Exam  BP 138/77 (BP Location: Right Arm, Patient Position: Sitting, Cuff Size: Normal)   Pulse 60   Temp 98.2 F (36.8 C) (Oral)   Resp 16   Ht '5\' 6"'  (1.676 m)   Wt 152 lb 6.4 oz (69.1 kg)   SpO2 99%   BMI 24.60 kg/m  Wt Readings from Last 3 Encounters:  08/29/18 152 lb 6.4 oz (69.1 kg)  09/07/17 152 lb (68.9 kg)  08/24/17 154 lb (69.9 kg)   Physical Exam  Constitutional: Oriented to person, place, and time. Appears well-developed and well-nourished.  HENT:  Head: Normocephalic and atraumatic.  Eyes: Conjunctivae and EOM are normal.  Cardiovascular: Normal rate, regular rhythm, normal heart sounds and intact distal pulses.  No murmur heard. Pulmonary/Chest: Effort normal and breath sounds normal. No stridor. No respiratory distress. Has no wheezes.  Neurological: Is alert and oriented to person, place, and time.  Skin: Skin is warm. Capillary refill takes less than 2 seconds.  Psychiatric: Has a normal mood and affect. Behavior is normal. Judgment and thought content normal.    Health Maintenance Due  Topic Date Due  . DEXA SCAN  05/01/2017    There are no preventive care reminders to display for this patient.  Lab Results  Component Value Date   TSH 2.120 08/29/2018   Lab Results  Component Value Date   WBC 8.1 08/12/2017   HGB 13.3 08/12/2017   HCT 40.7 08/12/2017   MCV 91 08/12/2017   PLT 319  08/12/2017   Lab Results  Component Value Date   NA 144 08/29/2018   K 4.5 08/29/2018   CO2 25 08/29/2018   GLUCOSE 88 08/29/2018   BUN 20 08/29/2018   CREATININE 0.75 08/29/2018   BILITOT 0.4 08/29/2018   ALKPHOS 86 08/29/2018   AST 19 08/29/2018   ALT 17 08/29/2018   PROT 7.1 08/29/2018   ALBUMIN 4.7 08/29/2018   CALCIUM 10.4 (H) 08/29/2018   Lab Results  Component Value Date   CHOL 187 08/29/2018   Lab Results  Component Value Date   HDL 62 08/29/2018   Lab Results  Component Value Date   LDLCALC 101 (H) 08/29/2018   Lab Results  Component Value Date   TRIG 121 08/29/2018   Lab Results  Component Value Date   CHOLHDL 3.0 08/29/2018   Lab Results  Component Value Date   HGBA1C 5.6 08/12/2017      Assessment & Plan:   Problem List Items Addressed This Visit      Cardiovascular and Mediastinum   Essential hypertension, benign - Primary  - Patient's blood pressure is at goal of 139/89 or less. Condition is stable. Continue current medications and treatment plan. I recommend that you exercise for 30-45 minutes 5 days a week. I also recommend a balanced diet with fruits and vegetables every day, lean meats, and little fried foods. The DASH diet (you can find this online) is a good example of this.    Relevant Orders   Lipid panel (Completed)   CMP14+EGFR (Completed)     Other   Hyperlipidemia with target LDL less than 130  - lipid  control being monitored   Relevant Orders   Lipid panel (Completed)   CMP14+EGFR (Completed)    Other Visit Diagnoses    Estrogen deficiency    -  Will check bone density   Relevant Orders   DG Bone Density   Encounter for medication monitoring    - will check levels   Relevant Orders   Lipid panel (Completed)   CMP14+EGFR (Completed)   TSH (Completed)   Screening for osteoporosis    -  Discussed    Relevant Orders   CMP14+EGFR (Completed)   TSH (Completed)   DG Bone Density      No orders of the defined types  were placed in this encounter.   Follow-up: Return in about 6 months (around 02/28/2019) for wellness visit.    Forrest Moron, MD

## 2018-08-29 NOTE — Patient Instructions (Addendum)
We recommend that you schedule a mammogram for breast cancer screening. Typically, you do not need a referral to do this. Please contact a local imaging center to schedule your mammogram.   [facility] ([place]) - [phone] or [phone]     If you have lab work done today you will be contacted with your lab results within the next 2 weeks.  If you have not heard from [place] then please contact us. The fastest way to get your results is to register for My Chart.   IF you received an x-ray today, you will receive an invoice from [facility]. Please contact [facility] at [phone] with questions or concerns regarding your invoice.   IF you received labwork today, you will receive an invoice from [facility]. Please contact [facility] at [phone] with questions or concerns regarding your invoice.   Our billing staff will not be able to assist you with questions regarding bills from these companies.  You will be contacted with the lab results as soon as they are available. The fastest way to get your results is to activate your My Chart account. Instructions are located on the last page of this paperwork. If you have not heard from [place] regarding the results in 2 weeks, please contact this office.     Osteoporosis  Osteoporosis happens when your bones get thin and weak. This can cause your bones to break (fracture) more easily. You can do things at home to make your bones stronger. Follow these instructions at home:  Activity  Exercise as told by your doctor. Ask your doctor what activities are safe for you. You should do: ? Exercises that make your muscles work to hold your body weight up (weight-bearing exercises). These include tai chi, yoga, and walking. ? Exercises to make your muscles stronger. One example is lifting weights. Lifestyle  Limit alcohol intake to no more than 1 drink a day for nonpregnant women and 2 drinks a day for men. One  drink equals 12 oz of beer, 5 oz of wine, or 1 oz of hard liquor.  Do not use any products that have nicotine or tobacco in them. These include cigarettes and e-cigarettes. If you need help quitting, ask your doctor. Preventing falls  Use tools to help you move around (mobility aids) as needed. These include canes, walkers, scooters, and crutches.  Keep rooms well-lit and free of clutter.  Put away things that could make you trip. These include cords and rugs.  Install safety rails on stairs. Install grab bars in bathrooms.  Use rubber mats in slippery areas, like bathrooms.  Wear shoes that: ? Fit you well. ? Support your feet. ? Have closed toes. ? Have rubber soles or low heels.  Tell your doctor about all of the medicines you are taking. Some medicines can make you more likely to fall. General instructions  Eat plenty of calcium and vitamin D. These nutrients are good for your bones. Good sources of calcium and vitamin D include: ? Some fatty fish, such as salmon and tuna. ? Foods that have calcium and vitamin D added to them (fortified foods). For example, some breakfast cereals are fortified with calcium and vitamin D. ? Egg yolks. ? Cheese. ? Liver.  Take over-the-counter and prescription medicines only as told by your doctor.  Keep all follow-up visits as told by your doctor. This is important. Contact a doctor if:  You have not been tested (screened) for osteoporosis and you are: ?  A woman who is age 65 or older. ? A man who is age 70 or older. Get help right away if:  You fall.  You get hurt. Summary  Osteoporosis happens when your bones get thin and weak.  Weak bones can break (fracture) more easily.  Eat plenty of calcium and vitamin D. These nutrients are good for your bones.  Tell your doctor about all of the medicines that you take. This information is not intended to replace advice given to you by your health care provider. Make sure you discuss  any questions you have with your health care provider. Document Released: 11/21/2011 Document Revised: 06/23/2017 Document Reviewed: 06/23/2017 Elsevier Interactive Patient Education  2019 [employer]  

## 2018-08-30 LAB — CMP14+EGFR
A/G RATIO: 2 (ref 1.2–2.2)
ALT: 17 IU/L (ref 0–32)
AST: 19 IU/L (ref 0–40)
Albumin: 4.7 g/dL (ref 3.6–4.8)
Alkaline Phosphatase: 86 IU/L (ref 39–117)
BILIRUBIN TOTAL: 0.4 mg/dL (ref 0.0–1.2)
BUN/Creatinine Ratio: 27 (ref 12–28)
BUN: 20 mg/dL (ref 8–27)
CHLORIDE: 103 mmol/L (ref 96–106)
CO2: 25 mmol/L (ref 20–29)
Calcium: 10.4 mg/dL — ABNORMAL HIGH (ref 8.7–10.3)
Creatinine, Ser: 0.75 mg/dL (ref 0.57–1.00)
GFR calc non Af Amer: 83 mL/min/{1.73_m2} (ref >59)
GFR, EST AFRICAN AMERICAN: 96 mL/min/{1.73_m2} (ref >59)
Globulin, Total: 2.4 g/dL (ref 1.5–4.5)
Glucose: 88 mg/dL (ref 65–99)
POTASSIUM: 4.5 mmol/L (ref 3.5–5.2)
SODIUM: 144 mmol/L (ref 134–144)
TOTAL PROTEIN: 7.1 g/dL (ref 6.0–8.5)

## 2018-08-30 LAB — LIPID PANEL
CHOL/HDL RATIO: 3 ratio (ref 0.0–4.4)
Cholesterol, Total: 187 mg/dL (ref 100–199)
HDL: 62 mg/dL (ref >39)
LDL Calculated: 101 mg/dL — ABNORMAL HIGH (ref 0–99)
TRIGLYCERIDES: 121 mg/dL (ref 0–149)
VLDL Cholesterol Cal: 24 mg/dL (ref 5–40)

## 2018-08-30 LAB — TSH: TSH: 2.12 u[IU]/mL (ref 0.450–4.500)

## 2018-08-31 ENCOUNTER — Other Ambulatory Visit: Payer: Self-pay | Admitting: *Deleted

## 2018-08-31 DIAGNOSIS — E785 Hyperlipidemia, unspecified: Secondary | ICD-10-CM

## 2018-08-31 MED ORDER — SIMVASTATIN 40 MG PO TABS: 40.0000 mg | ORAL_TABLET | Freq: Every day | ORAL | 3 refills | Status: DC

## 2018-08-31 MED ORDER — HYDROCHLOROTHIAZIDE 25 MG PO TABS: 25.0000 mg | ORAL_TABLET | Freq: Every day | ORAL | 3 refills | Status: DC

## 2018-08-31 NOTE — Addendum Note (Signed)
Addended by: Fransisca Connors A on: 08/31/2018 10:47 AM   Modules accepted: Orders

## 2018-08-31 NOTE — Progress Notes (Signed)
Requested Prescriptions  Pending Prescriptions Disp Refills  . hydrochlorothiazide (HYDRODIURIL) 25 MG tablet 90 tablet 3    Sig: Take 1 tablet (25 mg total) by mouth daily.     There is no refill protocol information for this order    . simvastatin (ZOCOR) 40 MG tablet 90 tablet 3    Sig: Take 1 tablet (40 mg total) by mouth daily with breakfast.     There is no refill protocol information for this order

## 2018-08-31 NOTE — Telephone Encounter (Signed)
Pt calling to check on these.  States she had her TOC visit on 12/18, but medications have not been refilled yet. Pt can be reached at [phone]

## 2018-09-25 ENCOUNTER — Ambulatory Visit: Payer: PPO | Admitting: Family Medicine

## 2018-10-24 ENCOUNTER — Ambulatory Visit
Admission: RE | Admit: 2018-10-24 | Discharge: 2018-10-24 | Disposition: A | Discharge status: Home / Self Care | Payer: PPO | Source: Ambulatory Visit | Attending: Family Medicine | Admitting: Family Medicine

## 2018-10-24 DIAGNOSIS — M8589 Other specified disorders of bone density and structure, multiple sites: Secondary | ICD-10-CM | POA: Diagnosis not present

## 2018-10-24 DIAGNOSIS — Z13820 Encounter for screening for osteoporosis: Secondary | ICD-10-CM

## 2018-10-24 DIAGNOSIS — E2839 Other primary ovarian failure: Secondary | ICD-10-CM

## 2018-10-24 DIAGNOSIS — Z780 Asymptomatic menopausal state: Secondary | ICD-10-CM | POA: Diagnosis not present

## 2018-11-21 ENCOUNTER — Other Ambulatory Visit: Payer: Self-pay

## 2018-11-21 ENCOUNTER — Other Ambulatory Visit: Payer: Self-pay | Admitting: *Deleted

## 2018-11-21 ENCOUNTER — Ambulatory Visit (INDEPENDENT_AMBULATORY_CARE_PROVIDER_SITE_OTHER): Payer: PPO | Admitting: Family Medicine

## 2018-11-21 VITALS — BP 126/71 | Ht 66.25 in | Wt 154.3 lb

## 2018-11-21 DIAGNOSIS — Z0001 Encounter for general adult medical examination with abnormal findings: Secondary | ICD-10-CM

## 2018-11-21 DIAGNOSIS — Z09 Encounter for follow-up examination after completed treatment for conditions other than malignant neoplasm: Secondary | ICD-10-CM | POA: Diagnosis not present

## 2018-11-21 DIAGNOSIS — Z23 Encounter for immunization: Secondary | ICD-10-CM

## 2018-11-21 DIAGNOSIS — Z0000 Encounter for general adult medical examination without abnormal findings: Secondary | ICD-10-CM

## 2018-11-21 DIAGNOSIS — H60501 Unspecified acute noninfective otitis externa, right ear: Secondary | ICD-10-CM

## 2018-11-21 MED ORDER — HYDROCORTISONE-ACETIC ACID 1-2 % OT SOLN: 3.0000 [drp] | Freq: Three times a day (TID) | OTIC | 0 refills | Status: DC

## 2018-11-21 NOTE — Progress Notes (Addendum)
Presents today for [employer] Annual Wellness Visit   Patient Care Team: Forrest Moron, MD as PCP - General (Internal Medicine)     Immunization status:  Immunization History  Administered Date(s) Administered  . Influenza Split 07/28/2015  . Influenza, Seasonal, Injecte, Preservative Fre 09/08/2012, 09/19/2013  . Influenza,inj,Quad PF,6+ Mos 05/07/2016, 07/22/2017  . Pneumococcal Conjugate-13 08/12/2017  . Pneumococcal Polysaccharide-23 11/09/2013  . Tdap 11/09/2013  . Zoster 09/12/2013  . Zoster Recombinat (Shingrix) 06/28/2018, 08/30/2018     Health Maintenance Due  Topic Date Due  . PNA vac Low Risk Adult (2 of 2 - PPSV23) 11/09/2018     Functional Status Survey: Is the patient deaf or have difficulty hearing?: No Does the patient have difficulty seeing, even when wearing glasses/contacts?: No Does the patient have difficulty concentrating, remembering, or making decisions?: No Does the patient have difficulty walking or climbing stairs?: No Does the patient have difficulty dressing or bathing?: No Does the patient have difficulty doing errands alone such as visiting a doctor's office or shopping?: No   6CIT Screen 11/21/2018  What month? 0 points  Count back from 20 0 points  Months in reverse 0 points  Repeat phrase 0 points        Clinical Support from 11/21/2018 in Primary Care at [facility]  AUDIT-C Score  0      Patient Active Problem List   Diagnosis Date Noted  . Calculus of gallbladder without cholecystitis without obstruction 05/07/2016  . Nephrolithiasis 05/07/2016  . Pulmonary nodules 05/07/2016  . Hematuria, microscopic 05/07/2016  . Essential hypertension, benign 04/24/2015  . Allergic rhinitis due to pollen 04/24/2015  . Retinal vein occlusion, branch 04/24/2015  . Retinal vein occlusion 03/08/2015  . Essential hypertension-new onset 03/08/2015  . Hyperlipidemia with target LDL less than 130 09/09/2012     Past Medical History:   Diagnosis Date  . Allergy   . Arthritis   . Blood transfusion without reported diagnosis   . Cholelithiasis    large stone detected by CT 01/2016  . GERD (gastroesophageal reflux disease)   . Hyperlipidemia   . Hypertension   . Nephrolithiasis    35mm kidney stone L by CT 01/2016  . 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 ABDOMINAL HYSTERECTOMY    . APPENDECTOMY    . CHOLECYSTECTOMY    . COLONOSCOPY     [facility]- records purges and destroyed- per pt Normal   . SPINE SURGERY       Family History  Problem Relation Age of Onset  . Stroke Father   . Diabetes Father   . Heart disease Father 74       AMI  . Hypertension Sister   . Arthritis Brother   . Mental illness Mother   . Heart disease Paternal Uncle   . Colon cancer Neg Hx   . Colon polyps Neg Hx   . Esophageal cancer Neg Hx   . Rectal cancer Neg Hx   . Stomach cancer Neg Hx      Social History   Socioeconomic History  . Marital status: Single    Spouse name: Not on file  . Number of children: Not on file  . Years of education: Not on file  . Highest education level: Not on file  Occupational History  . Not on file  Social Needs  .  Financial resource strain: Not on file  . Food insecurity:    Worry: Not on file    Inability: Not on file  . Transportation needs:    Medical: Not on file    Non-medical: Not on file  Tobacco Use  . Smoking status: Former Smoker    Last attempt to quit: 09/08/2010    Years since quitting: 8.2  . Smokeless tobacco: Never Used  Substance and Sexual Activity  . Alcohol use: No  . Drug use: No  . Sexual activity: Not on file  Lifestyle  . Physical activity:    Days per week: Not on file    Minutes per session: Not on file  . Stress: Not on file  Relationships  . Social connections:    Talks on phone: Not on file    Gets together: Not on file    Attends religious  service: Not on file    Active member of club or organization: Not on file    Attends meetings of clubs or organizations: Not on file    Relationship status: Not on file  . Intimate partner violence:    Fear of current or ex partner: Not on file    Emotionally abused: Not on file    Physically abused: Not on file    Forced sexual activity: Not on file  Other Topics Concern  . Not on file  Social History Narrative   Marital status: single; not dating; not interested in 2018     Children: 2 daughters, 1 son passed away 46.45 years old.  Two grandchildren (20,8)      Lives: with daughter, grandson.      Employment:  Education administrator business full time; one employee      Tobacco: quit smoking      Alcohol:  None      Exercise: no formal exercise      ADLs: independent with ADLs; drives; no assistant devices.       Advanced Directives: none in 2018.  FULL CODE; no prolonged measures. HCPOA: daughters     Allergies  Allergen Reactions  . Tramadol   . Codeine Other (See Comments)    Headache     Prior to Admission medications   Medication Sig Start Date End Date Taking? Authorizing Provider  acetaminophen (TYLENOL) 500 MG chewable tablet Chew 1,000 mg by mouth every 6 (six) hours as needed for pain. Reported on 11/28/2015   Yes [provider]  aspirin 81 MG chewable tablet Chew 81 mg by mouth daily.   Yes [provider]  famotidine (PEPCID) 20 MG tablet Take 20 mg by mouth 2 (two) times daily.   Yes [provider]  hydrochlorothiazide (HYDRODIURIL) 25 MG tablet Take 1 tablet (25 mg total) by mouth daily. 08/31/18  Yes Stallings, Zoe A, MD  naproxen (NAPROSYN) 500 MG tablet Take 500 mg by mouth 2 (two) times daily with a meal.   Yes [provider]  OVER THE COUNTER MEDICATION Juice plus   Yes [provider]  simvastatin (ZOCOR) 40 MG tablet Take 1 tablet (40 mg total) by mouth daily with breakfast. 08/31/18  Yes Stallings, Zoe A, MD  ranitidine  (ZANTAC) 75 MG tablet Take 75 mg by mouth 2 (two) times daily.    [provider]     Depression screen [facility] 2/9 08/29/2018 08/12/2017 07/22/2017 12/24/2016 07/06/2016  Decreased Interest 0 0 0 0 0  Down, Depressed, Hopeless 0 0 0 0 0  PHQ - 2  Score 0 0 0 0 0     Fall Risk  11/21/2018 08/29/2018 08/12/2017 07/22/2017 12/24/2016  Falls in the past year? 0 0 No No No  Number falls in past yr: 0 - - - -  Injury with Fall? 0 - - - -      PHYSICAL EXAM: BP 126/71   Ht 5' 6.25" (1.683 m)   Wt 154 lb 4.8 oz (70 kg)   BMI 24.72 kg/m    Wt Readings from Last 3 Encounters:  11/21/18 154 lb 4.8 oz (70 kg)  08/29/18 152 lb 6.4 oz (69.1 kg)  09/07/17 152 lb (68.9 kg)     No exam data present    Physical Exam   Education/Counseling provided regarding diet and exercise, prevention of chronic diseases, smoking/tobacco cessation, if applicable, and reviewed "Covered Medicare Preventive Services."   ASSESSMENT/PLAN: There are no diagnoses linked to this encounter.    Sees Eye doctor [facility]   One story Home lives alone House is free of scattered rugs. No grab bars  Well Lit  Works  Sports coach houses  By herself.    Observed get up 30 seconds from sitting no  Has some pain in hips arthritic when not moving around 6 When moving around and staying active a 1  Exercise no regular program but cleans houses all day.  Breakfast : cereal , coffee , biscuit Lunch:  Fast food  [employer]  a Museum/gallery conservator  Discussed Advanced directives information given   Attestation Statement I was was available for the history and physical exam. I personally discussed this patient at the time of the office visit and agree with the assessment and plan.

## 2018-11-21 NOTE — Addendum Note (Signed)
Addended by: Suszanne Finch on: 11/21/2018 04:51 PM   Modules accepted: Orders

## 2018-11-21 NOTE — Patient Instructions (Addendum)
Thank you for taking time to come for your Medicare Wellness Visit. I appreciate your ongoing commitment to your health goals. Please review the following plan we discussed and let me know if I can assist you in the future.  Lori Kennedy LPN Advance Directive  Advance directives are legal documents that let you make choices ahead of time about your health care and medical treatment in case you become unable to communicate for yourself. Advance directives are a way for you to communicate your wishes to family, friends, and health care providers. This can help convey your decisions about end-of-life care if you become unable to communicate. Discussing and writing advance directives should happen over time rather than all at once. Advance directives can be changed depending on your situation and what you want, even after you have signed the advance directives. If you do not have an advance directive, some states assign family decision makers to act on your behalf based on how closely you are related to them. Each state has its own laws regarding advance directives. You may want to check with your health care provider, attorney, or state representative about the laws in your state. There are different types of advance directives, such as:  Medical power of attorney.  Living will.  Do not resuscitate (DNR) or do not attempt resuscitation (DNAR) order. Health care proxy and medical power of attorney A health care proxy, also called a health care agent, is a person who is appointed to make medical decisions for you in cases in which you are unable to make the decisions yourself. Generally, people choose someone they know well and trust to represent their preferences. Make sure to ask this person for an agreement to act as your proxy. A proxy may have to exercise judgment in the event of a medical decision for which your wishes are not known. A medical power of attorney is a legal document that names your  health care proxy. Depending on the laws in your state, after the document is written, it may also need to be:  Signed.  Notarized.  Dated.  Copied.  Witnessed.  Incorporated into your medical record. You may also want to appoint someone to manage your financial affairs in a situation in which you are unable to do so. This is called a durable power of attorney for finances. It is a separate legal document from the durable power of attorney for health care. You may choose the same person or someone different from your health care proxy to act as your agent in financial matters. If you do not appoint a proxy, or if there is a concern that the proxy is not acting in your best interests, a court-appointed guardian may be designated to act on your behalf. Living will A living will is a set of instructions documenting your wishes about medical care when you cannot express them yourself. Health care providers should keep a copy of your living will in your medical record. You may want to give a copy to family members or friends. To alert caregivers in case of an emergency, you can place a card in your wallet to let them know that you have a living will and where they can find it. A living will is used if you become:  Terminally ill.  Incapacitated.  Unable to communicate or make decisions. Items to consider in your living will include:  The use or non-use of life-sustaining equipment, such as dialysis machines and breathing machines (ventilators).  A DNR or DNAR order, which is the instruction not to use cardiopulmonary resuscitation (CPR) if breathing or heartbeat stops.  The use or non-use of tube feeding.  Withholding of food and fluids.  Comfort (palliative) care when the goal becomes comfort rather than a cure.  Organ and tissue donation. A living will does not give instructions for distributing your money and property if you should pass away. It is recommended that you seek the  advice of a lawyer when writing a will. Decisions about taxes, beneficiaries, and asset distribution will be legally binding. This process can relieve your family and friends of any concerns surrounding disputes or questions that may come up about the distribution of your assets. DNR or DNAR A DNR or DNAR order is a request not to have CPR in the event that your heart stops beating or you stop breathing. If a DNR or DNAR order has not been made and shared, a health care provider will try to help any patient whose heart has stopped or who has stopped breathing. If you plan to have surgery, talk with your health care provider about how your DNR or DNAR order will be followed if problems occur. Summary  Advance directives are the legal documents that allow you to make choices ahead of time about your health care and medical treatment in case you become unable to communicate for yourself.  The process of discussing and writing advance directives should happen over time. You can change the advance directives, even after you have signed them.  Advance directives include DNR or DNAR orders, living wills, and designating an agent as your medical power of attorney. This information is not intended to replace advice given to you by your health care provider. Make sure you discuss any questions you have with your health care provider. Document Released: 12/06/2007 Document Revised: 07/18/2016 Document Reviewed: 07/18/2016 Elsevier Interactive Patient Education  2019 Elsevier Inc.    Healthy Eating Following a healthy eating pattern may help you to achieve and maintain a healthy body weight, reduce the risk of chronic disease, and live a long and productive life. It is important to follow a healthy eating pattern at an appropriate calorie level for your body. Your nutritional needs should be met primarily through food by choosing a variety of nutrient-rich foods. What are tips for following this plan?  Reading food labels  Read labels and choose the following: ? Reduced or low sodium. ? Juices with 100% fruit juice. ? Foods with low saturated fats and high polyunsaturated and monounsaturated fats. ? Foods with whole grains, such as whole wheat, cracked wheat, brown rice, and wild rice. ? Whole grains that are fortified with folic acid. This is recommended for women who are pregnant or who want to become pregnant.  Read labels and avoid the following: ? Foods with a lot of added sugars. These include foods that contain brown sugar, corn sweetener, corn syrup, dextrose, fructose, glucose, high-fructose corn syrup, honey, invert sugar, lactose, malt syrup, maltose, molasses, raw sugar, sucrose, trehalose, or turbinado sugar.  Do not eat more than the following amounts of added sugar per day:  6 teaspoons (25 g) for women.  9 teaspoons (38 g) for men. ? Foods that contain processed or refined starches and grains. ? Refined grain products, such as white flour, degermed cornmeal, white bread, and white rice. Shopping  Choose nutrient-rich snacks, such as vegetables, whole fruits, and nuts. Avoid high-calorie and high-sugar snacks, such as potato chips, fruit snacks, and   Use oil-based dressings and spreads on foods instead of solid fats such as butter, stick margarine, or cream cheese.  Limit pre-made sauces, mixes, and "instant" products such as flavored rice, instant noodles, and ready-made pasta.  Try more plant-protein sources, such as tofu, tempeh, black beans, edamame, lentils, nuts, and seeds.  Explore eating plans such as the Mediterranean diet or vegetarian diet. Cooking  Use oil to saut or stir-fry foods instead of solid fats such as butter, stick margarine, or lard.  Try baking, boiling, grilling, or broiling instead of frying.  Remove the fatty part of meats before cooking.  Steam vegetables in water or broth. Meal planning   At meals, imagine dividing your plate  into fourths: ? One-half of your plate is fruits and vegetables. ? One-fourth of your plate is whole grains. ? One-fourth of your plate is protein, especially lean meats, poultry, eggs, tofu, beans, or nuts.  Include low-fat dairy as part of your daily diet. Lifestyle  Choose healthy options in all settings, including home, work, school, restaurants, or stores.  Prepare your food safely: ? Wash your hands after handling raw meats. ? Keep food preparation surfaces clean by regularly washing with hot, soapy water. ? Keep raw meats separate from ready-to-eat foods, such as fruits and vegetables. ? Cook seafood, meat, poultry, and eggs to the recommended internal temperature. ? Store foods at safe temperatures. In general:  Keep cold foods at 93F (4.4C) or below.  Keep hot foods at 193F (60C) or above.  Keep your freezer at [facility] (-17.8C) or below.  Foods are no longer safe to eat when they have been between the temperatures of 40-193F (4.4-60C) for more than 2 hours. What foods should I eat? Fruits Aim to eat 2 cup-equivalents of fresh, canned (in natural juice), or frozen fruits each day. Examples of 1 cup-equivalent of fruit include 1 small apple, 8 large strawberries, 1 cup canned fruit,  cup dried fruit, or 1 cup 100% juice. Vegetables Aim to eat 2-3 cup-equivalents of fresh and frozen vegetables each day, including different varieties and colors. Examples of 1 cup-equivalent of vegetables include 2 medium carrots, 2 cups raw, leafy greens, 1 cup chopped vegetable (raw or cooked), or 1 medium baked potato. Grains Aim to eat 6 ounce-equivalents of whole grains each day. Examples of 1 ounce-equivalent of grains include 1 slice of bread, 1 cup ready-to-eat cereal, 3 cups popcorn, or  cup cooked rice, pasta, or cereal. Meats and other proteins Aim to eat 5-6 ounce-equivalents of protein each day. Examples of 1 ounce-equivalent of protein include 1 egg, 1/2 cup nuts or seeds, or  1 tablespoon (16 g) peanut butter. A cut of meat or fish that is the size of a deck of cards is about 3-4 ounce-equivalents.  Of the protein you eat each week, try to have at least 8 ounces come from seafood. This includes salmon, trout, herring, and anchovies. Dairy Aim to eat 3 cup-equivalents of fat-free or low-fat dairy each day. Examples of 1 cup-equivalent of dairy include 1 cup (240 mL) milk, 8 ounces (250 g) yogurt, 1 ounces (44 g) natural cheese, or 1 cup (240 mL) fortified soy milk. Fats and oils  Aim for about 5 teaspoons (21 g) per day. Choose monounsaturated fats, such as canola and olive oils, avocados, peanut butter, and most nuts, or polyunsaturated fats, such as sunflower, corn, and soybean oils, walnuts, pine nuts, sesame seeds, sunflower seeds, and flaxseed. Beverages  Aim for six 8-oz glasses of water per  water per day. Limit coffee to three to five 8-oz cups per day.  Limit caffeinated beverages that have added calories, such as soda and energy drinks.  Limit alcohol intake to no more than 1 drink a day for nonpregnant women and 2 drinks a day for men. One drink equals 12 oz of beer (355 mL), 5 oz of wine (148 mL), or 1 oz of hard liquor (44 mL). Seasoning and other foods  Avoid adding excess amounts of salt to your foods. Try flavoring foods with herbs and spices instead of salt.  Avoid adding sugar to foods.  Try using oil-based dressings, sauces, and spreads instead of solid fats. This information is based on general [place] nutrition guidelines. For more information, visit choosemyplate.gov. Exact amounts may vary based on your nutrition needs. Summary  A healthy eating plan may help you to maintain a healthy weight, reduce the risk of chronic diseases, and stay active throughout your life.  Plan your meals. Make sure you eat the right portions of a variety of nutrient-rich foods.  Try baking, boiling, grilling, or broiling instead of frying.  Choose healthy  options in all settings, including home, work, school, restaurants, or stores. This information is not intended to replace advice given to you by your health care provider. Make sure you discuss any questions you have with your health care provider. Document Released: 12/11/2017 Document Revised: 12/11/2017 Document Reviewed: 12/11/2017 Elsevier Interactive Patient Education  2019 Elsevier Inc.  

## 2019-01-22 IMAGING — CT CT CHEST W/O CM
3 of 4 series · 17 of 30 positions shown, 19 images · non-contrast
Comparison: 01/25/2016 CT abdomen/pelvis. 04/24/2015 chest
radiograph.

CLINICAL DATA: Follow-up pulmonary nodules.

EXAM:
CT CHEST WITHOUT CONTRAST
TECHNIQUE: Multidetector CT imaging of the chest was performed following the
standard protocol without IV contrast.

[Series 3: chest w/o · axial · non-contrast · 0.70mm/px · z∈[-236,-31]mm · 6 of 116 slices shown]
[im 17/116  lung]
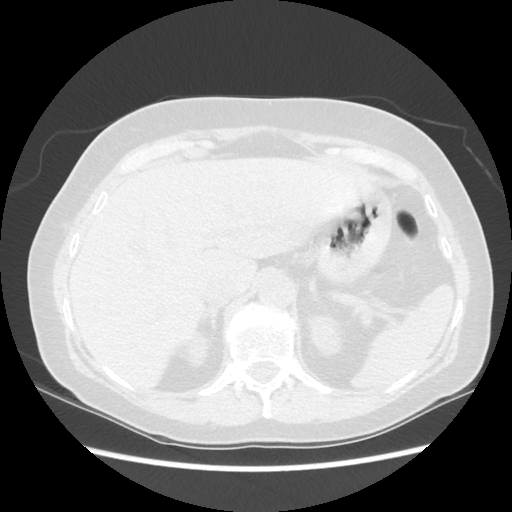
[im 33/116  lung]
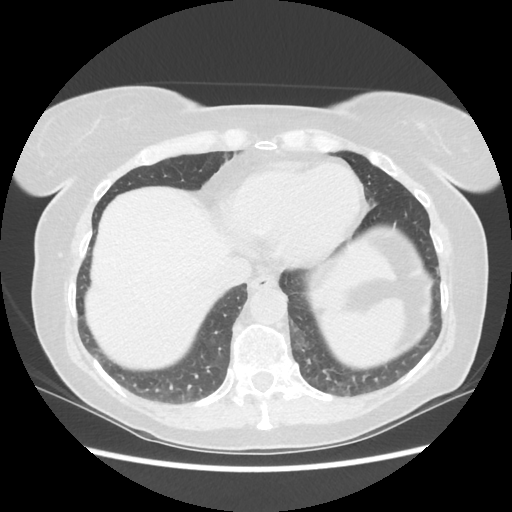
[im 50/116  lung]
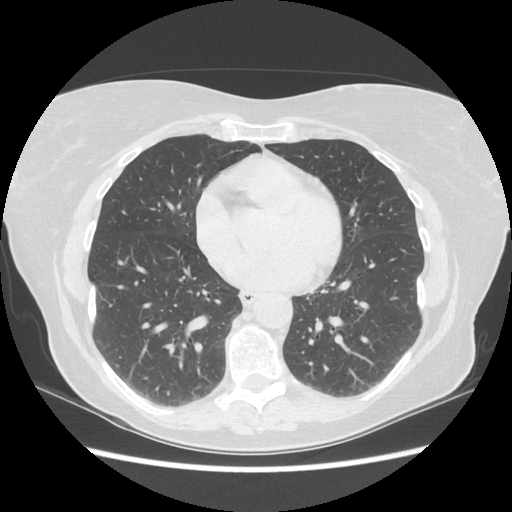
[im 66/116  lung]
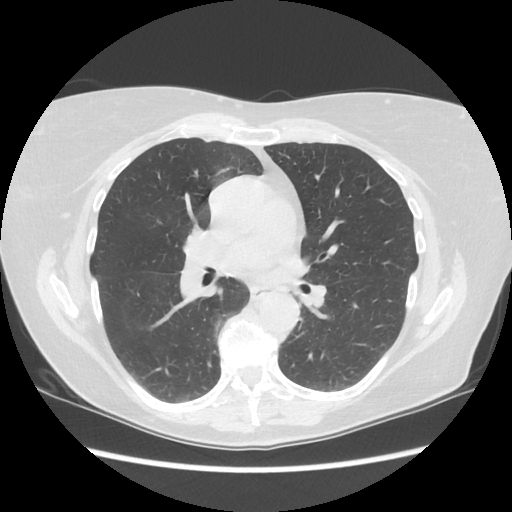
[im 83/116  lung]
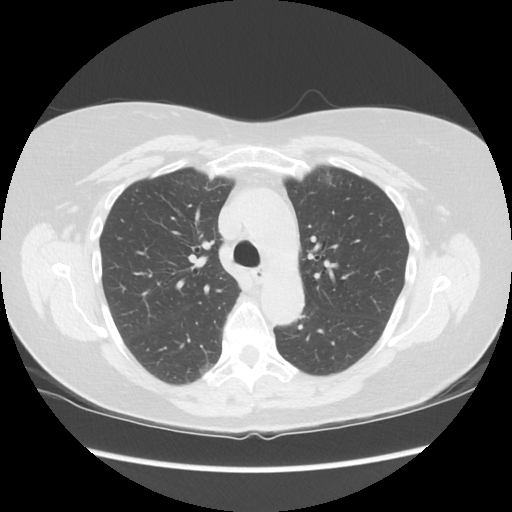
[im 99/116  lung]
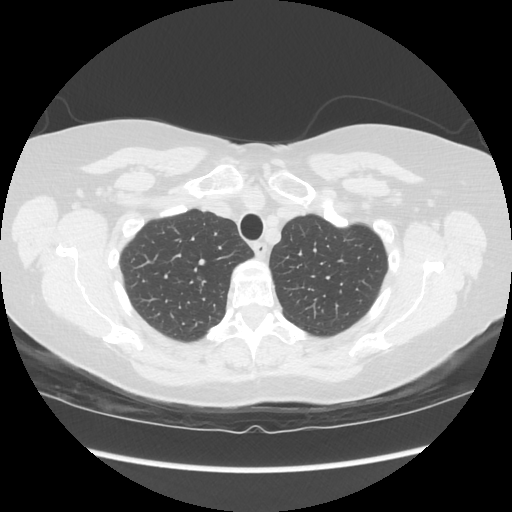

[Series 4: lung windows · axial · 0.70mm/px · z∈[-241,-26]mm · 7 of 116 slices shown, 9 images]
[im 15/116  mediastinal]
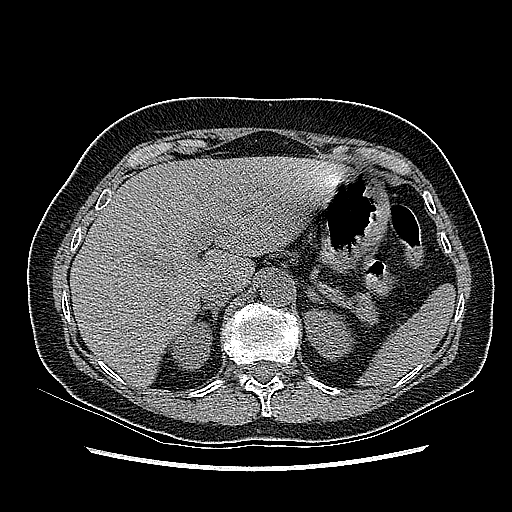
[im 15/116  lung]
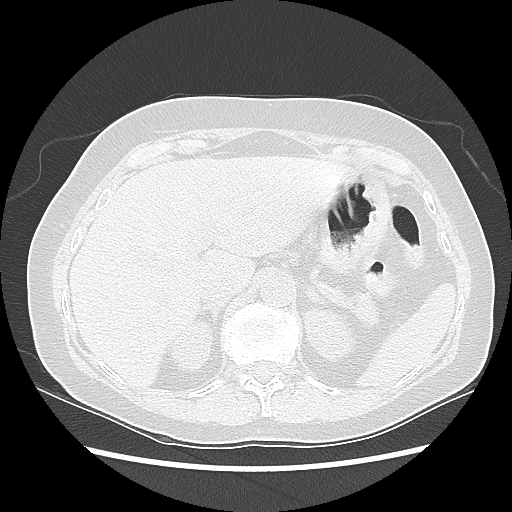
[im 29/116  lung]
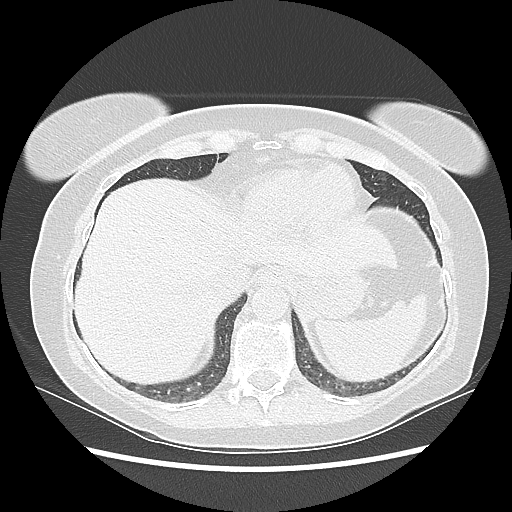
[im 44/116  lung]
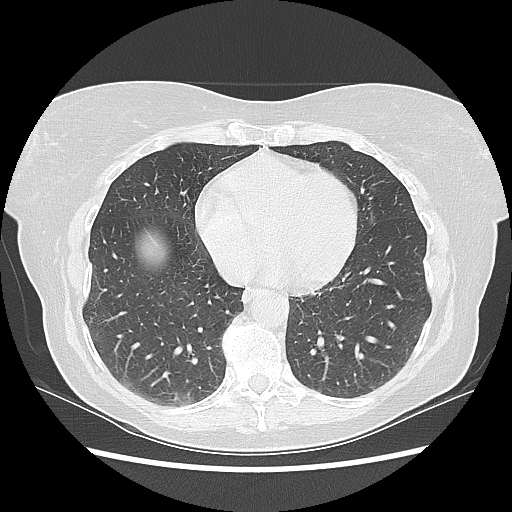
[im 58/116  lung]
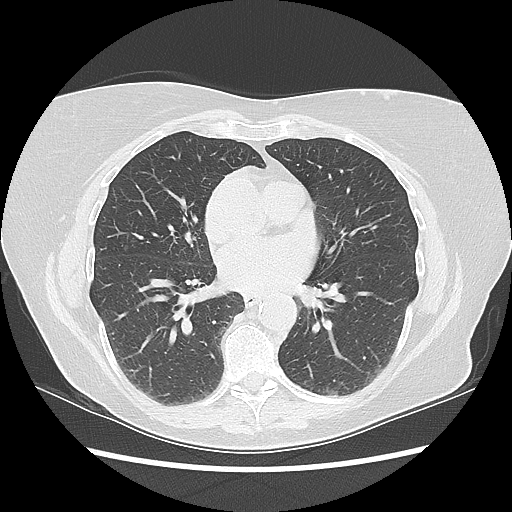
[im 72/116  mediastinal]
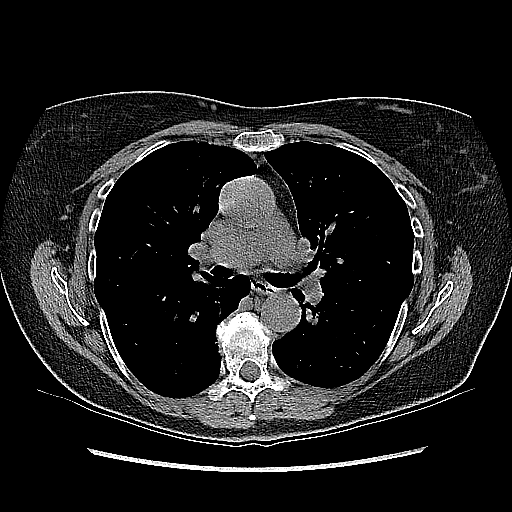
[im 72/116  lung]
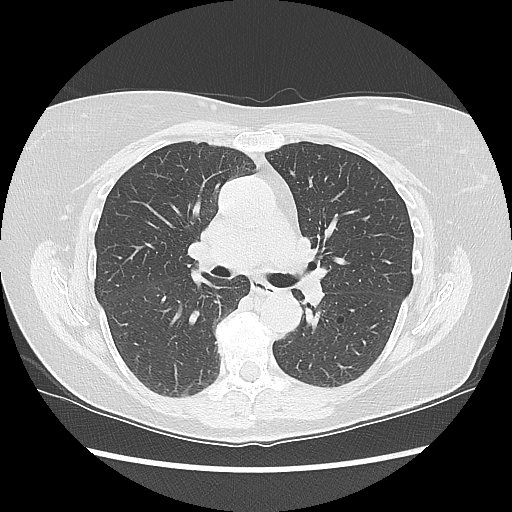
[im 87/116  lung]
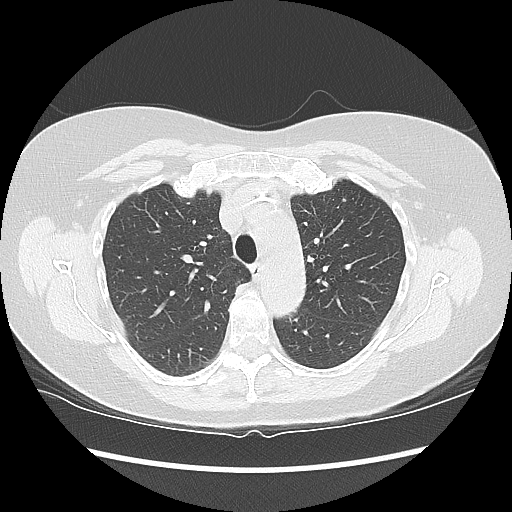
[im 101/116  lung]
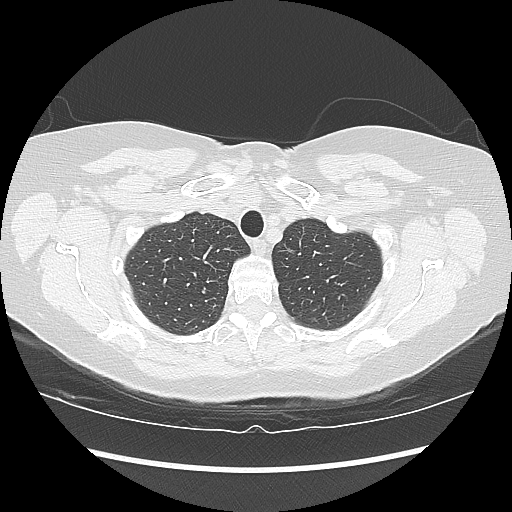

[Series 602: sagittal body · sagittal · 0.70mm/px · 4 of 145 slices shown]
[im 15/145  mediastinal]
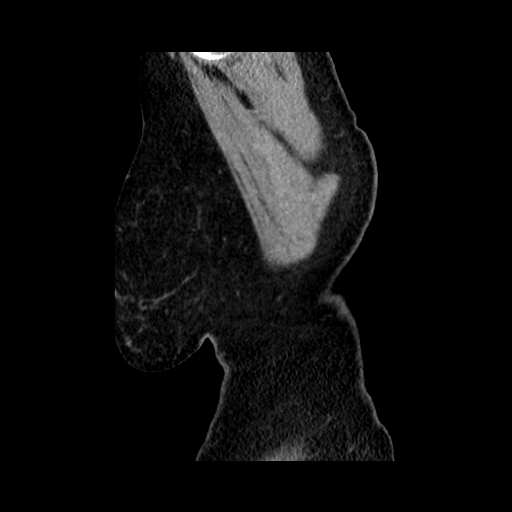
[im 29/145  mediastinal]
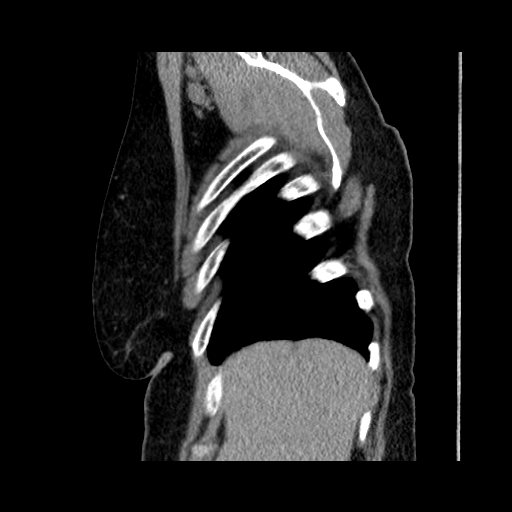
[im 44/145  mediastinal]
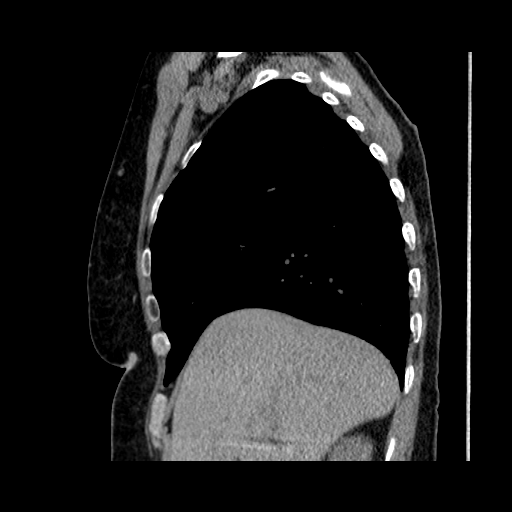
[im 58/145  mediastinal]
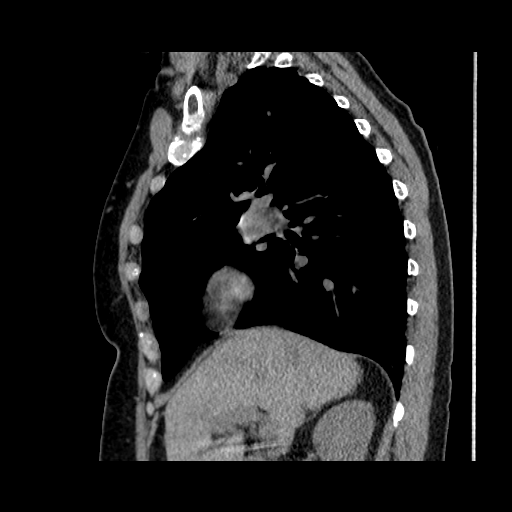

[17 of 30 positions shown; findings below may reference images not displayed]

FINDINGS: Cardiovascular: Normal heart size. No significant pericardial
fluid/thickening. Mildly atherosclerotic nonaneurysmal thoracic
aorta. Normal caliber pulmonary arteries.

Mediastinum/Nodes: No discrete thyroid nodules. Unremarkable
esophagus. No pathologically enlarged axillary, mediastinal or gross
hilar lymph nodes, noting limited sensitivity for the detection of
hilar adenopathy on this noncontrast study.

Lungs/Pleura: No pneumothorax. No pleural effusion. There are a few
scattered solid pulmonary nodules in the lower lobes bilaterally,
largest 6 mm in the dependent basilar right lower lobe (series
4/image 70), all unchanged since 01/25/2016 CT abdomen study using
similar measurement technique. No acute consolidative airspace
disease, lung masses or new significant pulmonary nodules.

Upper abdomen: Cholecystectomy.

Musculoskeletal: No aggressive appearing focal osseous lesions.
Moderate thoracic spondylosis.
IMPRESSION: 1. Stable scattered small solid pulmonary nodules in the bilateral
lower lobes, for which 19 month stability has been demonstrated,
considered benign.
2. No active disease in the chest.

Aortic Atherosclerosis ([id number]-G55.5).

## 2019-03-09 DIAGNOSIS — Z1231 Encounter for screening mammogram for malignant neoplasm of breast: Secondary | ICD-10-CM | POA: Diagnosis not present

## 2019-03-09 LAB — HM MAMMOGRAPHY

## 2019-04-04 ENCOUNTER — Ambulatory Visit: Payer: Self-pay

## 2019-04-04 NOTE — Telephone Encounter (Signed)
Incoming call from Patient with Covid-19 questions Reviewed protocol, Provided care advice.  Voiced understanding.       Reason for Disposition . [1] COVID-19 EXPOSURE (Close Contact) within last 14 days AND [2] needs COVID-19 lab test to return to work AND [3] NO symptoms  Answer Assessment - Initial Assessment Questions 1. COVID-19 DIAGNOSIS: "Who made your Coronavirus (COVID-19) diagnosis?" "Was it confirmed by a positive lab test?" If not diagnosed by a HCP, ask "Are there lots of cases (community spread) where you live?" (See public health department website, if unsure)    2. ONSET: "When did the COVID-19 symptoms start?"      Acid reflux 3. WORST SYMPTOM: "What is your worst symptom?" (e.g., cough, fever, shortness of breath, muscle aches)     denies 4. COUGH: "Do you have a cough?" If so, ask: "How bad is the cough?"       denies 5. FEVER: "Do you have a fever?" If so, ask: "What is your temperature, how was it measured, and when did it start?"     deniesSPIRATORY STATUS: "Describe your breathing?" (e.g., shortness of breath, wheezing, unable to speak)      denies 7. BETTER-SAME-WORSE: "Are you getting better, staying the same or getting worse compared to yesterday?"  If getting worse, ask, "In what way?"     same 8. HIGH RISK DISEASE: "Do you have any chronic medical problems?" (e.g., asthma, heart or lung disease, weak immune system, etc.)    hypertension 9. PREGNANCY: "Is there any chance you are pregnant?" "When was your last menstrual period?"     na 10. OTHER SYMPTOMS: "Do you have any other symptoms?"  (e.g., chills, fatigue, headache, loss of smell or taste, muscle pain, sore throat)      Denies  Protocols used: CORONAVIRUS (COVID-19) EXPOSURE-A-AH, CORONAVIRUS (COVID-19) DIAGNOSED OR SUSPECTED-A-AH

## 2019-04-09 NOTE — Progress Notes (Signed)
Impression : Negative There is no mammographic evidence of malignancy.  Routine mammographic evaluation in 1 yr is recommended (03/09/2020).

## 2019-07-16 DIAGNOSIS — Z7189 Other specified counseling: Secondary | ICD-10-CM | POA: Diagnosis not present

## 2019-07-16 DIAGNOSIS — Z20828 Contact with and (suspected) exposure to other viral communicable diseases: Secondary | ICD-10-CM | POA: Diagnosis not present

## 2019-07-26 ENCOUNTER — Other Ambulatory Visit: Payer: Self-pay

## 2019-07-26 DIAGNOSIS — Z20822 Contact with and (suspected) exposure to covid-19: Secondary | ICD-10-CM

## 2019-07-28 LAB — NOVEL CORONAVIRUS, NAA: SARS-CoV-2, NAA: NOT DETECTED

## 2019-07-29 ENCOUNTER — Telehealth: Payer: Self-pay | Admitting: General Practice

## 2019-07-29 NOTE — Telephone Encounter (Signed)
Negative COVID results given. Patient results "NOT Detected." Caller expressed understanding. ° °

## 2019-08-02 ENCOUNTER — Other Ambulatory Visit: Payer: Self-pay

## 2019-08-02 DIAGNOSIS — Z20822 Contact with and (suspected) exposure to covid-19: Secondary | ICD-10-CM

## 2019-08-05 LAB — NOVEL CORONAVIRUS, NAA: SARS-CoV-2, NAA: NOT DETECTED

## 2019-08-21 DIAGNOSIS — L820 Inflamed seborrheic keratosis: Secondary | ICD-10-CM | POA: Diagnosis not present

## 2019-08-21 DIAGNOSIS — L821 Other seborrheic keratosis: Secondary | ICD-10-CM | POA: Diagnosis not present

## 2019-08-21 DIAGNOSIS — L920 Granuloma annulare: Secondary | ICD-10-CM | POA: Diagnosis not present

## 2019-08-28 ENCOUNTER — Other Ambulatory Visit: Payer: Self-pay | Admitting: Family Medicine

## 2019-08-28 DIAGNOSIS — E785 Hyperlipidemia, unspecified: Secondary | ICD-10-CM

## 2019-08-28 NOTE — Telephone Encounter (Signed)
Review for refill for simvastatin 40 mg tab. Prescription expires on 08/31/2019

## 2019-09-03 ENCOUNTER — Telehealth: Payer: Self-pay | Admitting: Family Medicine

## 2019-09-03 ENCOUNTER — Telehealth (INDEPENDENT_AMBULATORY_CARE_PROVIDER_SITE_OTHER): Payer: PPO | Admitting: Registered Nurse

## 2019-09-03 ENCOUNTER — Other Ambulatory Visit: Payer: Self-pay

## 2019-09-03 DIAGNOSIS — H60391 Other infective otitis externa, right ear: Secondary | ICD-10-CM | POA: Diagnosis not present

## 2019-09-03 DIAGNOSIS — H60501 Unspecified acute noninfective otitis externa, right ear: Secondary | ICD-10-CM

## 2019-09-03 MED ORDER — CIPROFLOXACIN-FLUOCINOLONE PF 0.3-0.025 % OT SOLN: 0.2500 mL | Freq: Two times a day (BID) | OTIC | 1 refills | Status: DC

## 2019-09-03 MED ORDER — HYDROCORTISONE-ACETIC ACID 1-2 % OT SOLN: 3.0000 [drp] | Freq: Three times a day (TID) | OTIC | 0 refills | Status: DC

## 2019-09-03 NOTE — Progress Notes (Signed)
Telemedicine Encounter- SOAP NOTE Established Patient  This telephone encounter was conducted with the patient's (or proxy's) verbal consent via audio telecommunications: yes   Patient was instructed to have this encounter in a suitably private space; and to only have persons present to whom they give permission to participate. In addition, patient identity was confirmed by use of name plus two identifiers (DOB and address).  I discussed the limitations, risks, security and privacy concerns of performing an evaluation and management service by telephone and the availability of in person appointments. I also discussed with the patient that there may be a patient responsible charge related to this service. The patient expressed understanding and agreed to proceed.  I spent a total of 12 minutes talking with the patient or their proxy.  No chief complaint on file.   Subjective   Lori Cook is a 68 y.o. established patient. Telephone visit today for ear pain R side  HPI Onset 3 weeks ago R ear canal/outer ear Constant Aching pain Has tried acetic acid/hydrocortisone drops - intermittent relief Has happened before, in March. Treated with acetic acid/hc drops, effective at that time. Unsure if drainage or just drops leaking out No recent URI No blood from ear No changes to hearing quality   Patient Active Problem List   Diagnosis Date Noted  . Calculus of gallbladder without cholecystitis without obstruction 05/07/2016  . Nephrolithiasis 05/07/2016  . Pulmonary nodules 05/07/2016  . Hematuria, microscopic 05/07/2016  . Essential hypertension, benign 04/24/2015  . Allergic rhinitis due to pollen 04/24/2015  . Retinal vein occlusion, branch 04/24/2015  . Retinal vein occlusion 03/08/2015  . Essential hypertension-new onset 03/08/2015  . Hyperlipidemia with target LDL less than 130 09/09/2012    Past Medical History:  Diagnosis Date  . Allergy   . Arthritis   . Blood  transfusion without reported diagnosis   . Cholelithiasis    large stone detected by CT 01/2016  . GERD (gastroesophageal reflux disease)   . Hyperlipidemia   . Hypertension   . Nephrolithiasis    36mm kidney stone L by CT 01/2016  . 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 acetaminophen (TYLENOL) 500 MG chewable tablet Chew 1,000 mg by mouth every 6 (six) hours as needed for pain. Reported on 11/28/2015    . acetic acid-hydrocortisone (VOSOL-HC) OTIC solution Place 3 drops into the right ear 3 (three) times daily. 10 mL 0  . aspirin 81 MG chewable tablet Chew 81 mg by mouth daily.    . famotidine (PEPCID) 20 MG tablet Take 20 mg by mouth 2 (two) times daily.    . hydrochlorothiazide (HYDRODIURIL) 25 MG tablet Take 1 tablet (25 mg total) by mouth daily. 90 tablet 3  . naproxen (NAPROSYN) 500 MG tablet Take 500 mg by mouth 2 (two) times daily with a meal.    . OVER THE COUNTER MEDICATION Juice plus    . ranitidine (ZANTAC) 75 MG tablet Take 75 mg by mouth 2 (two) times daily.    . simvastatin (ZOCOR) 40 MG tablet Take 1 tablet by mouth once daily with breakfast 30 tablet 0  . ciprofloxacin-fluocinolone PF (OTOVEL) 0.3-0.025 % SOLN Place 0.25 mLs into the right ear 2 (two) times daily. 14 each 1   Current Facility-Administered Medications  Medication Dose Route Frequency Provider Last Rate Last Admin  . 0.9 %  sodium chloride infusion  500 mL Intravenous Once Armbruster, Carlota Raspberry, MD        Allergies  Allergen Reactions  . Tramadol   . Codeine Other (See Comments)    Headache    Social History   Socioeconomic History  . Marital status: Single    Spouse name: Not on file  . Number of children: Not on file  . Years of education: Not on file  . Highest education level: Not on file  Occupational History  . Not on file  Tobacco Use  . Smoking status: Former Smoker    Quit  date: 09/08/2010    Years since quitting: 8.9  . Smokeless tobacco: Never Used  Substance and Sexual Activity  . Alcohol use: No  . Drug use: No  . Sexual activity: Not on file  Other Topics Concern  . Not on file  Social History Narrative   Marital status: single; not dating; not interested in 2018     Children: 2 daughters, 1 son passed away 44.22 years old.  Two grandchildren (20,8)      Lives: with daughter, grandson.      Employment:  Education administrator business full time; one employee      Tobacco: quit smoking      Alcohol:  None      Exercise: no formal exercise      ADLs: independent with ADLs; drives; no assistant devices.       Advanced Directives: none in 2018.  FULL CODE; no prolonged measures. HCPOA: daughters   Social Determinants of Health   Financial Resource Strain:   . Difficulty of Paying Living Expenses: Not on file  Food Insecurity:   . Worried About Charity fundraiser in the Last Year: Not on file  . Ran Out of Food in the Last Year: Not on file  Transportation Needs:   . Lack of Transportation (Medical): Not on file  . Lack of Transportation (Non-Medical): Not on file  Physical Activity:   . Days of Exercise per Week: Not on file  . Minutes of Exercise per Session: Not on file  Stress:   . Feeling of Stress : Not on file  Social Connections:   . Frequency of Communication with Friends and Family: Not on file  . Frequency of Social Gatherings with Friends and Family: Not on file  . Attends Religious Services: Not on file  . Active Member of Clubs or Organizations: Not on file  . Attends Archivist Meetings: Not on file  . Marital Status: Not on file  Intimate Partner Violence:   . Fear of Current or Ex-Partner: Not on file  . Emotionally Abused: Not on file  . Physically Abused: Not on file  . Sexually Abused: Not on file    Review of Systems  Constitutional: Negative.  Negative for chills, fever and malaise/fatigue.  HENT: Positive for ear  discharge and ear pain. Negative for congestion, hearing loss, nosebleeds, sinus pain, sore throat and tinnitus.   Eyes: Negative.   Respiratory: Negative.  Negative for stridor.   Cardiovascular: Negative.   Gastrointestinal: Negative.   Genitourinary: Negative.   Musculoskeletal: Negative.   Skin: Negative.   Neurological: Negative.   Endo/Heme/Allergies: Negative.   Psychiatric/Behavioral: Negative.     Objective   Vitals as reported by the patient: There were no vitals filed for this visit.  Diagnoses and all orders for this visit:  Infective otitis externa of right ear -     ciprofloxacin-fluocinolone  PF (OTOVEL) 0.3-0.025 % SOLN; Place 0.25 mLs into the right ear 2 (two) times daily.  Acute otitis externa of right ear, unspecified type -     ciprofloxacin-fluocinolone PF (OTOVEL) 0.3-0.025 % SOLN; Place 0.25 mLs into the right ear 2 (two) times daily. -     acetic acid-hydrocortisone (VOSOL-HC) OTIC solution; Place 3 drops into the right ear 3 (three) times daily.   PLAN  Presumed infectious otitis externa. Has tried treatment protocol for mild disease.  Will give cipro fluocinolone otic drops, 3 drops in ear twice daily  Return for in clinic appt if symptoms worsen or fail to improve  Patient encouraged to call clinic with any questions, comments, or concerns.   I discussed the assessment and treatment plan with the patient. The patient was provided an opportunity to ask questions and all were answered. The patient agreed with the plan and demonstrated an understanding of the instructions.   The patient was advised to call back or seek an in-person evaluation if the symptoms worsen or if the condition fails to improve as anticipated.  I provided 12 minutes of non-face-to-face time during this encounter.  Maximiano Coss, NP  Primary Care at [facility]

## 2019-09-03 NOTE — Telephone Encounter (Signed)
Lori Cook called to let us know that she  missed her call pt request that we call her between 11:30 and 12:30 to triage her FR

## 2019-09-03 NOTE — Progress Notes (Signed)
Chief complaint- ear ache , drops are not working. Right ear been hurting off/on for 3 weeks. Was using old ear drops for ear ache.

## 2019-10-03 ENCOUNTER — Telehealth: Payer: Self-pay | Admitting: Family Medicine

## 2019-10-03 ENCOUNTER — Other Ambulatory Visit: Payer: Self-pay | Admitting: Family Medicine

## 2019-10-03 ENCOUNTER — Other Ambulatory Visit: Payer: Self-pay | Admitting: Emergency Medicine

## 2019-10-03 DIAGNOSIS — E785 Hyperlipidemia, unspecified: Secondary | ICD-10-CM

## 2019-10-03 DIAGNOSIS — I10 Essential (primary) hypertension: Secondary | ICD-10-CM

## 2019-10-03 MED ORDER — HYDROCHLOROTHIAZIDE 25 MG PO TABS: 25.0000 mg | ORAL_TABLET | Freq: Every day | ORAL | 0 refills | Status: DC

## 2019-10-03 MED ORDER — SIMVASTATIN 40 MG PO TABS: ORAL_TABLET | ORAL | 0 refills | Status: DC

## 2019-10-03 NOTE — Telephone Encounter (Signed)
Rx has been sent for 30 days

## 2019-10-03 NOTE — Telephone Encounter (Signed)
Pt has scheduled her med refill appointment and only has enough for 8 days . She is requesting a temporary refill if possible    Please advise      simvastatin (ZOCOR) 40 MG tablet [id number]    hydrochlorothiazide (HYDRODIURIL) 25 MG tablet [id number]    [address]  [place], [place] [address]

## 2019-10-23 ENCOUNTER — Ambulatory Visit (INDEPENDENT_AMBULATORY_CARE_PROVIDER_SITE_OTHER): Payer: PPO | Admitting: Family Medicine

## 2019-10-23 ENCOUNTER — Encounter: Payer: Self-pay | Admitting: Family Medicine

## 2019-10-23 ENCOUNTER — Other Ambulatory Visit: Payer: Self-pay

## 2019-10-23 VITALS — BP 133/68 | HR 73 | Temp 98.2°F | Ht 66.0 in | Wt 150.6 lb

## 2019-10-23 DIAGNOSIS — N3281 Overactive bladder: Secondary | ICD-10-CM

## 2019-10-23 DIAGNOSIS — M85851 Other specified disorders of bone density and structure, right thigh: Secondary | ICD-10-CM | POA: Diagnosis not present

## 2019-10-23 DIAGNOSIS — M160 Bilateral primary osteoarthritis of hip: Secondary | ICD-10-CM

## 2019-10-23 DIAGNOSIS — I10 Essential (primary) hypertension: Secondary | ICD-10-CM

## 2019-10-23 DIAGNOSIS — E785 Hyperlipidemia, unspecified: Secondary | ICD-10-CM

## 2019-10-23 MED ORDER — SIMVASTATIN 40 MG PO TABS: ORAL_TABLET | ORAL | 3 refills | Status: DC

## 2019-10-23 MED ORDER — AMLODIPINE BESYLATE 5 MG PO TABS: 5.0000 mg | ORAL_TABLET | Freq: Every day | ORAL | 3 refills | Status: DC

## 2019-10-23 NOTE — Patient Instructions (Signed)
° ° ° °  If you have lab work done today you will be contacted with your lab results within the next 2 weeks.  If you have not heard from us then please contact us. The fastest way to get your results is to register for My Chart. ° ° °IF you received an x-ray today, you will receive an invoice from [facility]. Please contact [facility] at [phone] with questions or concerns regarding your invoice.  ° °IF you received labwork today, you will receive an invoice from [facility]. Please contact [facility] at [phone] with questions or concerns regarding your invoice.  ° °Our billing staff will not be able to assist you with questions regarding bills from these companies. ° °You will be contacted with the lab results as soon as they are available. The fastest way to get your results is to activate your My Chart account. Instructions are located on the last page of this paperwork. If you have not heard from us regarding the results in 2 weeks, please contact this office. °  ° ° ° °

## 2019-10-23 NOTE — Progress Notes (Signed)
Established Patient Office Visit  Subjective:  Patient ID: Lori Cook, female    DOB: 09/27/1950  Age: 69 y.o. MRN: [id number]  CC:  Chief Complaint  Patient presents with  . Medication Refill    BP and lipids    HPI Lori Cook presents for   Hypertension: Patient here for follow-up of elevated blood pressure. She is exercising and is adherent to low salt diet.  Blood pressure is well controlled at home. Cardiac symptoms none. Patient denies chest pain, claudication, dyspnea, fatigue, irregular heart beat and lower extremity edema.  Cardiovascular risk factors: advanced age (older than 40 for men, 39 for women), dyslipidemia and hypertension. Use of agents associated with hypertension: none. History of target organ damage: none.   BP Readings from Last 3 Encounters:  10/23/19 133/68  11/21/18 126/71  08/29/18 138/77    Dyslipidemia: Patient presents for evaluation of lipids.  Compliance with treatment thus far has been good.  A repeat fasting lipid profile was done.  The patient does not use medications that may worsen dyslipidemias (corticosteroids, progestins, anabolic steroids, diuretics, beta-blockers, amiodarone, cyclosporine, olanzapine). The patient exercises rarely.  The patient is not known to have coexisting coronary artery disease.   The 10-year ASCVD risk score Mikey Bussing DC Brooke Bonito., et al., 2013) is: 9.2%   Values used to calculate the score:     Age: 47 years     Sex: Female     Is Non-Hispanic African American: No     Diabetic: No     Tobacco smoker: No     Systolic Blood Pressure: 056 mmHg     Is BP treated: Yes     HDL Cholesterol: 50 mg/dL     Total Cholesterol: 157 mg/dL  Urinary Symptom She is taking the HCTZ at bedtime and wakes up 3 times a night to urinate.  She reports that her bladder was tacked up in 1999 and states that she thinks it slipped down. She states that she might even wet herself  Hip pain Patient reports that she has 9/10 hip  pain. +hip pain with difficulty getting in and out of the car or finding a comfortable spot to lie down and has to sleep in a recliner. She has a history of osteopenia She denies falls and fractures She has bone pains and history of kidney stones   Past Medical History:  Diagnosis Date  . Allergy   . Arthritis   . Blood transfusion without reported diagnosis   . Cholelithiasis    large stone detected by CT 01/2016  . GERD (gastroesophageal reflux disease)   . Hyperlipidemia   . Hypertension   . Nephrolithiasis    21m kidney stone L by CT 01/2016  . 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 Stroke ([facility]    in retina only     Past Surgical History:  Procedure Laterality Date  . ABDOMINAL HYSTERECTOMY    . APPENDECTOMY    . CHOLECYSTECTOMY    . COLONOSCOPY     [facility]- records purges and destroyed- per pt Normal   . SPINE SURGERY      Family History  Problem Relation Age of Onset  . Stroke Father   . Diabetes Father   . Heart disease Father 543      AMI  . Hypertension Sister   . Arthritis Brother   . Mental illness Mother   . Heart disease Paternal Uncle   . Colon cancer Neg Hx   .  Colon polyps Neg Hx   . Esophageal cancer Neg Hx   . Rectal cancer Neg Hx   . Stomach cancer Neg Hx     Social History   Socioeconomic History  . Marital status: Single    Spouse name: Not on file  . Number of children: Not on file  . Years of education: Not on file  . Highest education level: Not on file  Occupational History  . Not on file  Tobacco Use  . Smoking status: Former Smoker    Quit date: 09/08/2010    Years since quitting: 9.1  . Smokeless tobacco: Never Used  Substance and Sexual Activity  . Alcohol use: No  . Drug use: No  . Sexual activity: Not on file  Other Topics Concern  . Not on file  Social History Narrative   Marital status: single; not dating; not interested in 2018     Children: 2 daughters, 1 son passed away 17.44 years old.  Two  grandchildren (20,8)      Lives: with daughter, grandson.      Employment:  Education administrator business full time; one employee      Tobacco: quit smoking      Alcohol:  None      Exercise: no formal exercise      ADLs: independent with ADLs; drives; no assistant devices.       Advanced Directives: none in 2018.  FULL CODE; no prolonged measures. HCPOA: daughters   Social Determinants of Health   Financial Resource Strain:   . Difficulty of Paying Living Expenses: Not on file  Food Insecurity:   . Worried About Charity fundraiser in the Last Year: Not on file  . Ran Out of Food in the Last Year: Not on file  Transportation Needs:   . Lack of Transportation (Medical): Not on file  . Lack of Transportation (Non-Medical): Not on file  Physical Activity:   . Days of Exercise per Week: Not on file  . Minutes of Exercise per Session: Not on file  Stress:   . Feeling of Stress : Not on file  Social Connections:   . Frequency of Communication with Friends and Family: Not on file  . Frequency of Social Gatherings with Friends and Family: Not on file  . Attends Religious Services: Not on file  . Active Member of Clubs or Organizations: Not on file  . Attends Archivist Meetings: Not on file  . Marital Status: Not on file  Intimate Partner Violence:   . Fear of Current or Ex-Partner: Not on file  . Emotionally Abused: Not on file  . Physically Abused: Not on file  . Sexually Abused: Not on file    Outpatient Medications Prior to Visit  Medication Sig Dispense Refill  . acetaminophen (TYLENOL) 500 MG chewable tablet Chew 1,000 mg by mouth every 6 (six) hours as needed for pain. Reported on 11/28/2015    . aspirin 81 MG chewable tablet Chew 81 mg by mouth daily.    . ciprofloxacin-fluocinolone PF (OTOVEL) 0.3-0.025 % SOLN Place 0.25 mLs into the right ear 2 (two) times daily. 14 each 1  . famotidine (PEPCID) 20 MG tablet Take 20 mg by mouth 2 (two) times daily.    . naproxen (NAPROSYN)  500 MG tablet Take 500 mg by mouth 2 (two) times daily with a meal.    . OVER THE COUNTER MEDICATION Juice plus    . ranitidine (ZANTAC) 75 MG tablet Take 75  mg by mouth 2 (two) times daily.    . hydrochlorothiazide (HYDRODIURIL) 25 MG tablet Take 1 tablet (25 mg total) by mouth daily. 30 tablet 0  . simvastatin (ZOCOR) 40 MG tablet Take 1 tablet by mouth once daily with breakfast 30 tablet 0  . acetic acid-hydrocortisone (VOSOL-HC) OTIC solution Place 3 drops into the right ear 3 (three) times daily. (Patient not taking: Reported on 10/23/2019) 10 mL 0   Facility-Administered Medications Prior to Visit  Medication Dose Route Frequency Provider Last Rate Last Admin  . 0.9 %  sodium chloride infusion  500 mL Intravenous Once Armbruster, Carlota Raspberry, MD        Allergies  Allergen Reactions  . Tramadol   . Codeine Other (See Comments)    Headache    ROS Review of Systems Review of Systems  Constitutional: Negative for activity change, appetite change, chills and fever.  HENT: Negative for congestion, nosebleeds, trouble swallowing and voice change.   Respiratory: Negative for cough, shortness of breath and wheezing.   Gastrointestinal: Negative for diarrhea, nausea and vomiting.  Genitourinary: see hpi Musculoskeletal:  Neurological: Negative for dizziness, speech difficulty, light-headedness and numbness.  See HPI. All other review of systems negative.     Objective:    Physical Exam  BP 133/68   Pulse 73   Temp 98.2 F (36.8 C) (Temporal)   Ht '5\' 6"'  (1.676 m)   Wt 150 lb 9.6 oz (68.3 kg)   SpO2 98%   BMI 24.31 kg/m  Wt Readings from Last 3 Encounters:  10/23/19 150 lb 9.6 oz (68.3 kg)  11/21/18 154 lb 4.8 oz (70 kg)  08/29/18 152 lb 6.4 oz (69.1 kg)   Physical Exam  Constitutional: Oriented to person, place, and time. Appears well-developed and well-nourished.  HENT:  Head: Normocephalic and atraumatic.  Eyes: Conjunctivae and EOM are normal.  Cardiovascular: Normal  rate, regular rhythm, normal heart sounds and intact distal pulses.  No murmur heard. Pulmonary/Chest: Effort normal and breath sounds normal. No stridor. No respiratory distress. Has no wheezes.  Neurological: Is alert and oriented to person, place, and time.  Skin: Skin is warm. Capillary refill takes less than 2 seconds.  Psychiatric: Has a normal mood and affect. Behavior is normal. Judgment and thought content normal.   Hip exam Tenderness over the hip with external rotation No SI joint pain Noted to also have hamstring tightness No pain with flexion of leg  There are no preventive care reminders to display for this patient.  There are no preventive care reminders to display for this patient.  Lab Results  Component Value Date   TSH 2.120 08/29/2018   Lab Results  Component Value Date   WBC 8.1 08/12/2017   HGB 13.3 08/12/2017   HCT 40.7 08/12/2017   MCV 91 08/12/2017   PLT 319 08/12/2017   Lab Results  Component Value Date   NA 145 (H) 10/23/2019   K 3.4 (L) 10/23/2019   CO2 26 10/23/2019   GLUCOSE 124 (H) 10/23/2019   BUN 24 10/23/2019   CREATININE 0.99 10/23/2019   BILITOT 0.3 10/23/2019   ALKPHOS 92 10/23/2019   AST 17 10/23/2019   ALT 11 10/23/2019   PROT 6.8 10/23/2019   ALBUMIN 4.6 10/23/2019   CALCIUM 10.8 (H) 10/23/2019   Lab Results  Component Value Date   CHOL 157 10/23/2019   Lab Results  Component Value Date   HDL 50 10/23/2019   Lab Results  Component Value Date  LDLCALC 77 10/23/2019   Lab Results  Component Value Date   TRIG 180 (H) 10/23/2019   Lab Results  Component Value Date   CHOLHDL 3.1 10/23/2019   Lab Results  Component Value Date   HGBA1C 5.6 08/12/2017      Assessment & Plan:   Problem List Items Addressed This Visit      Cardiovascular and Mediastinum   Essential hypertension, benign - Primary Patient's blood pressure is at goal of 139/89 or less. Condition is stable. Continue current medications and  treatment plan. I recommend that you exercise for 30-45 minutes 5 days a week. I also recommend a balanced diet with fruits and vegetables every day, lean meats, and little fried foods. The DASH diet (you can find this online) is a good example of this.    Relevant Medications   amLODipine (NORVASC) 5 MG tablet   simvastatin (ZOCOR) 40 MG tablet   Other Relevant Orders   CMP14+EGFR (Completed)   Lipid panel (Completed)     Other   Hyperlipidemia with target LDL less than 130 - levels of her HDL are good Will recheck    Relevant Medications   amLODipine (NORVASC) 5 MG tablet   simvastatin (ZOCOR) 40 MG tablet   Other Relevant Orders   CMP14+EGFR (Completed)   Lipid panel (Completed)    Other Visit Diagnoses    Bilateral hip joint arthritis       Relevant Orders   Ambulatory referral to Orthopedic Surgery   Osteopenia of neck of right femur    -  Discussed orthopedics and advised evaluation    Relevant Orders   Ambulatory referral to Orthopedic Surgery   TSH   PTH, intact and calcium   Overactive bladder    -  Offered Urology follow up but since pt had surgery with Gyne at the time of her hysterectomy it would be best to follow up   Relevant Orders   Ambulatory referral to Obstetrics / Gynecology   Hyperlipidemia, unspecified hyperlipidemia type       Relevant Medications   amLODipine (NORVASC) 5 MG tablet   simvastatin (ZOCOR) 40 MG tablet   Hypercalcemia    -  Patient with hip pain, history of kidney stones and also hypercalcemia will check PTH   Relevant Orders   TSH   PTH, intact and calcium      Meds ordered this encounter  Medications  . amLODipine (NORVASC) 5 MG tablet    Sig: Take 1 tablet (5 mg total) by mouth daily.    Dispense:  90 tablet    Refill:  3  . simvastatin (ZOCOR) 40 MG tablet    Sig: Take 1 tablet by mouth once daily with breakfast    Dispense:  90 tablet    Refill:  3    Follow-up: No follow-ups on file.    Forrest Moron, MD

## 2019-10-24 LAB — LIPID PANEL
Chol/HDL Ratio: 3.1 ratio (ref 0.0–4.4)
Cholesterol, Total: 157 mg/dL (ref 100–199)
HDL: 50 mg/dL (ref >39)
LDL Chol Calc (NIH): 77 mg/dL (ref 0–99)
Triglycerides: 180 mg/dL — ABNORMAL HIGH (ref 0–149)
VLDL Cholesterol Cal: 30 mg/dL (ref 5–40)

## 2019-10-24 LAB — CMP14+EGFR
ALT: 11 IU/L (ref 0–32)
AST: 17 IU/L (ref 0–40)
Albumin/Globulin Ratio: 2.1 (ref 1.2–2.2)
Albumin: 4.6 g/dL (ref 3.8–4.8)
Alkaline Phosphatase: 92 IU/L (ref 39–117)
BUN/Creatinine Ratio: 24 (ref 12–28)
BUN: 24 mg/dL (ref 8–27)
Bilirubin Total: 0.3 mg/dL (ref 0.0–1.2)
CO2: 26 mmol/L (ref 20–29)
Calcium: 10.8 mg/dL — ABNORMAL HIGH (ref 8.7–10.3)
Chloride: 103 mmol/L (ref 96–106)
Creatinine, Ser: 0.99 mg/dL (ref 0.57–1.00)
GFR calc Af Amer: 68 mL/min/{1.73_m2} (ref >59)
GFR calc non Af Amer: 59 mL/min/{1.73_m2} — ABNORMAL LOW (ref >59)
Globulin, Total: 2.2 g/dL (ref 1.5–4.5)
Glucose: 124 mg/dL — ABNORMAL HIGH (ref 65–99)
Potassium: 3.4 mmol/L — ABNORMAL LOW (ref 3.5–5.2)
Sodium: 145 mmol/L — ABNORMAL HIGH (ref 134–144)
Total Protein: 6.8 g/dL (ref 6.0–8.5)

## 2019-10-27 ENCOUNTER — Ambulatory Visit: Payer: PPO | Attending: Internal Medicine

## 2019-10-27 DIAGNOSIS — Z23 Encounter for immunization: Secondary | ICD-10-CM | POA: Insufficient documentation

## 2019-10-27 NOTE — Progress Notes (Signed)
COVID-19 Vaccine Information can be found at: [email or web address] For questions related to vaccine distribution or appointments, please email [email or web address] or call [phone].     Covid-19 Vaccination Clinic  Name:  Lori Cook    MRN: [id number] DOB: 03/23/1951  10/27/2019  [name] was observed post Covid-19 immunization for 15 minutes without incidence. She was provided with Vaccine Information Sheet and instruction to access the V-Safe system.   [name] was instructed to call 911 with any severe reactions post vaccine: Marland Kitchen Difficulty breathing  . Swelling of your face and throat  . A fast heartbeat  . A bad rash all over your body  . Dizziness and weakness    Immunizations Administered    Name Date Dose VIS Date Route   Pfizer COVID-19 Vaccine 10/27/2019 11:24 AM 0.3 mL 08/23/2019 Intramuscular   Manufacturer: [place]   Lot: [id number]   [place]: [id number]

## 2019-10-30 ENCOUNTER — Other Ambulatory Visit: Payer: Self-pay

## 2019-10-30 ENCOUNTER — Ambulatory Visit (INDEPENDENT_AMBULATORY_CARE_PROVIDER_SITE_OTHER): Payer: PPO | Admitting: Family Medicine

## 2019-10-30 DIAGNOSIS — M85851 Other specified disorders of bone density and structure, right thigh: Secondary | ICD-10-CM | POA: Diagnosis not present

## 2019-10-31 ENCOUNTER — Ambulatory Visit: Payer: PPO | Admitting: Orthopaedic Surgery

## 2019-10-31 LAB — PTH, INTACT AND CALCIUM
Calcium: 10.1 mg/dL (ref 8.7–10.3)
PTH: 75 pg/mL — ABNORMAL HIGH (ref 15–65)

## 2019-10-31 LAB — TSH: TSH: 2.34 u[IU]/mL (ref 0.450–4.500)

## 2019-11-05 ENCOUNTER — Ambulatory Visit (INDEPENDENT_AMBULATORY_CARE_PROVIDER_SITE_OTHER): Payer: PPO | Admitting: Orthopaedic Surgery

## 2019-11-05 ENCOUNTER — Encounter: Payer: Self-pay | Admitting: Orthopaedic Surgery

## 2019-11-05 ENCOUNTER — Other Ambulatory Visit: Payer: Self-pay

## 2019-11-05 ENCOUNTER — Ambulatory Visit: Payer: Self-pay

## 2019-11-05 ENCOUNTER — Ambulatory Visit (INDEPENDENT_AMBULATORY_CARE_PROVIDER_SITE_OTHER): Payer: PPO

## 2019-11-05 DIAGNOSIS — M1611 Unilateral primary osteoarthritis, right hip: Secondary | ICD-10-CM | POA: Diagnosis not present

## 2019-11-05 DIAGNOSIS — M545 Low back pain, unspecified: Secondary | ICD-10-CM

## 2019-11-05 DIAGNOSIS — M1612 Unilateral primary osteoarthritis, left hip: Secondary | ICD-10-CM

## 2019-11-05 NOTE — Progress Notes (Signed)
Office Visit Note   Patient: Lori Cook           Date of Birth: March 25, 1951           MRN: [id number] Visit Date: 11/05/2019              Requested by: Forrest Moron, MD [place],  [place] [address] PCP: Forrest Moron, MD   Assessment & Plan: Visit Diagnoses:  1. Primary osteoarthritis of right hip   2. Primary osteoarthritis of left hip   3. Low back pain, unspecified back pain laterality, unspecified chronicity, unspecified whether sciatica present     Plan: Impression is advanced bilateral hip degenerative joint disease.  After thorough discussion on treatment options and reasonable likelihood for pain relief she has elected to proceed with a left total hip replacement.  Risk benefits and rehab and recovery were reviewed with the patient in detail today.  Questions encouraged and answered.  We will schedule her in the near future.  She lives with her daughter at home.  Denies a history of DVT.  Follow-Up Instructions: Return for 2-week postop visit..   Orders:  Orders Placed This Encounter  Procedures  . XR HIP UNILAT W OR W/O PELVIS 2-3 VIEWS LEFT  . XR HIP UNILAT W OR W/O PELVIS 2-3 VIEWS RIGHT  . XR Lumbar Spine 2-3 Views   No orders of the defined types were placed in this encounter.     Procedures: No procedures performed   Clinical Data: No additional findings.   Subjective: Chief Complaint  Patient presents with  . Left Hip - Pain  . Right Hip - Pain    Myndi is a very pleasant 69 year old female who works as a Engineer, building services who comes in for evaluation of bilateral hip pain with questionable low back pain.  She states that this has been a chronic issue and has caused her significant pain and functional limitation for many years.  She is very limited with what she can do as a result.  She has chronic night pain and cannot sleep through the night.  She is unable to ambulate more than a city block and she has trouble swinging her legs into  her car.  She takes Tylenol and naproxen and she does have significant posterior hip pain.  Denies any radicular symptoms.   Review of Systems  Constitutional: Negative.   HENT: Negative.   Eyes: Negative.   Respiratory: Negative.   Cardiovascular: Negative.   Endocrine: Negative.   Musculoskeletal: Negative.   Neurological: Negative.   Hematological: Negative.   Psychiatric/Behavioral: Negative.   All other systems reviewed and are negative.    Objective: Vital Signs: There were no vitals taken for this visit.  Physical Exam Vitals and nursing note reviewed.  Constitutional:      Appearance: She is well-developed.  HENT:     Head: Normocephalic and atraumatic.  Pulmonary:     Effort: Pulmonary effort is normal.  Abdominal:     Palpations: Abdomen is soft.  Musculoskeletal:     Cervical back: Neck supple.  Skin:    General: Skin is warm.     Capillary Refill: Capillary refill takes less than 2 seconds.  Neurological:     Mental Status: She is alert and oriented to person, place, and time.  Psychiatric:        Behavior: Behavior normal.        Thought Content: Thought content normal.  Judgment: Judgment normal.     Ortho Exam Right hip exam shows very limited logroll and range of motion with catching pain with positive Stinchfield sign.  No bony tenderness.  Left hip exam shows very limited logroll and range of motion with catching pain with positive Stinchfield sign.  No bony tenderness.  Lumbar spine exam shows no tenderness palpation.  SI joints are nontender. Specialty Comments:  No specialty comments available.  Imaging: XR HIP UNILAT W OR W/O PELVIS 2-3 VIEWS LEFT  Result Date: 11/05/2019 Severe left hip DJD.  XR HIP UNILAT W OR W/O PELVIS 2-3 VIEWS RIGHT  Result Date: 11/05/2019 Severe right hip DJD.  XR Lumbar Spine 2-3 Views  Result Date: 11/05/2019 Moderate L4-5 and L5-S1 degenerative disc disease.  Lumbar spondylosis.    PMFS  History: Patient Active Problem List   Diagnosis Date Noted  . Calculus of gallbladder without cholecystitis without obstruction 05/07/2016  . Nephrolithiasis 05/07/2016  . Pulmonary nodules 05/07/2016  . Hematuria, microscopic 05/07/2016  . Essential hypertension, benign 04/24/2015  . Allergic rhinitis due to pollen 04/24/2015  . Retinal vein occlusion, branch 04/24/2015  . Retinal vein occlusion 03/08/2015  . Essential hypertension-new onset 03/08/2015  . Hyperlipidemia with target LDL less than 130 09/09/2012   Past Medical History:  Diagnosis Date  . Allergy   . Arthritis   . Blood transfusion without reported diagnosis   . Cholelithiasis    large stone detected by CT 01/2016  . GERD (gastroesophageal reflux disease)   . Hyperlipidemia   . Hypertension   . Nephrolithiasis    74mm kidney stone L by CT 01/2016  . 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 Stroke Father   . Diabetes Father   . Heart disease Father 28       AMI  . Hypertension Sister   . Arthritis Brother   . Mental illness Mother   . Heart disease Paternal Uncle   . Colon cancer Neg Hx   . Colon polyps Neg Hx   . Esophageal cancer Neg Hx   . Rectal cancer Neg Hx   . Stomach cancer Neg Hx     Past Surgical History:  Procedure Laterality Date  . ABDOMINAL HYSTERECTOMY    . APPENDECTOMY    . CHOLECYSTECTOMY    . COLONOSCOPY     [facility]- records purges and destroyed- per pt Normal   . SPINE SURGERY     Social History   Occupational History  . Not on file  Tobacco Use  . Smoking status: Former Smoker    Quit date: 09/08/2010    Years since quitting: 9.1  . Smokeless tobacco: Never Used  Substance and Sexual Activity  . Alcohol use: No  . Drug use: No  . Sexual activity: Not on file

## 2019-11-07 ENCOUNTER — Telehealth: Payer: Self-pay | Admitting: Family Medicine

## 2019-11-07 NOTE — Telephone Encounter (Signed)
Patient would like a call for someone to go over her lab results before her up comng hip replacement surgery that is coming up ? Pt is waiting for a call

## 2019-11-08 NOTE — Telephone Encounter (Signed)
Pt is requesting lab results from 10/30/2019.  Please Advise

## 2019-11-11 ENCOUNTER — Other Ambulatory Visit: Payer: Self-pay

## 2019-11-11 ENCOUNTER — Telehealth: Payer: Self-pay | Admitting: Family Medicine

## 2019-11-11 DIAGNOSIS — E210 Primary hyperparathyroidism: Secondary | ICD-10-CM

## 2019-11-11 DIAGNOSIS — M85851 Other specified disorders of bone density and structure, right thigh: Secondary | ICD-10-CM

## 2019-11-11 NOTE — Telephone Encounter (Signed)
Pt called and wants nurse to give her a call about her blood pressure med and lab results before her surgery on 11/25/19 [phone] Please advise.

## 2019-11-12 NOTE — Telephone Encounter (Signed)
Pt returning Lori Cook call for labs . Please try her back

## 2019-11-12 NOTE — Telephone Encounter (Signed)
LVM for pt to cb regarding her concerns with BP medication. She is also requesting information on her lab results.  Please Advise

## 2019-11-12 NOTE — Telephone Encounter (Signed)
Please let the patient know that I called and left her a voicemail.  Her amlodipine should be started to control her blood pressure before surgery.  Also her calcium improved but her parathyroid levels are elevated so I am going to refer her to Endocrinology for evaluation. Parathyroid hormone elevation can cause low bone density.

## 2019-11-12 NOTE — Telephone Encounter (Signed)
Spoke with pt and she just wanted to know regarding her BP medication if it is ok for her to start taking it being that she is going in for surgery 11/25/2019. I advised her that it would be ok for her to take her BP medication but I will F/U with you to make sure and if there is anything different that you tell me from what we discussed then I will F/U with her to let her know.  Please Advise.

## 2019-11-13 ENCOUNTER — Encounter: Payer: Self-pay | Admitting: *Deleted

## 2019-11-13 ENCOUNTER — Encounter: Payer: Self-pay | Admitting: Family Medicine

## 2019-11-13 NOTE — Telephone Encounter (Signed)
Lm sent mychart message

## 2019-11-18 ENCOUNTER — Ambulatory Visit (INDEPENDENT_AMBULATORY_CARE_PROVIDER_SITE_OTHER): Payer: PPO | Admitting: Family Medicine

## 2019-11-18 ENCOUNTER — Other Ambulatory Visit: Payer: Self-pay

## 2019-11-18 ENCOUNTER — Encounter: Payer: Self-pay | Admitting: Family Medicine

## 2019-11-18 VITALS — BP 132/68 | HR 76 | Temp 98.2°F | Ht 66.0 in | Wt 153.0 lb

## 2019-11-18 DIAGNOSIS — Z01818 Encounter for other preprocedural examination: Secondary | ICD-10-CM | POA: Diagnosis not present

## 2019-11-18 DIAGNOSIS — I10 Essential (primary) hypertension: Secondary | ICD-10-CM

## 2019-11-18 LAB — POCT URINALYSIS DIP (MANUAL ENTRY)
Bilirubin, UA: NEGATIVE
Blood, UA: NEGATIVE
Glucose, UA: NEGATIVE mg/dL
Ketones, POC UA: NEGATIVE mg/dL
Leukocytes, UA: NEGATIVE
Nitrite, UA: NEGATIVE
Protein Ur, POC: NEGATIVE mg/dL
Spec Grav, UA: >=1.03 — AB (ref 1.010–1.025)
Urobilinogen, UA: 0.2 E.U./dL
pH, UA: 5 (ref 5.0–8.0)

## 2019-11-18 NOTE — Telephone Encounter (Signed)
Discussed in the office

## 2019-11-18 NOTE — Progress Notes (Signed)
Established Patient Office Visit  Subjective:  Patient ID: Lori Cook, female    DOB: 1951/03/03  Age: 69 y.o. MRN: [id number]  CC:  Chief Complaint  Patient presents with   Hypertension    f/u    left eye feels funny    started Saturday    surgery [place]    left hip replacement     HPI Lori Cook presents for  Hypertension: Patient here for follow-up of elevated blood pressure. She is not exercising and is adherent to low salt diet.  Blood pressure is well controlled at home. Cardiac symptoms none. Patient denies chest pain, claudication, exertional chest pressure/discomfort, irregular heart beat, near-syncope and orthopnea.  Cardiovascular risk factors: advanced age (older than 17 for men, 44 for women), hypertension and obesity (BMI >= 30 kg/m2). Use of agents associated with hypertension: none. History of target organ damage: none. BP Readings from Last 3 Encounters:  11/18/19 132/68  10/23/19 133/68  11/21/18 126/71    She needs surgical clearance for hip surgery She is scheduled for surgery 11/25/2019 for her left hip. She denies any allergic reactions to anesthesia She did well with surgery in the past.      Past Medical History:  Diagnosis Date   Allergy    Arthritis    Blood transfusion without reported diagnosis    Cholelithiasis    large stone detected by CT 01/2016   GERD (gastroesophageal reflux disease)    Hyperlipidemia    Hypertension    Nephrolithiasis    75m kidney stone L by CT 01/2016   Retinal vein occlusion, 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   Stroke ([facility]    in retina only     Past Surgical History:  Procedure Laterality Date   ABDOMINAL HYSTERECTOMY     APPENDECTOMY     CHOLECYSTECTOMY     COLONOSCOPY     [facility]- records purges and destroyed- per pt Normal    SPINE SURGERY      Family History  Problem Relation Age of Onset   Stroke Father    Diabetes Father    Heart disease Father 590      AMI     Hypertension Sister    Arthritis Brother    Mental illness Mother    Heart disease Paternal Uncle    Colon cancer Neg Hx    Colon polyps Neg Hx    Esophageal cancer Neg Hx    Rectal cancer Neg Hx    Stomach cancer Neg Hx     Social History   Socioeconomic History   Marital status: Single    Spouse name: Not on file   Number of children: Not on file   Years of education: Not on file   Highest education level: Not on file  Occupational History   Not on file  Tobacco Use   Smoking status: Former Smoker    Quit date: 09/08/2010    Years since quitting: 9.2   Smokeless tobacco: Never Used  Substance and Sexual Activity   Alcohol use: No   Drug use: No   Sexual activity: Not on file  Other Topics Concern   Not on file  Social History Narrative   Marital status: single; not dating; not interested in 2018     Children: 2 daughters, 1 son passed away [id number] old.  Two grandchildren (20,8)      Lives: with daughter, grandson.      Employment:  CEducation administratorbusiness full time; one  employee      Tobacco: quit smoking      Alcohol:  None      Exercise: no formal exercise      ADLs: independent with ADLs; drives; no assistant devices.       Advanced Directives: none in 2018.  FULL CODE; no prolonged measures. HCPOA: daughters   Social Determinants of Radio broadcast assistant Strain:    Difficulty of Paying Living Expenses: Not on file  Food Insecurity:    Worried About Charity fundraiser in the Last Year: Not on file   [employer] of Food in the Last Year: Not on file  Transportation Needs:    Lack of Transportation (Medical): Not on file   Lack of Transportation (Non-Medical): Not on file  Physical Activity:    Days of Exercise per Week: Not on file   Minutes of Exercise per Session: Not on file  Stress:    Feeling of Stress : Not on file  Social Connections:    Frequency of Communication with Friends and Family: Not on file   Frequency of  Social Gatherings with Friends and Family: Not on file   Attends Religious Services: Not on file   Active Member of Clubs or Organizations: Not on file   Attends Archivist Meetings: Not on file   Marital Status: Not on file  Intimate Partner Violence:    Fear of Current or Ex-Partner: Not on file   Emotionally Abused: Not on file   Physically Abused: Not on file   Sexually Abused: Not on file    Outpatient Medications Prior to Visit  Medication Sig Dispense Refill   acetaminophen (TYLENOL) 500 MG tablet Take 1,000 mg by mouth every 6 (six) hours as needed (for pain.).     amLODipine (NORVASC) 5 MG tablet Take 1 tablet (5 mg total) by mouth daily. 90 tablet 3   aspirin EC 81 MG tablet Take 81 mg by mouth daily.     carboxymethylcellul-glycerin (OPTIVE) 0.5-0.9 % ophthalmic solution Place 1-2 drops into both eyes 3 (three) times daily as needed for dry eyes (dry/irritated eyes.).     ciprofloxacin-fluocinolone PF (OTOVEL) 0.3-0.025 % SOLN Place 0.25 mLs into the right ear 2 (two) times daily. 14 each 1   famotidine (PEPCID) 20 MG tablet Take 20 mg by mouth daily.      naproxen sodium (ALEVE) 220 MG tablet Take 440 mg by mouth 2 (two) times daily as needed (pain.).     simvastatin (ZOCOR) 40 MG tablet Take 1 tablet by mouth once daily with breakfast 90 tablet 3   Nutritional Supplements (JUICE PLUS FIBRE PO) Take 2 tablets by mouth daily.      acetic acid-hydrocortisone (VOSOL-HC) OTIC solution Place 3 drops into the right ear 3 (three) times daily. (Patient not taking: Reported on 10/23/2019) 10 mL 0   Facility-Administered Medications Prior to Visit  Medication Dose Route Frequency Provider Last Rate Last Admin   0.9 %  sodium chloride infusion  500 mL Intravenous Once Armbruster, Carlota Raspberry, MD        Allergies  Allergen Reactions   Tramadol Nausea And Vomiting   Codeine Other (See Comments)    Headache    ROS Review of Systems  Constitutional:  Negative for activity change and appetite change.  HENT: Negative for congestion, mouth sores and postnasal drip.   Respiratory: Negative for cough, choking, chest tightness, shortness of breath and wheezing.   Cardiovascular: Negative for chest pain,  palpitations and leg swelling.  Gastrointestinal: Negative for abdominal pain, anal bleeding, blood in stool, constipation and diarrhea.  Endocrine: Negative for cold intolerance, heat intolerance and polydipsia.  Genitourinary: Negative for dyspareunia, dysuria, flank pain, frequency and hematuria.  Musculoskeletal: Positive for arthralgias, gait problem and joint swelling.       Bilateral hip arthritis  Neurological: Negative for dizziness, seizures, facial asymmetry, speech difficulty, light-headedness and numbness.  Psychiatric/Behavioral: Negative for agitation and behavioral problems. The patient is not nervous/anxious.       Objective:    Physical Exam  Constitutional: She is oriented to person, place, and time. She appears well-developed and well-nourished.  HENT:  Head: Normocephalic and atraumatic.  Right Ear: External ear normal.  Left Ear: External ear normal.  Eyes: Conjunctivae and EOM are normal.  Cardiovascular: Normal rate, regular rhythm and normal heart sounds.  Pulmonary/Chest: Effort normal and breath sounds normal. No respiratory distress. She has no wheezes. She has no rales.  Abdominal: Soft. Bowel sounds are normal. She exhibits no distension. There is no abdominal tenderness. There is no rebound.  Musculoskeletal:        General: No edema.  Neurological: She is alert and oriented to person, place, and time. She has normal reflexes.  Skin: Skin is warm.  Psychiatric: She has a normal mood and affect. Her behavior is normal. Judgment and thought content normal.    BP 132/68    Pulse 76    Temp 98.2 F (36.8 C) (Temporal)    Ht '5\' 6"'  (1.676 m)    Wt 153 lb (69.4 kg)    SpO2 98%    BMI 24.69 kg/m  Wt Readings  from Last 3 Encounters:  11/18/19 153 lb (69.4 kg)  10/23/19 150 lb 9.6 oz (68.3 kg)  11/21/18 154 lb 4.8 oz (70 kg)     There are no preventive care reminders to display for this patient.  There are no preventive care reminders to display for this patient.  Lab Results  Component Value Date   TSH 2.340 10/30/2019   Lab Results  Component Value Date   WBC 8.1 08/12/2017   HGB 13.3 08/12/2017   HCT 40.7 08/12/2017   MCV 91 08/12/2017   PLT 319 08/12/2017   Lab Results  Component Value Date   NA 145 (H) 10/23/2019   K 3.4 (L) 10/23/2019   CO2 26 10/23/2019   GLUCOSE 124 (H) 10/23/2019   BUN 24 10/23/2019   CREATININE 0.99 10/23/2019   BILITOT 0.3 10/23/2019   ALKPHOS 92 10/23/2019   AST 17 10/23/2019   ALT 11 10/23/2019   PROT 6.8 10/23/2019   ALBUMIN 4.6 10/23/2019   CALCIUM 10.1 10/30/2019   Lab Results  Component Value Date   CHOL 157 10/23/2019   Lab Results  Component Value Date   HDL 50 10/23/2019   Lab Results  Component Value Date   LDLCALC 77 10/23/2019   Lab Results  Component Value Date   TRIG 180 (H) 10/23/2019   Lab Results  Component Value Date   CHOLHDL 3.1 10/23/2019   Lab Results  Component Value Date   HGBA1C 5.6 08/12/2017      Assessment & Plan:   Problem List Items Addressed This Visit      Cardiovascular and Mediastinum   Essential hypertension, benign    Other Visit Diagnoses    Preop examination    -  Primary Will review labs and get form for patient preop clearance   Preop testing  Relevant Orders   CBC with Differential/Platelet   CMP14+EGFR   POCT urinalysis dipstick (Completed)   EKG 12-Lead (Completed)    Essential Hypertension - discussed her bp which remains at goal  No orders of the defined types were placed in this encounter.   Follow-up: No follow-ups on file.    Forrest Moron, MD

## 2019-11-18 NOTE — Patient Instructions (Addendum)
   Continue current dose of amlodipine but if blood pressure increases consistently above 140/90 then increase to amlodipine 10mg  ( which is two pills.)    If you have lab work done today you will be contacted with your lab results within the next 2 weeks.  If you have not heard from [place] then please contact us. The fastest way to get your results is to register for My Chart.   IF you received an x-ray today, you will receive an invoice from [facility]. Please contact [facility] at [phone] with questions or concerns regarding your invoice.   IF you received labwork today, you will receive an invoice from [facility]. Please contact [facility] at [phone] with questions or concerns regarding your invoice.   Our billing staff will not be able to assist you with questions regarding bills from these companies.  You will be contacted with the lab results as soon as they are available. The fastest way to get your results is to activate your My Chart account. Instructions are located on the last page of this paperwork. If you have not heard from [place] regarding the results in 2 weeks, please contact this office.

## 2019-11-19 ENCOUNTER — Ambulatory Visit: Payer: PPO | Attending: Internal Medicine

## 2019-11-19 DIAGNOSIS — Z23 Encounter for immunization: Secondary | ICD-10-CM | POA: Insufficient documentation

## 2019-11-19 LAB — CMP14+EGFR
ALT: 13 IU/L (ref 0–32)
AST: 18 IU/L (ref 0–40)
Albumin/Globulin Ratio: 2 (ref 1.2–2.2)
Albumin: 4.1 g/dL (ref 3.8–4.8)
Alkaline Phosphatase: 85 IU/L (ref 39–117)
BUN/Creatinine Ratio: 22 (ref 12–28)
BUN: 17 mg/dL (ref 8–27)
Bilirubin Total: 0.3 mg/dL (ref 0.0–1.2)
CO2: 21 mmol/L (ref 20–29)
Calcium: 10.1 mg/dL (ref 8.7–10.3)
Chloride: 107 mmol/L — ABNORMAL HIGH (ref 96–106)
Creatinine, Ser: 0.76 mg/dL (ref 0.57–1.00)
GFR calc Af Amer: 94 mL/min/{1.73_m2} (ref >59)
GFR calc non Af Amer: 81 mL/min/{1.73_m2} (ref >59)
Globulin, Total: 2.1 g/dL (ref 1.5–4.5)
Glucose: 86 mg/dL (ref 65–99)
Potassium: 4.4 mmol/L (ref 3.5–5.2)
Sodium: 141 mmol/L (ref 134–144)
Total Protein: 6.2 g/dL (ref 6.0–8.5)

## 2019-11-19 LAB — CBC WITH DIFFERENTIAL/PLATELET
Basophils Absolute: 0 10*3/uL (ref 0.0–0.2)
Basos: 0 %
EOS (ABSOLUTE): 0.1 10*3/uL (ref 0.0–0.4)
Eos: 1 %
Hematocrit: 37.5 % (ref 34.0–46.6)
Hemoglobin: 12.5 g/dL (ref 11.1–15.9)
Immature Grans (Abs): 0 10*3/uL (ref 0.0–0.1)
Immature Granulocytes: 0 %
Lymphocytes Absolute: 2.7 10*3/uL (ref 0.7–3.1)
Lymphs: 27 %
MCH: 29.6 pg (ref 26.6–33.0)
MCHC: 33.3 g/dL (ref 31.5–35.7)
MCV: 89 fL (ref 79–97)
Monocytes Absolute: 1 10*3/uL — ABNORMAL HIGH (ref 0.1–0.9)
Monocytes: 9 %
Neutrophils Absolute: 6.4 10*3/uL (ref 1.4–7.0)
Neutrophils: 63 %
Platelets: 267 10*3/uL (ref 150–450)
RBC: 4.23 x10E6/uL (ref 3.77–5.28)
RDW: 13.4 % (ref 11.7–15.4)
WBC: 10.2 10*3/uL (ref 3.4–10.8)

## 2019-11-19 NOTE — Progress Notes (Signed)
   Covid-19 Vaccination Clinic  Name:  Lori Cook    MRN: [id number] DOB: 10-16-1950  11/19/2019  [name] was observed post Covid-19 immunization for 15 minutes without incident. She was provided with Vaccine Information Sheet and instruction to access the V-Safe system.   [name] was instructed to call 911 with any severe reactions post vaccine: Marland Kitchen Difficulty breathing  . Swelling of face and throat  . A fast heartbeat  . A bad rash all over body  . Dizziness and weakness   Immunizations Administered    Name Date Dose VIS Date Route   Pfizer COVID-19 Vaccine 11/19/2019  3:51 PM 0.3 mL 08/23/2019 Intramuscular   Manufacturer: [place]   Lot: [id number]   [place]: [id number]

## 2019-11-20 ENCOUNTER — Telehealth: Payer: Self-pay | Admitting: Physician Assistant

## 2019-11-20 ENCOUNTER — Other Ambulatory Visit: Payer: Self-pay | Admitting: Physician Assistant

## 2019-11-20 MED ORDER — METHOCARBAMOL 500 MG PO TABS: 500.0000 mg | ORAL_TABLET | Freq: Two times a day (BID) | ORAL | 0 refills | Status: DC | PRN

## 2019-11-20 MED ORDER — OXYCODONE-ACETAMINOPHEN 5-325 MG PO TABS: 1.0000 | ORAL_TABLET | Freq: Four times a day (QID) | ORAL | 0 refills | Status: DC | PRN

## 2019-11-20 MED ORDER — OXYCODONE HCL ER 10 MG PO T12A: 10.0000 mg | EXTENDED_RELEASE_TABLET | Freq: Two times a day (BID) | ORAL | 0 refills | Status: DC

## 2019-11-20 MED ORDER — ONDANSETRON HCL 4 MG PO TABS: 4.0000 mg | ORAL_TABLET | Freq: Three times a day (TID) | ORAL | 0 refills | Status: DC | PRN

## 2019-11-20 MED ORDER — POLYETHYLENE GLYCOL 3350 17 G PO PACK: 17.0000 g | PACK | Freq: Every day | ORAL | 0 refills | Status: DC | PRN

## 2019-11-20 MED ORDER — ASPIRIN EC 81 MG PO TBEC: 81.0000 mg | DELAYED_RELEASE_TABLET | Freq: Two times a day (BID) | ORAL | 0 refills | Status: DC | PRN

## 2019-11-20 NOTE — Progress Notes (Signed)
[place], [address] [place] [address] Phone: [phone] Fax: [phone]  [id number] - [place], [address] [place] [address] Phone: [phone] Fax: [phone]      Your procedure is scheduled on November 25, 2019.  Report to [facility] Main Entrance "A" at 5:30 A.M., and check in at the Admitting office.  Call this number if you have problems the morning of surgery:  [phone]  Call [phone] if you have any questions prior to your surgery date Monday-Friday 8am-4pm    Remember:  Do not eat after midnight the night before your surgery  You may drink clear liquids until 4:30 A.M. the morning of your surgery.   Clear liquids allowed are: Water, Non-Citrus Juices (without pulp), Carbonated Beverages, Clear Tea, Black Coffee Only, and Gatorade  Please complete your PRE-SURGERY ENSURE that was provided to you by 4:30 A.M. the morning of surgery.  Please, if able, drink it in one setting. DO NOT SIP.    Take these medicines the morning of surgery with A SIP OF WATER: amLODipine (NORVASC) ciprofloxacin-fluocinolone PF (OTOVEL) - ear drop famotidine (PEPCID) simvastatin (ZOCOR)  oxyCODONE (OXYCONTIN) acetaminophen (TYLENOL) - as needed carboxymethylcellul-glycerin (OPTIVE) - eye drop as needed methocarbamol (ROBAXIN) - as needed ondansetron (ZOFRAN) - as needed  As of today, STOP taking any Aspirin (unless otherwise instructed by your surgeon), Aleve, Naproxen, Ibuprofen, Motrin, Advil, Goody's, BC's, all herbal medications, fish oil, and all vitamins.    The Morning of Surgery  Do not wear jewelry, make-up or nail polish.  Do not wear lotions, powders, or perfumes or deodorant  Do not shave 48 hours prior to surgery.    Do not bring valuables to the hospital.  [facility] is not responsible for any belongings or valuables.  If you are a smoker, DO NOT  Smoke 24 hours prior to surgery  If you wear a CPAP at night please bring your mask the morning of surgery   Remember that you must have someone to transport you home after your surgery, and remain with you for 24 hours if you are discharged the same day.   Please bring cases for contacts, glasses, hearing aids, dentures or bridgework because it cannot be worn into surgery.    Leave your suitcase in the car.  After surgery it may be brought to your room.  For patients admitted to the hospital, discharge time will be determined by your treatment team.  Patients discharged the day of surgery will not be allowed to drive home.    Special instructions:   [facility]- Preparing For Surgery  Before surgery, you can play an important role. Because skin is not sterile, your skin needs to be as free of germs as possible. You can reduce the number of germs on your skin by washing with CHG (chlorahexidine gluconate) Soap before surgery.  CHG is an antiseptic cleaner which kills germs and bonds with the skin to continue killing germs even after washing.    Oral Hygiene is also important to reduce your risk of infection.  Remember - BRUSH YOUR TEETH THE MORNING OF SURGERY WITH YOUR REGULAR TOOTHPASTE  Please do not use if you have an allergy to CHG or antibacterial soaps. If your skin becomes reddened/irritated stop using the CHG.  Do not shave (including legs and underarms) for at least 48 hours prior to first CHG shower. It is OK to  shave your face.  Please follow these instructions carefully.   1. Shower the NIGHT BEFORE SURGERY and the MORNING OF SURGERY with CHG Soap.   2. If you chose to wash your hair, wash your hair first as usual with your normal shampoo.  3. After you shampoo, rinse your hair and body thoroughly to remove the shampoo.  4. Use CHG as you would any other liquid soap. You can apply CHG directly to the skin and wash gently with a scrungie or a clean washcloth.    5. Apply the CHG Soap to your body ONLY FROM THE NECK DOWN.  Do not use on open wounds or open sores. Avoid contact with your eyes, ears, mouth and genitals (private parts). Wash Face and genitals (private parts)  with your normal soap.   6. Wash thoroughly, paying special attention to the area where your surgery will be performed.  7. Thoroughly rinse your body with warm water from the neck down.  8. DO NOT shower/wash with your normal soap after using and rinsing off the CHG Soap.  9. Pat yourself dry with a CLEAN TOWEL.  10. Wear CLEAN PAJAMAS to bed the night before surgery, wear comfortable clothes the morning of surgery  11. Place CLEAN SHEETS on your bed the night of your first shower and DO NOT SLEEP WITH PETS.    Day of Surgery:  Please shower the morning of surgery with the CHG soap Do not apply any deodorants/lotions. Please wear clean clothes to the hospital/surgery center.   Remember to brush your teeth WITH YOUR REGULAR TOOTHPASTE.   Please read over the following fact sheets that you were given.

## 2019-11-20 NOTE — Telephone Encounter (Signed)
Pharmacist Jaci Standard called and stated the script was written for 8 per 24 hours and CDC recommends only 6. You can leave as is if you write or add for patient NOT to take 6 within 24 hours or simply write a new script.  Please call [facility] @ [phone]

## 2019-11-20 NOTE — Telephone Encounter (Signed)
I would like to keep for 8 as she is having her hip replaced

## 2019-11-21 ENCOUNTER — Encounter (HOSPITAL_COMMUNITY)
Admission: RE | Admit: 2019-11-21 | Discharge: 2019-11-21 | Disposition: A | Discharge status: Home / Self Care | Payer: PPO | Source: Ambulatory Visit | Attending: Orthopaedic Surgery | Admitting: Orthopaedic Surgery

## 2019-11-21 ENCOUNTER — Ambulatory Visit (HOSPITAL_COMMUNITY)
Admission: RE | Admit: 2019-11-21 | Discharge: 2019-11-21 | Disposition: A | Discharge status: Home / Self Care | Payer: PPO | Source: Ambulatory Visit | Attending: Physician Assistant | Admitting: Physician Assistant

## 2019-11-21 ENCOUNTER — Other Ambulatory Visit (HOSPITAL_COMMUNITY)
Admission: RE | Admit: 2019-11-21 | Discharge: 2019-11-21 | Disposition: A | Discharge status: Home / Self Care | Payer: PPO | Source: Ambulatory Visit | Attending: Orthopaedic Surgery | Admitting: Orthopaedic Surgery

## 2019-11-21 ENCOUNTER — Encounter (HOSPITAL_COMMUNITY): Payer: Self-pay

## 2019-11-21 ENCOUNTER — Other Ambulatory Visit: Payer: Self-pay

## 2019-11-21 DIAGNOSIS — K219 Gastro-esophageal reflux disease without esophagitis: Secondary | ICD-10-CM | POA: Diagnosis not present

## 2019-11-21 DIAGNOSIS — Z20822 Contact with and (suspected) exposure to covid-19: Secondary | ICD-10-CM | POA: Diagnosis not present

## 2019-11-21 DIAGNOSIS — M1612 Unilateral primary osteoarthritis, left hip: Secondary | ICD-10-CM | POA: Diagnosis not present

## 2019-11-21 DIAGNOSIS — Z96642 Presence of left artificial hip joint: Secondary | ICD-10-CM | POA: Diagnosis not present

## 2019-11-21 DIAGNOSIS — Z471 Aftercare following joint replacement surgery: Secondary | ICD-10-CM | POA: Diagnosis not present

## 2019-11-21 DIAGNOSIS — Z79899 Other long term (current) drug therapy: Secondary | ICD-10-CM | POA: Diagnosis not present

## 2019-11-21 DIAGNOSIS — E785 Hyperlipidemia, unspecified: Secondary | ICD-10-CM | POA: Insufficient documentation

## 2019-11-21 DIAGNOSIS — I10 Essential (primary) hypertension: Secondary | ICD-10-CM | POA: Diagnosis not present

## 2019-11-21 DIAGNOSIS — Z7982 Long term (current) use of aspirin: Secondary | ICD-10-CM | POA: Diagnosis not present

## 2019-11-21 DIAGNOSIS — Z8673 Personal history of transient ischemic attack (TIA), and cerebral infarction without residual deficits: Secondary | ICD-10-CM | POA: Diagnosis not present

## 2019-11-21 DIAGNOSIS — Z01818 Encounter for other preprocedural examination: Secondary | ICD-10-CM | POA: Diagnosis not present

## 2019-11-21 DIAGNOSIS — Z87891 Personal history of nicotine dependence: Secondary | ICD-10-CM | POA: Diagnosis not present

## 2019-11-21 LAB — SURGICAL PCR SCREEN
MRSA, PCR: NEGATIVE
Staphylococcus aureus: NEGATIVE

## 2019-11-21 LAB — SARS CORONAVIRUS 2 (TAT 6-24 HRS): SARS Coronavirus 2: NEGATIVE

## 2019-11-21 LAB — APTT: aPTT: 41 seconds — ABNORMAL HIGH (ref 24–36)

## 2019-11-21 LAB — PROTIME-INR
INR: 0.9 (ref 0.8–1.2)
Prothrombin Time: 12.5 seconds (ref 11.4–15.2)

## 2019-11-21 NOTE — Progress Notes (Signed)
Spoke with Jeneen Rinks, PA-C about pt having certain labs (CBC, CMP, UA, EKG) done on 11/18/19 at her doctors office. Agreed that labs would not need to be repeated at PAT appt.

## 2019-11-21 NOTE — Progress Notes (Signed)
PCP - Dr. Delia Chimes Cardiologist - Denies  PPM/ICD - Denies  Chest x-ray - 11/21/19 EKG - 11/18/19 Stress Test - Denies ECHO - Denies Cardiac Cath - Denies  Sleep Study - Denies  Pt denies being diabetic.  Blood Thinner Instructions: N/A Aspirin Instructions: Pt instructed to call for instructions on when to stop ASA.  ERAS Protcol - Yes, PRE-SURGERY Ensure  COVID TEST- 11/21/19   Coronavirus Screening  Have you experienced the following symptoms:  Cough yes/no: No Fever (>100.36F)  yes/no: No Runny nose yes/no: No Sore throat yes/no: No Difficulty breathing/shortness of breath  yes/no: No  Have you or a family member traveled in the last 14 days and where? yes/no: No   If the patient indicates "YES" to the above questions, their PAT will be rescheduled to limit the exposure to others and, the surgeon will be notified. THE PATIENT WILL NEED TO BE ASYMPTOMATIC FOR 14 DAYS.   If the patient is not experiencing any of these symptoms, the PAT nurse will instruct them to NOT bring anyone with them to their appointment since they may have these symptoms or traveled as well.   Please remind your patients and families that hospital visitation restrictions are in effect and the importance of the restrictions.     Anesthesia review: Yes, cardiac hx. Abnormal EKG  Patient denies shortness of breath, fever, cough and chest pain at PAT appointment   All instructions explained to the patient, with a verbal understanding of the material. Patient agrees to go over the instructions while at home for a better understanding. Patient also instructed to self quarantine after being tested for COVID-19. The opportunity to ask questions was provided.

## 2019-11-21 NOTE — Telephone Encounter (Signed)
Ok that's fine

## 2019-11-21 NOTE — Telephone Encounter (Signed)
Called [place] back. He states they can only do 6 pills within 24 hours the first Rx per CDC Guidelines.

## 2019-11-22 ENCOUNTER — Telehealth: Payer: Self-pay | Admitting: Physician Assistant

## 2019-11-22 ENCOUNTER — Telehealth: Payer: Self-pay | Admitting: *Deleted

## 2019-11-22 ENCOUNTER — Telehealth: Payer: Self-pay

## 2019-11-22 MED ORDER — TRANEXAMIC ACID 1000 MG/10ML IV SOLN
2000.0000 mg | INTRAVENOUS | Status: AC
  Administered 2019-11-25: 2000 mg via TOPICAL
  Filled 2019-11-22: qty 20

## 2019-11-22 MED ORDER — TRANEXAMIC ACID-NACL 1000-0.7 MG/100ML-% IV SOLN
1000.0000 mg | INTRAVENOUS | Status: AC
  Administered 2019-11-25: 08:00:00 1000 mg via INTRAVENOUS
  Filled 2019-11-22: qty 100

## 2019-11-22 MED ORDER — OXYCODONE-ACETAMINOPHEN 5-325 MG PO TABS: 1.0000 | ORAL_TABLET | Freq: Three times a day (TID) | ORAL | 0 refills | Status: DC | PRN

## 2019-11-22 MED ORDER — BUPIVACAINE LIPOSOME 1.3 % IJ SUSP
10.0000 mL | INTRAMUSCULAR | Status: DC
  Filled 2019-11-22: qty 10

## 2019-11-22 MED ORDER — OXYCODONE HCL ER 10 MG PO T12A: 10.0000 mg | EXTENDED_RELEASE_TABLET | Freq: Two times a day (BID) | ORAL | 0 refills | Status: DC

## 2019-11-22 NOTE — Anesthesia Preprocedure Evaluation (Addendum)
Anesthesia Evaluation  Patient identified by MRN, date of birth, ID band Patient awake    Reviewed: Allergy & Precautions, H&P , NPO status , Patient's Chart, lab work & pertinent test results  Airway Mallampati: I  TM Distance: >3 FB Neck ROM: Full    Dental no notable dental hx. (+) Edentulous Upper, Dental Advisory Given   Pulmonary former smoker,    Pulmonary exam normal breath sounds clear to auscultation       Cardiovascular Exercise Tolerance: Good hypertension, Pt. on medications Normal cardiovascular exam Rhythm:Regular Rate:Normal     Neuro/Psych CVA, No Residual Symptoms negative psych ROS   GI/Hepatic Neg liver ROS, GERD  Medicated,  Endo/Other  negative endocrine ROS  Renal/GU Renal disease  negative genitourinary   Musculoskeletal  (+) Arthritis , Osteoarthritis,    Abdominal   Peds negative pediatric ROS (+)  Hematology negative hematology ROS (+)   Anesthesia Other Findings   Reproductive/Obstetrics negative OB ROS                            Anesthesia Physical Anesthesia Plan  ASA: III  Anesthesia Plan: Spinal and MAC   Post-op Pain Management:    Induction:   PONV Risk Score and Plan:   Airway Management Planned: Nasal Cannula and Simple Face Mask  Additional Equipment:   Intra-op Plan:   Post-operative Plan:   Informed Consent: I have reviewed the patients History and Physical, chart, labs and discussed the procedure including the risks, benefits and alternatives for the proposed anesthesia with the patient or authorized representative who has indicated his/her understanding and acceptance.       Plan Discussed with: CRNA, Surgeon and Anesthesiologist  Anesthesia Plan Comments: (PAT note written 11/22/2019 by Myra Gianotti, PA-C. PTT 41, so she is for repeat STAT PTT on arrival.  )       Anesthesia Quick Evaluation

## 2019-11-22 NOTE — Telephone Encounter (Signed)
Pharmacy needs new script. Please send in. Thanks.

## 2019-11-22 NOTE — Telephone Encounter (Signed)
Dr Erlinda Hong spoke to patient and answered all her questions.

## 2019-11-22 NOTE — Telephone Encounter (Signed)
Dr Erlinda Hong will be sending a new script for Oxycodone.

## 2019-11-22 NOTE — Progress Notes (Signed)
Patient has antibodies present and will need new type and screen sample to send to lab on DOS. Order placed in signed and held.

## 2019-11-22 NOTE — Telephone Encounter (Signed)
RNCM call to patient to discuss her upcoming Left THA with Dr. Erlinda Hong on 11/25/19. Patient and Dr. Erlinda Hong have discussed possibility of being a same day discharge depending on how she does with surgery and therapy that day. Reviewed that patient has a CG that will be able to assist after surgery. She has all DME (FWW, elevated toilet seat, grabber, shoe horn, cane). No DME needed. Anticipate HHPT will be needed. Choice provided and referral made from office for [facility]. Will continue to be available if needs or changes to discharge plan arise. Jamse Arn, RN, BSN, [place] [phone].

## 2019-11-22 NOTE — Telephone Encounter (Signed)
Patient aware.

## 2019-11-22 NOTE — Progress Notes (Signed)
Anesthesia Chart Review:  Case: [id number] Date/Time: 11/25/19 0700   Procedure: LEFT TOTAL HIP ARTHROPLASTY ANTERIOR APPROACH (Left Hip)   Anesthesia type: Spinal   Pre-op diagnosis: left hip degenerative joint disease   Location: [place] OR ROOM 04 / [place] [facility]   Surgeons: Leandrew Koyanagi, MD      DISCUSSION: Patient is a 69 year old female scheduled for the above procedure.  History includes former smoker, HLD, HTN, CVA (retinal branch), GERD.  Preoperative visit with PCP Dr. Nolon Rod on 11/18/19 with labs and EKG.   Preoperative labs revealed PTT 41 with normal PT/INR. Discussed finding with anesthesiologist Josephine Igo, MD. Case is being considered for spinal anesthesia, so repeat STAT PTT on the day of surgery. I also notified Dr. Erlinda Hong.   COVID-19 vaccine 11/19/19. 11/21/19 COVID-19 test negative. Anesthesia team to evaluate on arrival.    VS: BP (!) 131/58   Pulse 77   Temp 36.9 C (Oral)   Resp 18   Ht 5\' 6"  (1.676 m)   Wt 69.2 kg   SpO2 99%   BMI 24.63 kg/m     PROVIDERS: Forrest Moron, MD is PCP    LABS: Labs from 11/18/19 (PCP) and 11/21/19 (PAT) reviewed. Results include: Lab Results  Component Value Date   WBC 10.2 11/18/2019   HGB 12.5 11/18/2019   HCT 37.5 11/18/2019   PLT 267 11/18/2019   GLUCOSE 86 11/18/2019   CHOL 157 10/23/2019   TRIG 180 (H) 10/23/2019   HDL 50 10/23/2019   LDLCALC 77 10/23/2019   ALT 13 11/18/2019   AST 18 11/18/2019   NA 141 11/18/2019   K 4.4 11/18/2019   CL 107 (H) 11/18/2019   CREATININE 0.76 11/18/2019   BUN 17 11/18/2019   CO2 21 11/18/2019   TSH 2.340 10/30/2019   INR 0.9 11/21/2019   PT/INR 12.5/0.9 and PTT 41.    IMAGES: CR 11/21/19: FINDINGS: Lungs are clear.  No pleural effusion or pneumothorax. The heart is normal in size. Mild degenerative changes of the visualized thoracolumbar spine. Cholecystectomy clips. IMPRESSION: Normal chest radiographs.   EKG: 11/18/19:  Sinus  Rhythm  Low voltage in precordial leads.     CV: N/A   Past Medical History:  Diagnosis Date  . Allergy   . Arthritis   . Blood transfusion without reported diagnosis   . Cholelithiasis    large stone detected by CT 01/2016  . GERD (gastroesophageal reflux disease)   . Hyperlipidemia   . Hypertension   . Nephrolithiasis    45mm kidney stone L by CT 01/2016  . 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 Stroke [facility])    in retina only     Past Surgical History:  Procedure Laterality Date  . ABDOMINAL HYSTERECTOMY    . APPENDECTOMY    . CHOLECYSTECTOMY    . COLONOSCOPY     [facility]- records purges and destroyed- per pt Normal   . SPINE SURGERY      MEDICATIONS: . acetaminophen (TYLENOL) 500 MG tablet  . amLODipine (NORVASC) 5 MG tablet  . aspirin EC 81 MG tablet  . carboxymethylcellul-glycerin (OPTIVE) 0.5-0.9 % ophthalmic solution  . ciprofloxacin-fluocinolone PF (OTOVEL) 0.3-0.025 % SOLN  . famotidine (PEPCID) 20 MG tablet  . methocarbamol (ROBAXIN) 500 MG tablet  . naproxen sodium (ALEVE) 220 MG tablet  . Nutritional Supplements (JUICE PLUS FIBRE PO)  . ondansetron (ZOFRAN) 4 MG tablet  . oxyCODONE (OXYCONTIN) 10 mg 12 hr tablet  . oxyCODONE-acetaminophen (PERCOCET) 5-325  MG tablet  . polyethylene glycol (MIRALAX) 17 g packet  . simvastatin (ZOCOR) 40 MG tablet   . 0.9 %  sodium chloride infusion     Myra Gianotti, PA-C Surgical Short Stay/Anesthesiology [facility] Phone [phone] [facility] Phone [phone] 11/22/2019 12:31 PM

## 2019-11-22 NOTE — Telephone Encounter (Signed)
Oxycontin is on back order per [employer].   Please send over to [facility] instead. Once this is done we can call her to let her know.    [facility] [address]  Patients call back number [phone]

## 2019-11-22 NOTE — Telephone Encounter (Signed)
done

## 2019-11-22 NOTE — Addendum Note (Signed)
Addended by: Azucena Cecil on: 11/22/2019 11:52 AM   Modules accepted: Orders

## 2019-11-22 NOTE — Telephone Encounter (Signed)
Patient called.   She says her pharmacy is having an issue processing her prescription due to a formatting error. She is also requesting a call back to discuss the dosage of her other medications.   Call back: [phone]

## 2019-11-25 ENCOUNTER — Ambulatory Visit (HOSPITAL_COMMUNITY): Payer: PPO | Admitting: Vascular Surgery

## 2019-11-25 ENCOUNTER — Ambulatory Visit (HOSPITAL_COMMUNITY): Payer: PPO | Admitting: Physician Assistant

## 2019-11-25 ENCOUNTER — Encounter (HOSPITAL_COMMUNITY)
Admission: RE | Disposition: A | Discharge status: Home / Self Care | Payer: Self-pay | Source: Home / Self Care | Attending: Orthopaedic Surgery

## 2019-11-25 ENCOUNTER — Encounter (HOSPITAL_COMMUNITY): Payer: Self-pay | Admitting: Orthopaedic Surgery

## 2019-11-25 ENCOUNTER — Other Ambulatory Visit: Payer: Self-pay | Admitting: Physician Assistant

## 2019-11-25 ENCOUNTER — Ambulatory Visit (HOSPITAL_COMMUNITY): Payer: PPO

## 2019-11-25 ENCOUNTER — Observation Stay (HOSPITAL_COMMUNITY): Payer: PPO

## 2019-11-25 ENCOUNTER — Observation Stay (HOSPITAL_COMMUNITY)
Admission: RE | Admit: 2019-11-25 | Discharge: 2019-11-25 | Disposition: A | Discharge status: Home / Self Care | Payer: PPO | Attending: Orthopaedic Surgery | Admitting: Orthopaedic Surgery

## 2019-11-25 ENCOUNTER — Other Ambulatory Visit: Payer: Self-pay

## 2019-11-25 DIAGNOSIS — Z419 Encounter for procedure for purposes other than remedying health state, unspecified: Secondary | ICD-10-CM

## 2019-11-25 DIAGNOSIS — Z8249 Family history of ischemic heart disease and other diseases of the circulatory system: Secondary | ICD-10-CM | POA: Diagnosis not present

## 2019-11-25 DIAGNOSIS — K219 Gastro-esophageal reflux disease without esophagitis: Secondary | ICD-10-CM | POA: Insufficient documentation

## 2019-11-25 DIAGNOSIS — E785 Hyperlipidemia, unspecified: Secondary | ICD-10-CM | POA: Insufficient documentation

## 2019-11-25 DIAGNOSIS — M1612 Unilateral primary osteoarthritis, left hip: Principal | ICD-10-CM | POA: Insufficient documentation

## 2019-11-25 DIAGNOSIS — Z8261 Family history of arthritis: Secondary | ICD-10-CM | POA: Insufficient documentation

## 2019-11-25 DIAGNOSIS — Z7982 Long term (current) use of aspirin: Secondary | ICD-10-CM | POA: Insufficient documentation

## 2019-11-25 DIAGNOSIS — Z8673 Personal history of transient ischemic attack (TIA), and cerebral infarction without residual deficits: Secondary | ICD-10-CM | POA: Diagnosis not present

## 2019-11-25 DIAGNOSIS — Z96642 Presence of left artificial hip joint: Secondary | ICD-10-CM | POA: Diagnosis not present

## 2019-11-25 DIAGNOSIS — Z79899 Other long term (current) drug therapy: Secondary | ICD-10-CM | POA: Diagnosis not present

## 2019-11-25 DIAGNOSIS — I10 Essential (primary) hypertension: Secondary | ICD-10-CM | POA: Diagnosis not present

## 2019-11-25 DIAGNOSIS — Z96649 Presence of unspecified artificial hip joint: Secondary | ICD-10-CM

## 2019-11-25 DIAGNOSIS — Z87891 Personal history of nicotine dependence: Secondary | ICD-10-CM | POA: Diagnosis not present

## 2019-11-25 DIAGNOSIS — Z885 Allergy status to narcotic agent status: Secondary | ICD-10-CM | POA: Insufficient documentation

## 2019-11-25 DIAGNOSIS — Z1211 Encounter for screening for malignant neoplasm of colon: Secondary | ICD-10-CM

## 2019-11-25 DIAGNOSIS — J301 Allergic rhinitis due to pollen: Secondary | ICD-10-CM | POA: Diagnosis not present

## 2019-11-25 DIAGNOSIS — Z888 Allergy status to other drugs, medicaments and biological substances status: Secondary | ICD-10-CM | POA: Insufficient documentation

## 2019-11-25 DIAGNOSIS — Z471 Aftercare following joint replacement surgery: Secondary | ICD-10-CM | POA: Diagnosis not present

## 2019-11-25 HISTORY — PX: TOTAL HIP ARTHROPLASTY: SHX124

## 2019-11-25 LAB — TYPE AND SCREEN
ABO/RH(D): O POS
Antibody Screen: POSITIVE

## 2019-11-25 LAB — APTT: aPTT: 40 seconds — ABNORMAL HIGH (ref 24–36)

## 2019-11-25 SURGERY — ARTHROPLASTY, HIP, TOTAL, ANTERIOR APPROACH
Anesthesia: Monitor Anesthesia Care | Site: Hip | Laterality: Left

## 2019-11-25 MED ORDER — OXYCODONE HCL 5 MG PO TABS
ORAL_TABLET | ORAL | Status: AC
  Filled 2019-11-25: qty 1

## 2019-11-25 MED ORDER — DOCUSATE SODIUM 100 MG PO CAPS
100.0000 mg | ORAL_CAPSULE | Freq: Two times a day (BID) | ORAL | Status: DC
  Administered 2019-11-25: 100 mg via ORAL
  Filled 2019-11-25: qty 1

## 2019-11-25 MED ORDER — LACTATED RINGERS IV BOLUS: 250.0000 mL | Freq: Once | INTRAVENOUS | Status: DC

## 2019-11-25 MED ORDER — ONDANSETRON HCL 4 MG PO TABS: 4.0000 mg | ORAL_TABLET | Freq: Four times a day (QID) | ORAL | Status: DC | PRN

## 2019-11-25 MED ORDER — LACTATED RINGERS IV BOLUS: 500.0000 mL | Freq: Once | INTRAVENOUS | Status: DC

## 2019-11-25 MED ORDER — METHOCARBAMOL 1000 MG/10ML IJ SOLN
500.0000 mg | Freq: Four times a day (QID) | INTRAVENOUS | Status: DC | PRN
  Filled 2019-11-25: qty 5

## 2019-11-25 MED ORDER — HYDROMORPHONE HCL 1 MG/ML IJ SOLN
0.5000 mg | INTRAMUSCULAR | Status: DC | PRN
  Administered 2019-11-25: 1 mg via INTRAVENOUS
  Filled 2019-11-25: qty 1

## 2019-11-25 MED ORDER — MIDAZOLAM HCL 5 MG/5ML IJ SOLN
INTRAMUSCULAR | Status: DC | PRN
  Administered 2019-11-25: 2 mg via INTRAVENOUS

## 2019-11-25 MED ORDER — MAGNESIUM CITRATE PO SOLN: 1.0000 | Freq: Once | ORAL | Status: DC | PRN

## 2019-11-25 MED ORDER — SODIUM CHLORIDE 0.9 % IR SOLN
Status: DC | PRN
  Administered 2019-11-25: 3000 mL

## 2019-11-25 MED ORDER — DIPHENHYDRAMINE HCL 12.5 MG/5ML PO ELIX: 25.0000 mg | ORAL_SOLUTION | ORAL | Status: DC | PRN

## 2019-11-25 MED ORDER — VANCOMYCIN HCL 1 G IV SOLR
INTRAVENOUS | Status: DC | PRN
  Administered 2019-11-25: 1000 mg via TOPICAL

## 2019-11-25 MED ORDER — VANCOMYCIN HCL 1000 MG IV SOLR
INTRAVENOUS | Status: AC
  Filled 2019-11-25: qty 1000

## 2019-11-25 MED ORDER — MENTHOL 3 MG MT LOZG: 1.0000 | LOZENGE | OROMUCOSAL | Status: DC | PRN

## 2019-11-25 MED ORDER — AMLODIPINE BESYLATE 5 MG PO TABS: 5.0000 mg | ORAL_TABLET | Freq: Every day | ORAL | Status: DC

## 2019-11-25 MED ORDER — POVIDONE-IODINE 10 % EX SWAB: 2.0000 "application " | Freq: Once | CUTANEOUS | Status: DC

## 2019-11-25 MED ORDER — ACETAMINOPHEN 500 MG PO TABS
1000.0000 mg | ORAL_TABLET | Freq: Four times a day (QID) | ORAL | Status: DC
  Administered 2019-11-25: 1000 mg via ORAL
  Filled 2019-11-25: qty 2

## 2019-11-25 MED ORDER — OXYCODONE HCL ER 10 MG PO T12A
10.0000 mg | EXTENDED_RELEASE_TABLET | Freq: Two times a day (BID) | ORAL | Status: DC
  Administered 2019-11-25: 10 mg via ORAL
  Filled 2019-11-25: qty 1

## 2019-11-25 MED ORDER — CHLORHEXIDINE GLUCONATE 4 % EX LIQD: 60.0000 mL | Freq: Once | CUTANEOUS | Status: DC

## 2019-11-25 MED ORDER — KETOROLAC TROMETHAMINE 15 MG/ML IJ SOLN
15.0000 mg | Freq: Four times a day (QID) | INTRAMUSCULAR | Status: DC
  Administered 2019-11-25: 15 mg via INTRAVENOUS
  Filled 2019-11-25: qty 1

## 2019-11-25 MED ORDER — LACTATED RINGERS IV SOLN: INTRAVENOUS | Status: DC

## 2019-11-25 MED ORDER — OXYCODONE HCL 5 MG PO TABS
5.0000 mg | ORAL_TABLET | Freq: Once | ORAL | Status: AC | PRN
  Administered 2019-11-25: 5 mg via ORAL

## 2019-11-25 MED ORDER — ACETAMINOPHEN 160 MG/5ML PO SOLN: 325.0000 mg | ORAL | Status: DC | PRN

## 2019-11-25 MED ORDER — FAMOTIDINE 20 MG PO TABS: 20.0000 mg | ORAL_TABLET | Freq: Every day | ORAL | Status: DC

## 2019-11-25 MED ORDER — OXYCODONE HCL 5 MG PO TABS: 10.0000 mg | ORAL_TABLET | ORAL | Status: DC | PRN

## 2019-11-25 MED ORDER — DEXAMETHASONE SODIUM PHOSPHATE 10 MG/ML IJ SOLN: 10.0000 mg | Freq: Once | INTRAMUSCULAR | Status: DC

## 2019-11-25 MED ORDER — TRANEXAMIC ACID-NACL 1000-0.7 MG/100ML-% IV SOLN
1000.0000 mg | Freq: Once | INTRAVENOUS | Status: AC
  Administered 2019-11-25: 1000 mg via INTRAVENOUS
  Filled 2019-11-25: qty 100

## 2019-11-25 MED ORDER — SODIUM CHLORIDE 0.9 % IV SOLN
INTRAVENOUS | Status: DC | PRN
  Administered 2019-11-25: 20 mL

## 2019-11-25 MED ORDER — CEFAZOLIN SODIUM-DEXTROSE 2-4 GM/100ML-% IV SOLN
2.0000 g | Freq: Four times a day (QID) | INTRAVENOUS | Status: DC
  Administered 2019-11-25: 2 g via INTRAVENOUS
  Filled 2019-11-25: qty 100

## 2019-11-25 MED ORDER — OXYCODONE HCL 5 MG/5ML PO SOLN: 5.0000 mg | Freq: Once | ORAL | Status: AC | PRN

## 2019-11-25 MED ORDER — 0.9 % SODIUM CHLORIDE (POUR BTL) OPTIME
TOPICAL | Status: DC | PRN
  Administered 2019-11-25: 1000 mL

## 2019-11-25 MED ORDER — PROPOFOL 500 MG/50ML IV EMUL
INTRAVENOUS | Status: DC | PRN
  Administered 2019-11-25: 100 ug/kg/min via INTRAVENOUS

## 2019-11-25 MED ORDER — MIDAZOLAM HCL 2 MG/2ML IJ SOLN
INTRAMUSCULAR | Status: AC
  Filled 2019-11-25: qty 2

## 2019-11-25 MED ORDER — BUPIVACAINE IN DEXTROSE 0.75-8.25 % IT SOLN
INTRATHECAL | Status: DC | PRN
  Administered 2019-11-25: 1.8 mL via INTRATHECAL

## 2019-11-25 MED ORDER — SORBITOL 70 % SOLN: 30.0000 mL | Freq: Every day | Status: DC | PRN

## 2019-11-25 MED ORDER — ACETAMINOPHEN 325 MG PO TABS: 325.0000 mg | ORAL_TABLET | ORAL | Status: DC | PRN

## 2019-11-25 MED ORDER — GABAPENTIN 300 MG PO CAPS
300.0000 mg | ORAL_CAPSULE | Freq: Three times a day (TID) | ORAL | Status: DC
  Administered 2019-11-25: 300 mg via ORAL
  Filled 2019-11-25: qty 1

## 2019-11-25 MED ORDER — ACETAMINOPHEN 500 MG PO TABS: 1000.0000 mg | ORAL_TABLET | Freq: Four times a day (QID) | ORAL | Status: DC | PRN

## 2019-11-25 MED ORDER — POLYETHYLENE GLYCOL 3350 17 G PO PACK: 17.0000 g | PACK | Freq: Every day | ORAL | Status: DC | PRN

## 2019-11-25 MED ORDER — ONDANSETRON HCL 4 MG/2ML IJ SOLN: 4.0000 mg | Freq: Once | INTRAMUSCULAR | Status: DC | PRN

## 2019-11-25 MED ORDER — SODIUM CHLORIDE 0.9 % IV SOLN: INTRAVENOUS | Status: DC

## 2019-11-25 MED ORDER — PHENOL 1.4 % MT LIQD: 1.0000 | OROMUCOSAL | Status: DC | PRN

## 2019-11-25 MED ORDER — METOCLOPRAMIDE HCL 5 MG PO TABS: 5.0000 mg | ORAL_TABLET | Freq: Three times a day (TID) | ORAL | Status: DC | PRN

## 2019-11-25 MED ORDER — PHENYLEPHRINE HCL-NACL 10-0.9 MG/250ML-% IV SOLN
INTRAVENOUS | Status: DC | PRN
  Administered 2019-11-25: 20 ug/min via INTRAVENOUS

## 2019-11-25 MED ORDER — CEFAZOLIN SODIUM-DEXTROSE 2-4 GM/100ML-% IV SOLN
2.0000 g | INTRAVENOUS | Status: AC
  Administered 2019-11-25: 07:00:00 2 g via INTRAVENOUS
  Filled 2019-11-25: qty 100

## 2019-11-25 MED ORDER — POLYVINYL ALCOHOL 1.4 % OP SOLN
1.0000 [drp] | Freq: Three times a day (TID) | OPHTHALMIC | Status: DC | PRN
  Filled 2019-11-25: qty 15

## 2019-11-25 MED ORDER — FENTANYL CITRATE (PF) 250 MCG/5ML IJ SOLN
INTRAMUSCULAR | Status: AC
  Filled 2019-11-25: qty 5

## 2019-11-25 MED ORDER — METOCLOPRAMIDE HCL 5 MG/ML IJ SOLN: 5.0000 mg | Freq: Three times a day (TID) | INTRAMUSCULAR | Status: DC | PRN

## 2019-11-25 MED ORDER — BUPIVACAINE HCL (PF) 0.25 % IJ SOLN
INTRAMUSCULAR | Status: AC
  Filled 2019-11-25: qty 30

## 2019-11-25 MED ORDER — ONDANSETRON HCL 4 MG/2ML IJ SOLN
INTRAMUSCULAR | Status: DC | PRN
  Administered 2019-11-25: 4 mg via INTRAVENOUS

## 2019-11-25 MED ORDER — LACTATED RINGERS IV BOLUS
500.0000 mL | Freq: Once | INTRAVENOUS | Status: AC
  Administered 2019-11-25: 500 mL via INTRAVENOUS

## 2019-11-25 MED ORDER — ONDANSETRON HCL 4 MG/2ML IJ SOLN
INTRAMUSCULAR | Status: AC
  Filled 2019-11-25: qty 2

## 2019-11-25 MED ORDER — LACTATED RINGERS IV SOLN: INTRAVENOUS | Status: DC | PRN

## 2019-11-25 MED ORDER — OXYCODONE HCL 5 MG PO TABS
5.0000 mg | ORAL_TABLET | ORAL | Status: DC | PRN
  Administered 2019-11-25: 5 mg via ORAL
  Filled 2019-11-25: qty 1

## 2019-11-25 MED ORDER — ACETAMINOPHEN 325 MG PO TABS: 325.0000 mg | ORAL_TABLET | Freq: Four times a day (QID) | ORAL | Status: DC | PRN

## 2019-11-25 MED ORDER — MEPERIDINE HCL 25 MG/ML IJ SOLN: 6.2500 mg | INTRAMUSCULAR | Status: DC | PRN

## 2019-11-25 MED ORDER — ALUM & MAG HYDROXIDE-SIMETH 200-200-20 MG/5ML PO SUSP: 30.0000 mL | ORAL | Status: DC | PRN

## 2019-11-25 MED ORDER — SODIUM CHLORIDE 0.9% FLUSH
INTRAVENOUS | Status: DC | PRN
  Administered 2019-11-25 (×2): 10 mL

## 2019-11-25 MED ORDER — ASPIRIN 81 MG PO CHEW: 81.0000 mg | CHEWABLE_TABLET | Freq: Two times a day (BID) | ORAL | Status: DC

## 2019-11-25 MED ORDER — EPINEPHRINE PF 1 MG/ML IJ SOLN
INTRAMUSCULAR | Status: AC
  Filled 2019-11-25: qty 1

## 2019-11-25 MED ORDER — SODIUM CHLORIDE 0.9 % IV SOLN: 500.0000 mL | Freq: Once | INTRAVENOUS | Status: DC

## 2019-11-25 MED ORDER — FENTANYL CITRATE (PF) 100 MCG/2ML IJ SOLN
INTRAMUSCULAR | Status: AC
  Filled 2019-11-25: qty 2

## 2019-11-25 MED ORDER — ONDANSETRON HCL 4 MG/2ML IJ SOLN
4.0000 mg | Freq: Four times a day (QID) | INTRAMUSCULAR | Status: DC | PRN
  Administered 2019-11-25: 4 mg via INTRAVENOUS
  Filled 2019-11-25: qty 2

## 2019-11-25 MED ORDER — FENTANYL CITRATE (PF) 100 MCG/2ML IJ SOLN
25.0000 ug | INTRAMUSCULAR | Status: DC | PRN
  Administered 2019-11-25 (×2): 25 ug via INTRAVENOUS

## 2019-11-25 MED ORDER — BUPIVACAINE-EPINEPHRINE 0.25% -1:200000 IJ SOLN
INTRAMUSCULAR | Status: DC | PRN
  Administered 2019-11-25: 20 mL

## 2019-11-25 MED ORDER — SIMVASTATIN 20 MG PO TABS: 40.0000 mg | ORAL_TABLET | Freq: Every day | ORAL | Status: DC

## 2019-11-25 MED ORDER — METHOCARBAMOL 500 MG PO TABS: 750.0000 mg | ORAL_TABLET | Freq: Four times a day (QID) | ORAL | Status: DC | PRN

## 2019-11-25 SURGICAL SUPPLY — 60 items
BAG DECANTER FOR FLEXI CONT (MISCELLANEOUS) ×3 IMPLANT
CELLS DAT CNTRL 66122 CELL SVR (MISCELLANEOUS) IMPLANT
COVER PERINEAL POST (MISCELLANEOUS) ×3 IMPLANT
COVER SURGICAL LIGHT HANDLE (MISCELLANEOUS) ×3 IMPLANT
COVER WAND RF STERILE (DRAPES) ×3 IMPLANT
DERMABOND ADHESIVE PROPEN (GAUZE/BANDAGES/DRESSINGS) ×2
DERMABOND ADVANCED .7 DNX6 (GAUZE/BANDAGES/DRESSINGS) ×1 IMPLANT
DRAPE C-ARM 42X72 X-RAY (DRAPES) ×3 IMPLANT
DRAPE POUCH INSTRU U-SHP 10X18 (DRAPES) ×3 IMPLANT
DRAPE STERI IOBAN 125X83 (DRAPES) ×3 IMPLANT
DRAPE U-SHAPE 47X51 STRL (DRAPES) ×6 IMPLANT
DRSG AQUACEL AG ADV 3.5X10 (GAUZE/BANDAGES/DRESSINGS) ×3 IMPLANT
DURAPREP 26ML APPLICATOR (WOUND CARE) ×6 IMPLANT
ELECT BLADE 4.0 EZ CLEAN MEGAD (MISCELLANEOUS) ×3
ELECT REM PT RETURN 9FT ADLT (ELECTROSURGICAL) ×3
ELECTRODE BLDE 4.0 EZ CLN MEGD (MISCELLANEOUS) ×1 IMPLANT
ELECTRODE REM PT RTRN 9FT ADLT (ELECTROSURGICAL) ×1 IMPLANT
GLOVE BIOGEL PI IND STRL 7.0 (GLOVE) ×1 IMPLANT
GLOVE BIOGEL PI INDICATOR 7.0 (GLOVE) ×2
GLOVE ECLIPSE 7.0 STRL STRAW (GLOVE) ×6 IMPLANT
GLOVE SKINSENSE NS SZ7.5 (GLOVE) ×2
GLOVE SKINSENSE STRL SZ7.5 (GLOVE) ×1 IMPLANT
GLOVE SURG SYN 7.5  E (GLOVE) ×12
GLOVE SURG SYN 7.5 E (GLOVE) ×4 IMPLANT
GOWN STRL REIN XL XLG (GOWN DISPOSABLE) ×3 IMPLANT
GOWN STRL REUS W/ TWL LRG LVL3 (GOWN DISPOSABLE) IMPLANT
GOWN STRL REUS W/ TWL XL LVL3 (GOWN DISPOSABLE) ×1 IMPLANT
GOWN STRL REUS W/TWL LRG LVL3 (GOWN DISPOSABLE)
GOWN STRL REUS W/TWL XL LVL3 (GOWN DISPOSABLE) ×3
HANDPIECE INTERPULSE COAX TIP (DISPOSABLE) ×3
HEAD CERAMIC DELTA 36 PLUS 1.5 (Hips) ×3 IMPLANT
HOOD PEEL AWAY FLYTE STAYCOOL (MISCELLANEOUS) ×6 IMPLANT
IV NS IRRIG 3000ML ARTHROMATIC (IV SOLUTION) ×3 IMPLANT
KIT BASIN OR (CUSTOM PROCEDURE TRAY) ×3 IMPLANT
LINER NEUTRAL 52X36MM PLUS 4 (Liner) ×3 IMPLANT
MARKER SKIN DUAL TIP RULER LAB (MISCELLANEOUS) ×3 IMPLANT
NEEDLE SPNL 18GX3.5 QUINCKE PK (NEEDLE) ×3 IMPLANT
PACK TOTAL JOINT (CUSTOM PROCEDURE TRAY) ×3 IMPLANT
PACK UNIVERSAL I (CUSTOM PROCEDURE TRAY) ×3 IMPLANT
PIN SECTOR W/GRIP ACE CUP 52MM (Hips) ×3 IMPLANT
RTRCTR WOUND ALEXIS 18CM MED (MISCELLANEOUS)
SAW OSC TIP CART 19.5X105X1.3 (SAW) ×3 IMPLANT
SCREW 6.5MMX25MM (Screw) ×3 IMPLANT
SET HNDPC FAN SPRY TIP SCT (DISPOSABLE) ×1 IMPLANT
STAPLER VISISTAT 35W (STAPLE) IMPLANT
STEM FEMORAL SZ 6MM STD ACTIS (Stem) ×3 IMPLANT
SUT ETHIBOND 2 V 37 (SUTURE) ×3 IMPLANT
SUT ETHILON 3 0 PS 1 (SUTURE) ×3 IMPLANT
SUT VIC AB 0 CT1 27 (SUTURE) ×3
SUT VIC AB 0 CT1 27XBRD ANBCTR (SUTURE) ×1 IMPLANT
SUT VIC AB 1 CTX 36 (SUTURE) ×3
SUT VIC AB 1 CTX36XBRD ANBCTR (SUTURE) ×1 IMPLANT
SUT VIC AB 2-0 CT1 27 (SUTURE) ×6
SUT VIC AB 2-0 CT1 TAPERPNT 27 (SUTURE) ×2 IMPLANT
SYR 50ML LL SCALE MARK (SYRINGE) ×3 IMPLANT
TOWEL GREEN STERILE (TOWEL DISPOSABLE) ×3 IMPLANT
TRAY CATH 16FR W/PLASTIC CATH (SET/KITS/TRAYS/PACK) IMPLANT
TRAY FOLEY W/BAG SLVR 16FR (SET/KITS/TRAYS/PACK) ×3
TRAY FOLEY W/BAG SLVR 16FR ST (SET/KITS/TRAYS/PACK) ×1 IMPLANT
YANKAUER SUCT BULB TIP NO VENT (SUCTIONS) ×3 IMPLANT

## 2019-11-25 NOTE — Telephone Encounter (Signed)
Looks like xu did fixed it this am

## 2019-11-25 NOTE — Op Note (Signed)
LEFT TOTAL HIP ARTHROPLASTY ANTERIOR APPROACH  Procedure Note Lori Cook   [place]:[id number]  Pre-op Diagnosis: left hip degenerative joint disease     Post-op Diagnosis: same   Operative Procedures  1. Total hip replacement; Left hip; uncemented [id number]   Personnel  Surgeon(s): Leandrew Koyanagi, MD  Assist: Madalyn Rob, PA-C; necessary for the timely completion of procedure and due to complexity of procedure.   Anesthesia: spinal, local  Prosthesis: Depuy Acetabulum: Pinnacle 52 mm Femur: Actis 6 STD Head: 36 mm size: +1.5 Liner: +4 neutral Bearing Type: ceramic on poly  Total Hip Arthroplasty (Anterior Approach) Op Note:  After informed consent was obtained and the operative extremity marked in the holding area, the patient was brought back to the operating room and placed supine on the HANA table. Next, the operative extremity was prepped and draped in normal sterile fashion. Surgical timeout occurred verifying patient identification, surgical site, surgical procedure and administration of antibiotics.  A modified anterior Smith-Peterson approach to the hip was performed, using the interval between tensor fascia lata and sartorius.  Dissection was carried bluntly down onto the anterior hip capsule. The lateral femoral circumflex vessels were identified and coagulated. A capsulotomy was performed and the capsular flaps tagged for later repair.  The neck osteotomy was performed. The femoral head was removed, the acetabular rim was cleared of soft tissue and calcified labrum and attention was turned to reaming the acetabulum.  Sequential reaming was performed under fluoroscopic guidance. We reamed to a size 51 mm, and then impacted the acetabular shell. A single cancellous screw was placed through the cup.  The liner was then placed after irrigation and attention turned to the femur.  After placing the femoral hook, the leg was taken to externally rotated, extended and  adducted position taking care to perform soft tissue releases to allow for adequate mobilization of the femur. Soft tissue was cleared from the shoulder of the greater trochanter and the hook elevator used to improve exposure of the proximal femur. Sequential broaching performed up to a size 6. Trial neck and head were placed. The leg was brought back up to neutral and the construct reduced. The position and sizing of components, offset and leg lengths were checked using fluoroscopy. Stability of the construct was checked in extension and external rotation without any subluxation or impingement of prosthesis. We dislocated the prosthesis, dropped the leg back into position, removed trial components, and irrigated copiously. The final stem and head was then placed, the leg brought back up, the system reduced and fluoroscopy used to verify positioning.  We irrigated, obtained hemostasis and closed the capsule using #2 ethibond suture.  One gram of vancomycin powder was placed in the surgical bed. The fascia was closed with #1 vicryl plus, the deep fat layer was closed with 0 vicryl, the subcutaneous layers closed with 2.0 Vicryl Plus and the skin closed with 2.0 nylon and dermabond. A sterile dressing was applied. The patient was awakened in the operating room and taken to recovery in stable condition.  All sponge, needle, and instrument counts were correct at the end of the case.   Position: supine  Complications: see description of procedure.  Time Out: performed   Drains/Packing: none  Estimated blood loss: see anesthesia record  Returned to Recovery Room: in good condition.   Antibiotics: yes   Mechanical VTE (DVT) Prophylaxis: sequential compression devices, TED thigh-high  Chemical VTE (DVT) Prophylaxis: aspirin   Fluid Replacement: see anesthesia record  Specimens Removed: 1 to pathology   Sponge and Instrument Count Correct? yes   PACU: portable radiograph - low AP   Plan/RTC: Return  in 2 weeks for staple removal. Weight Bearing/Load Lower Extremity: full  Hip precautions: none Suture Removal: 2 weeks   N. Eduard Roux, MD [facility] 8:49 AM   Implant Name Type Inv. Item Serial No. Manufacturer Lot No. LRB No. Used Action  SCREW 6.5MMX25MM - [id number] Screw SCREW 6.5MMX25MM  DEPUY SYNTHES [id number] Left 1 Implanted  PIN SECTOR W/GRIP ACE CUP 52MM - [id number] Hips PIN SECTOR W/GRIP ACE CUP 52MM  DEPUY SYNTHES [id number] Left 1 Implanted  LINER NEUTRAL 52X36MM PLUS 4 - [id number] Liner LINER NEUTRAL 52X36MM PLUS 4  DEPUY SYNTHES [id number] Left 1 Implanted  STEM FEMORAL SZ 6MM STD ACTIS - [id number] Stem STEM FEMORAL SZ 6MM STD ACTIS  DEPUY ORTHOPAEDICS [id number] Left 1 Implanted  HEAD CERAMIC DELTA 36 PLUS 1.5 - [id number] Hips HEAD CERAMIC DELTA 36 PLUS 1.5  DEPUY SYNTHES [id number] Left 1 Implanted

## 2019-11-25 NOTE — Evaluation (Signed)
Physical Therapy Evaluation Patient Details Name: Lori Cook MRN: [place]:[id number] DOB: 01-Jun-1951 Today's Date: 11/25/2019   History of Present Illness  Pt is a 69 y/o female s/p L THA, direct anterior. PMH includes HTN and CVA.   Clinical Impression  Pt is s/p surgery above with deficits below. Pt tolerated mobility well, and required min guard A for gait and stair navigation. Educated about LLE HEP to perform at home. Pt reports she feels comfortable d/c'ing home today and has performed all necessary mobility tasks. Pt's daughter can provide necessary assist. Will continue to follow acutely to maximize functional mobility independence and safety.     Follow Up Recommendations Follow surgeon's recommendation for DC plan and follow-up therapies;Supervision for mobility/OOB    Equipment Recommendations  None recommended by PT    Recommendations for Other Services       Precautions / Restrictions Precautions Precautions: Fall Restrictions Weight Bearing Restrictions: Yes LLE Weight Bearing: Weight bearing as tolerated      Mobility  Bed Mobility Overal bed mobility: Needs Assistance Bed Mobility: Supine to Sit     Supine to sit: Min assist     General bed mobility comments: Min A For LLE assist. Increased time required secondary to pain   Transfers Overall transfer level: Needs assistance Equipment used: Rolling walker (2 wheeled) Transfers: Sit to/from Stand Sit to Stand: Min guard         General transfer comment: Min guard for safety. Cues for safe hand placement.   Ambulation/Gait Ambulation/Gait assistance: Min guard Gait Distance (Feet): 30 Feet Assistive device: Rolling walker (2 wheeled) Gait Pattern/deviations: Step-to pattern;Decreased step length - right;Decreased step length - left;Decreased weight shift to left;Antalgic Gait velocity: Decreased   General Gait Details: Slow, mildly antalgic gait. Cues for sequencing using RW.   Stairs Stairs:  Yes Stairs assistance: Min guard Stair Management: Two rails;Step to pattern;Forwards Number of Stairs: 2 General stair comments: Cautious stair navigation, however, overall steady. Min guard for safety. Educated pt's daughter about how to guard at home. Cues for LE sequencing.   Wheelchair Mobility    Modified Rankin (Stroke Patients Only)       Balance Overall balance assessment: Needs assistance Sitting-balance support: No upper extremity supported;Feet supported Sitting balance-Leahy Scale: Good     Standing balance support: Bilateral upper extremity supported;During functional activity Standing balance-Leahy Scale: Poor Standing balance comment: Reliant on BUE support                              Pertinent Vitals/Pain Pain Assessment: Faces Faces Pain Scale: Hurts even more Pain Location: L hip  Pain Descriptors / Indicators: Aching;Operative site guarding Pain Intervention(s): Monitored during session;Limited activity within patient's tolerance;Repositioned    Home Living Family/patient expects to be discharged to:: Private residence Living Arrangements: Children Available Help at Discharge: Family;Available 24 hours/day Type of Home: House Home Access: Stairs to enter Entrance Stairs-Rails: Right;Left;Can reach both Entrance Stairs-Number of Steps: 2 Home Layout: One level Home Equipment: Walker - 2 wheels;Bedside commode;Shower seat      Prior Function Level of Independence: Independent         Comments: 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      Hand Dominance        Extremity/Trunk Assessment   Upper Extremity Assessment Upper Extremity Assessment: Overall WFL for tasks assessed    Lower Extremity Assessment Lower Extremity Assessment: LLE deficits/detail LLE Deficits / Details: Deficits consistent  with post op pain and weakness.     Cervical / Trunk Assessment Cervical / Trunk Assessment: Normal  Communication    Communication: No difficulties  Cognition Arousal/Alertness: Awake/alert Behavior During Therapy: WFL for tasks assessed/performed Overall Cognitive Status: Within Functional Limits for tasks assessed                                        General Comments General comments (skin integrity, edema, etc.): Educated in depth about performing LLE HEP at d/c. Gave handout as well.     Exercises Total Joint Exercises Ankle Circles/Pumps: AROM;Both;20 reps   Assessment/Plan    PT Assessment Patient needs continued PT services  PT Problem List Decreased range of motion;Decreased strength;Decreased activity tolerance;Decreased mobility;Decreased knowledge of use of DME;Pain       PT Treatment Interventions DME instruction;Functional mobility training;Stair training;Gait training;Therapeutic activities;Therapeutic exercise;Balance training;Patient/family education    PT Goals (Current goals can be found in the Care Plan section)  Acute Rehab PT Goals Patient Stated Goal: to go home  PT Goal Formulation: With patient/family Time For Goal Achievement: 12/09/19 Potential to Achieve Goals: Good    Frequency 7X/week   Barriers to discharge        Co-evaluation               AM-PAC PT "6 Clicks" Mobility  Outcome Measure Help needed turning from your back to your side while in a flat bed without using bedrails?: A Little Help needed moving from lying on your back to sitting on the side of a flat bed without using bedrails?: A Little Help needed moving to and from a bed to a chair (including a wheelchair)?: A Little Help needed standing up from a chair using your arms (e.g., wheelchair or bedside chair)?: A Little Help needed to walk in hospital room?: A Little Help needed climbing 3-5 steps with a railing? : A Little 6 Click Score: 18    End of Session Equipment Utilized During Treatment: Gait belt Activity Tolerance: Patient tolerated treatment well Patient  left: in chair;with call bell/phone within reach;with family/visitor present Nurse Communication: Mobility status PT Visit Diagnosis: Other abnormalities of gait and mobility (R26.89);Pain Pain - Right/Left: Left Pain - part of body: Hip    Time: 1413-1501 PT Time Calculation (min) (ACUTE ONLY): 48 min   Charges:   PT Evaluation $PT Eval Low Complexity: 1 Low PT Treatments $Gait Training: 23-37 mins        Lou Miner, DPT  Acute Rehabilitation Services  Pager: [phone] Office: [phone]   Rudean Hitt 11/25/2019, 3:09 PM

## 2019-11-25 NOTE — Progress Notes (Signed)
Patient discharging home. Discharge instructions explained to patient including taking Asprin twice a day scheduled per Dr Erlinda Hong and she verbalized understanding. Patient excited about going home. Took all her personal belongings. No further questions or concerns voiced.

## 2019-11-25 NOTE — TOC Transition Note (Signed)
Transition of Care [facility]) - CM/SW Discharge Note   Patient Details  Name: Lori Cook MRN: [place]:[id number] Date of Birth: [birth year]  [facility] CM/SW Contact:  Sharin Mons, RN Phone [place] 11/25/2019, 6:22 PM   Clinical Narrative:     Late entry:  11/25/2019 1715 Presented with arthritis of L hip, s/p LTHR,11/25/2019.Hx of HTN,CVA. NCM spoke with pt @ bedside regarding  TOC needs. Pt is from home with family. Pt states no DME needs , already has rolling walker and  3in1/BSC. Order noted for HHPT. Per pt and noted in epict referral made with [facility] and accepted. Daughter to assist pt with needs once d/c NCM to sign off , no present needs noted.  Final next level of care: [place] Barriers to Discharge: No Barriers Identified   Patient Goals and CMS Choice        Discharge Placement                       Discharge Plan and Services   Social Determinants of Health (SDOH) Interventions     Readmission Risk Interventions No flowsheet data found.

## 2019-11-25 NOTE — Anesthesia Procedure Notes (Signed)
Spinal  Patient location during procedure: OR Start time: 11/25/2019 7:20 AM End time: 11/25/2019 7:24 AM Staffing Anesthesiologist: Janeece Riggers, MD Preanesthetic Checklist Completed: patient identified, IV checked, site marked, risks and benefits discussed, surgical consent, monitors and equipment checked, pre-op evaluation and timeout performed Spinal Block Patient position: sitting Prep: DuraPrep Patient monitoring: heart rate, cardiac monitor, continuous pulse ox and blood pressure Approach: midline Location: L2-3 Injection technique: single-shot Needle Needle type: Sprotte  Needle gauge: 24 G Needle length: 9 cm Assessment Sensory level: T4

## 2019-11-25 NOTE — Transfer of Care (Signed)
Immediate Anesthesia Transfer of Care Note  Patient: Lori Cook  Procedure(s) Performed: LEFT TOTAL HIP ARTHROPLASTY ANTERIOR APPROACH (Left Hip)  Patient Location: PACU  Anesthesia Type:Spinal  Level of Consciousness: awake, alert  and oriented  Airway & Oxygen Therapy: Patient Spontanous Breathing  Post-op Assessment: Report given to RN and Post -op Vital signs reviewed and stable  Post vital signs: Reviewed and stable  Last Vitals:  Vitals Value Taken Time  BP 102/77 11/25/19 0922  Temp    Pulse 66 11/25/19 0924  Resp 18 11/25/19 0924  SpO2 100 % 11/25/19 0924  Vitals shown include unvalidated device data.  Last Pain:  Vitals:   11/25/19 0610  TempSrc: Oral  PainSc: 0-No pain         Complications: No apparent anesthesia complications

## 2019-11-25 NOTE — Discharge Summary (Signed)
Patient ID: Arzelia Harvin MRN: [id number] DOB/AGE: 68/06/1951 68 y.o.  Admit date: 11/25/2019 Discharge date: 11/25/2019  Admission Diagnoses:  Primary osteoarthritis of left hip  Discharge Diagnoses:  Principal Problem:   Primary osteoarthritis of left hip Active Problems:   Status post total replacement of left hip   Past Medical History:  Diagnosis Date  . Allergy   . Arthritis   . Blood transfusion without reported diagnosis   . Cholelithiasis    large stone detected by CT 01/2016  . GERD (gastroesophageal reflux disease)   . Hyperlipidemia   . Hypertension   . Nephrolithiasis    10mm kidney stone L by CT 01/2016  . 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 female who was admitted 11/25/2019 with a diagnosis of Primary osteoarthritis of left hip and went to the operating room on 11/25/2019 and underwent the above named procedures.    She was given perioperative antibiotics:  Anti-infectives (From admission, onward)   Start     Dose/Rate Route Frequency Ordered Stop   11/25/19 1400  ceFAZolin (ANCEF) IVPB 2g/100 mL premix     2 g 200 mL/hr over 30 Minutes Intravenous Every 6 hours 11/25/19 1040 11/26/19 0759   11/25/19 0802  vancomycin (VANCOCIN) powder  Status:  Discontinued       As needed 11/25/19 0802 11/25/19 0919   11/25/19 0600  ceFAZolin (ANCEF) IVPB 2g/100 mL premix     2 g 200 mL/hr over 30 Minutes Intravenous On call to O.R. 11/25/19 [id number] 11/25/19 0730    .  She was given sequential compression devices, early ambulation, and appropriate chemoprophylaxis for DVT prophylaxis.  She benefited maximally from the hospital stay and there were no complications.    Recent vital signs:  Vitals:   11/25/19 1033 11/25/19 1508    BP: 111/62 112/64  Pulse: 66 60  Resp: 18 16  Temp: 97.6 F (36.4 C) 97.6 F (36.4 C)  SpO2: 100% 100%    Recent laboratory studies:  Lab Results  Component Value Date   HGB 12.5 11/18/2019   HGB 13.3 08/12/2017   HGB 14.1 12/24/2016   Lab Results  Component Value Date   WBC 10.2 11/18/2019   PLT 267 11/18/2019   Lab Results  Component Value Date   INR 0.9 11/21/2019   Lab Results  Component Value Date   NA 141 11/18/2019   K 4.4 11/18/2019   CL 107 (H) 11/18/2019   CO2 21 11/18/2019   BUN 17 11/18/2019   CREATININE 0.76 11/18/2019   GLUCOSE 86 11/18/2019    Discharge Medications:   Allergies as of 11/25/2019      Reactions   Tramadol Nausea And Vomiting   Codeine Other (See Comments)   Headache      Medication List    TAKE these medications   acetaminophen 500 MG tablet Commonly known as: TYLENOL Take 1,000 mg by mouth every 6 (six) hours as needed (for pain.).   amLODipine 5 MG tablet Commonly known as: NORVASC Take 1 tablet (5 mg total) by mouth daily.   aspirin EC 81 MG tablet Take 1 tablet (81 mg total) by mouth 2 (two) times daily as needed.   ciprofloxacin-fluocinolone PF 0.3-0.025 % Soln Commonly known  as: OTOVEL Place 0.25 mLs into the right ear 2 (two) times daily.   famotidine 20 MG tablet Commonly known as: PEPCID Take 20 mg by mouth daily.   JUICE PLUS FIBRE PO Take 2 tablets by mouth daily.   methocarbamol 500 MG tablet Commonly known as: Robaxin Take 1 tablet (500 mg total) by mouth 2 (two) times daily as needed.   naproxen sodium 220 MG tablet Commonly known as: ALEVE Take 440 mg by mouth 2 (two) times daily as needed (pain.).   ondansetron 4 MG tablet Commonly known as: Zofran Take 1 tablet (4 mg total) by mouth every 8 (eight) hours as needed for nausea or vomiting.   OPTIVE 0.5-0.9 % ophthalmic solution Generic drug: carboxymethylcellul-glycerin Place 1-2 drops into both eyes 3 (three) times daily as needed for dry  eyes (dry/irritated eyes.).   oxyCODONE 10 mg 12 hr tablet Commonly known as: OXYCONTIN Take 1 tablet (10 mg total) by mouth every 12 (twelve) hours.   oxyCODONE-acetaminophen 5-325 MG tablet Commonly known as: Percocet Take 1-2 tablets by mouth every 8 (eight) hours as needed for severe pain.   polyethylene glycol 17 g packet Commonly known as: MiraLax Take 17 g by mouth daily as needed.   simvastatin 40 MG tablet Commonly known as: ZOCOR Take 1 tablet by mouth once daily with breakfast            Durable Medical Equipment  (From admission, onward)         Start     Ordered   11/25/19 1041  DME Walker rolling  Once    Question:  Patient needs a walker to treat with the following condition  Answer:  History of hip replacement   11/25/19 1040   11/25/19 1041  DME 3 n 1  Once     11/25/19 1040   11/25/19 1041  DME Bedside commode  Once    Question:  Patient needs a bedside commode to treat with the following condition  Answer:  History of hip replacement   11/25/19 1040          Diagnostic Studies: DG Chest 2 View  Result Date: 11/22/2019 CLINICAL DATA:  Preop left hip replacement EXAM: CHEST - 2 VIEW COMPARISON:  CT chest dated 08/29/2017 FINDINGS: Lungs are clear.  No pleural effusion or pneumothorax. The heart is normal in size. Mild degenerative changes of the visualized thoracolumbar spine. Cholecystectomy clips. IMPRESSION: Normal chest radiographs. Electronically Signed   By: Julian Hy M.D.   On: 11/22/2019 07:57   DG Pelvis Portable  Result Date: 11/25/2019 CLINICAL DATA:  Left hip arthroplasty EXAM: PORTABLE PELVIS 1-2 VIEWS COMPARISON:  11/25/2019 intraoperative imaging. FINDINGS: AP view of the pelvis demonstrates left hip arthroplasty, without acute hardware complication. Moderate right hip joint space narrowing. Sacroiliac joints are symmetric. No acute fracture or dislocation. IMPRESSION: Expected appearance after left hip arthroplasty.  Electronically Signed   By: Abigail Miyamoto M.D.   On: 11/25/2019 09:52   DG C-Arm 1-60 Min  Result Date: 11/25/2019 CLINICAL DATA:  Left hip arthroplasty. EXAM: OPERATIVE left HIP (WITH PELVIS IF PERFORMED) 2 VIEWS TECHNIQUE: Fluoroscopic spot image(s) were submitted for interpretation post-operatively. Radiation exposure index: 2.3755 mGy. COMPARISON:  None. FINDINGS: Two intraoperative fluoroscopic images of the left hip demonstrate the left acetabular and femoral components to be well situated. IMPRESSION: Status post left total hip arthroplasty. Electronically Signed   By: Marijo Conception M.D.   On: 11/25/2019 09:03   DG HIP OPERATIVE UNILAT  W OR W/O PELVIS LEFT  Result Date: 11/25/2019 CLINICAL DATA:  Left hip arthroplasty. EXAM: OPERATIVE left HIP (WITH PELVIS IF PERFORMED) 2 VIEWS TECHNIQUE: Fluoroscopic spot image(s) were submitted for interpretation post-operatively. Radiation exposure index: 2.3755 mGy. COMPARISON:  None. FINDINGS: Two intraoperative fluoroscopic images of the left hip demonstrate the left acetabular and femoral components to be well situated. IMPRESSION: Status post left total hip arthroplasty. Electronically Signed   By: Marijo Conception M.D.   On: 11/25/2019 09:03   XR HIP UNILAT W OR W/O PELVIS 2-3 VIEWS LEFT  Result Date: 11/05/2019 Severe left hip DJD.  XR HIP UNILAT W OR W/O PELVIS 2-3 VIEWS RIGHT  Result Date: 11/05/2019 Severe right hip DJD.  XR Lumbar Spine 2-3 Views  Result Date: 11/05/2019 Moderate L4-5 and L5-S1 degenerative disc disease.  Lumbar spondylosis.   Disposition: Discharge disposition: 01-Home or Self Care       Discharge Instructions    Call MD / Call 911   Complete by: As directed    If you experience chest pain or shortness of breath, CALL 911 and be transported to the hospital emergency room.  If you develope a fever above 101.5 F, pus (white drainage) or increased drainage or redness at the wound, or calf pain, call your surgeon's  office.   Constipation Prevention   Complete by: As directed    Drink plenty of fluids.  Prune juice may be helpful.  You may use a stool softener, such as Colace (over the counter) 100 mg twice a day.  Use MiraLax (over the counter) for constipation as needed.   Driving restrictions   Complete by: As directed    No driving while taking narcotic pain meds.   Increase activity slowly as tolerated   Complete by: As directed       Follow-up Information    Leandrew Koyanagi, MD In 2 weeks.   Specialty: Orthopedic Surgery Why: For suture removal, For wound re-check Contact information: [place] [id number] [phone]            Signed: Eduard Roux 11/25/2019, 4:37 PM

## 2019-11-25 NOTE — Anesthesia Procedure Notes (Signed)
Procedure Name: MAC Date/Time: 11/25/2019 7:20 AM Performed by: Kyung Rudd, CRNA Pre-anesthesia Checklist: Patient identified, Emergency Drugs available, Suction available and Patient being monitored Patient Re-evaluated:Patient Re-evaluated prior to induction Oxygen Delivery Method: Simple face mask Preoxygenation: Pre-oxygenation with 100% oxygen Induction Type: IV induction Placement Confirmation: positive ETCO2 Dental Injury: Teeth and Oropharynx as per pre-operative assessment

## 2019-11-25 NOTE — Care Plan (Signed)
RNCM call to patient on Friday, 11/22/19, to discuss her upcoming Left THA with Dr. Erlinda Hong on 11/25/19. Patient and Dr. Erlinda Hong have discussed possibility of being a same day discharge depending on how she does with surgery and therapy that day. Reviewed that patient has a CG that will be able to assist after surgery. She has all DME (FWW, elevated toilet seat, grabber, shoe horn, cane). No DME needed. Anticipate HHPT will be needed. Choice provided and referral made from office for [facility]. Will continue to be available if needs or changes to discharge plan arise. Jamse Arn, RN, BSN, [place] [phone]

## 2019-11-25 NOTE — Plan of Care (Signed)
  Problem: Education: Goal: Knowledge of General Education information will improve Description: Including pain rating scale, medication(s)/side effects and non-pharmacologic comfort measures Outcome: Progressing   Problem: Pain Managment: Goal: General experience of comfort will improve Outcome: Progressing   Problem: Safety: Goal: Ability to remain free from injury will improve Outcome: Progressing   

## 2019-11-25 NOTE — H&P (Signed)
PREOPERATIVE H&P  Chief Complaint: left hip degenerative joint disease  HPI: Lori Cook is a 69 y.o. female who presents for surgical treatment of left hip degenerative joint disease.  She denies any changes in medical history.  Past Medical History:  Diagnosis Date  . Allergy   . Arthritis   . Blood transfusion without reported diagnosis   . Cholelithiasis    large stone detected by CT 01/2016  . GERD (gastroesophageal reflux disease)   . Hyperlipidemia   . Hypertension   . Nephrolithiasis    24mm kidney stone L by CT 01/2016  . 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 ABDOMINAL HYSTERECTOMY    . APPENDECTOMY    . CHOLECYSTECTOMY    . COLONOSCOPY     [facility]- records purges and destroyed- per pt Normal   . SPINE SURGERY     Social History   Socioeconomic History  . Marital status: Single    Spouse name: Not on file  . Number of children: Not on file  . Years of education: Not on file  . Highest education level: Not on file  Occupational History  . Not on file  Tobacco Use  . Smoking status: Former Smoker    Quit date: 09/08/2010    Years since quitting: 9.2  . Smokeless tobacco: Never Used  Substance and Sexual Activity  . Alcohol use: No  . Drug use: No  . Sexual activity: Not on file  Other Topics Concern  . Not on file  Social History Narrative   Marital status: single; not dating; not interested in 2018     Children: 2 daughters, 1 son passed away 10.73 years old.  Two grandchildren (20,8)      Lives: with daughter, grandson.      Employment:  Education administrator business full time; one employee      Tobacco: quit smoking      Alcohol:  None      Exercise: no formal exercise      ADLs: independent with ADLs; drives; no assistant devices.       Advanced Directives: none in 2018.  FULL CODE; no prolonged measures. HCPOA: daughters   Social Determinants of  Health   Financial Resource Strain:   . Difficulty of Paying Living Expenses:   Food Insecurity:   . Worried About Charity fundraiser in the Last Year:   . Arboriculturist in the Last Year:   Transportation Needs:   . Film/video editor (Medical):   Marland Kitchen Lack of Transportation (Non-Medical):   Physical Activity:   . Days of Exercise per Week:   . Minutes of Exercise per Session:   Stress:   . Feeling of Stress :   Social Connections:   . Frequency of Communication with Friends and Family:   . Frequency of Social Gatherings with Friends and Family:   . Attends Religious Services:   . Active Member of Clubs or Organizations:   . Attends Archivist Meetings:   Marland Kitchen Marital Status:    Family History  Problem Relation Age of Onset  . Stroke Father   . Diabetes Father   . Heart disease Father 53       AMI  . Hypertension Sister   . Arthritis Brother   . Mental illness Mother   . Heart disease Paternal Uncle   .  Colon cancer Neg Hx   . Colon polyps Neg Hx   . Esophageal cancer Neg Hx   . Rectal cancer Neg Hx   . Stomach cancer Neg Hx    Allergies  Allergen Reactions  . Tramadol Nausea And Vomiting  . Codeine Other (See Comments)    Headache   Prior to Admission medications   Medication Sig Start Date End Date Taking? Authorizing Provider  acetaminophen (TYLENOL) 500 MG tablet Take 1,000 mg by mouth every 6 (six) hours as needed (for pain.).   Yes [provider]  amLODipine (NORVASC) 5 MG tablet Take 1 tablet (5 mg total) by mouth daily. 10/23/19  Yes Stallings, Zoe A, MD  carboxymethylcellul-glycerin (OPTIVE) 0.5-0.9 % ophthalmic solution Place 1-2 drops into both eyes 3 (three) times daily as needed for dry eyes (dry/irritated eyes.).   Yes [provider]  famotidine (PEPCID) 20 MG tablet Take 20 mg by mouth daily.    Yes [provider]  naproxen sodium (ALEVE) 220 MG tablet Take 440 mg by mouth 2 (two) times daily as needed (pain.).    Yes [provider]  Nutritional Supplements (JUICE PLUS FIBRE PO) Take 2 tablets by mouth daily.    Yes [provider]  simvastatin (ZOCOR) 40 MG tablet Take 1 tablet by mouth once daily with breakfast 10/23/19  Yes Delia Chimes A, MD  aspirin EC 81 MG tablet Take 1 tablet (81 mg total) by mouth 2 (two) times daily as needed. 11/20/19   Aundra Dubin, PA-C  ciprofloxacin-fluocinolone PF (OTOVEL) 0.3-0.025 % SOLN Place 0.25 mLs into the right ear 2 (two) times daily. 09/03/19   Maximiano Coss, NP  methocarbamol (ROBAXIN) 500 MG tablet Take 1 tablet (500 mg total) by mouth 2 (two) times daily as needed. 11/20/19   Aundra Dubin, PA-C  ondansetron (ZOFRAN) 4 MG tablet Take 1 tablet (4 mg total) by mouth every 8 (eight) hours as needed for nausea or vomiting. 11/20/19   Aundra Dubin, PA-C  oxyCODONE (OXYCONTIN) 10 mg 12 hr tablet Take 1 tablet (10 mg total) by mouth every 12 (twelve) hours. 11/22/19   Leandrew Koyanagi, MD  oxyCODONE-acetaminophen (PERCOCET) 5-325 MG tablet Take 1-2 tablets by mouth every 8 (eight) hours as needed for severe pain. 11/22/19   Leandrew Koyanagi, MD  polyethylene glycol (MIRALAX) 17 g packet Take 17 g by mouth daily as needed. 11/20/19   Aundra Dubin, PA-C     Positive ROS: All other systems have been reviewed and were otherwise negative with the exception of those mentioned in the HPI and as above.  Physical Exam: General: Alert, no acute distress Cardiovascular: No pedal edema Respiratory: No cyanosis, no use of accessory musculature GI: abdomen soft Skin: No lesions in the area of chief complaint Neurologic: Sensation intact distally Psychiatric: Patient is competent for consent with normal mood and affect Lymphatic: no lymphedema  MUSCULOSKELETAL: exam stable  Assessment: left hip degenerative joint disease  Plan: Plan for Procedure(s): LEFT TOTAL HIP ARTHROPLASTY ANTERIOR APPROACH  The risks benefits and alternatives were  discussed with the patient including but not limited to the risks of nonoperative treatment, versus surgical intervention including infection, bleeding, nerve injury,  blood clots, cardiopulmonary complications, morbidity, mortality, among others, and they were willing to proceed.   Preoperative templating of the joint replacement has been completed, documented, and submitted to the Operating Room personnel in order to optimize intra-operative equipment management.  Eduard Roux, MD   11/25/2019 7:04  AM

## 2019-11-25 NOTE — Discharge Instructions (Signed)

## 2019-11-26 ENCOUNTER — Encounter: Payer: Self-pay | Admitting: *Deleted

## 2019-11-26 LAB — BPAM RBC
Blood Product Expiration Date: 202104142359
Blood Product Expiration Date: 202104142359
Unit Type and Rh: 5100
Unit Type and Rh: 5100

## 2019-11-26 LAB — TYPE AND SCREEN
ABO/RH(D): O POS
Antibody Screen: POSITIVE
Donor AG Type: NEGATIVE
Donor AG Type: NEGATIVE
PT AG Type: NEGATIVE
Unit division: 0
Unit division: 0

## 2019-11-26 NOTE — Anesthesia Postprocedure Evaluation (Signed)
Anesthesia Post Note  Patient: Lori Cook  Procedure(s) Performed: LEFT TOTAL HIP ARTHROPLASTY ANTERIOR APPROACH (Left Hip)     Patient location during evaluation: PACU Anesthesia Type: MAC Level of consciousness: awake and alert Pain management: pain level controlled Vital Signs Assessment: post-procedure vital signs reviewed and stable Respiratory status: spontaneous breathing, nonlabored ventilation, respiratory function stable and patient connected to nasal cannula oxygen Cardiovascular status: stable and blood pressure returned to baseline Postop Assessment: no apparent nausea or vomiting Anesthetic complications: no    Last Vitals:  Vitals:   11/25/19 1033 11/25/19 1508  BP: 111/62 112/64  Pulse: 66 60  Resp: 18 16  Temp: 36.4 C 36.4 C  SpO2: 100% 100%    Last Pain:  Vitals:   11/25/19 1508  TempSrc: Oral  PainSc:    Pain Goal: Patients Stated Pain Goal: 3 (11/25/19 0933)                 Rafal Archuleta

## 2019-11-27 ENCOUNTER — Telehealth: Payer: Self-pay | Admitting: Orthopaedic Surgery

## 2019-11-27 NOTE — Telephone Encounter (Signed)
Patient called and stated she has not heard from [facility]. She wants to know how long does that take.  Please call patient [phone]

## 2019-11-28 DIAGNOSIS — K219 Gastro-esophageal reflux disease without esophagitis: Secondary | ICD-10-CM | POA: Diagnosis not present

## 2019-11-28 DIAGNOSIS — Z87891 Personal history of nicotine dependence: Secondary | ICD-10-CM | POA: Diagnosis not present

## 2019-11-28 DIAGNOSIS — E785 Hyperlipidemia, unspecified: Secondary | ICD-10-CM | POA: Diagnosis not present

## 2019-11-28 DIAGNOSIS — H349 Unspecified retinal vascular occlusion: Secondary | ICD-10-CM | POA: Diagnosis not present

## 2019-11-28 DIAGNOSIS — I10 Essential (primary) hypertension: Secondary | ICD-10-CM | POA: Diagnosis not present

## 2019-11-28 DIAGNOSIS — Z471 Aftercare following joint replacement surgery: Secondary | ICD-10-CM | POA: Diagnosis not present

## 2019-11-28 DIAGNOSIS — Z96642 Presence of left artificial hip joint: Secondary | ICD-10-CM | POA: Diagnosis not present

## 2019-11-28 NOTE — Telephone Encounter (Signed)
Patient had SU 11/25/2019. I called Sonia Side to see what the hold up was. He said she is in the system and will call patient now. Faxed info to [facility] since 11/20/2019. He will call us back.

## 2019-12-03 ENCOUNTER — Telehealth: Payer: Self-pay | Admitting: Orthopaedic Surgery

## 2019-12-03 ENCOUNTER — Other Ambulatory Visit: Payer: Self-pay | Admitting: Physician Assistant

## 2019-12-03 MED ORDER — METHOCARBAMOL 500 MG PO TABS: 500.0000 mg | ORAL_TABLET | Freq: Two times a day (BID) | ORAL | 0 refills | Status: DC | PRN

## 2019-12-03 NOTE — Telephone Encounter (Signed)
Patient called.   She is requesting a refill on her muscle relaxer and that it be sent to [facility] on [address].    Call back: [phone]

## 2019-12-03 NOTE — Telephone Encounter (Signed)
Sent in

## 2019-12-10 ENCOUNTER — Encounter: Payer: Self-pay | Admitting: Physician Assistant

## 2019-12-10 ENCOUNTER — Other Ambulatory Visit: Payer: Self-pay

## 2019-12-10 ENCOUNTER — Ambulatory Visit: Payer: PPO | Admitting: Physician Assistant

## 2019-12-10 ENCOUNTER — Ambulatory Visit (INDEPENDENT_AMBULATORY_CARE_PROVIDER_SITE_OTHER): Payer: PPO | Admitting: Physician Assistant

## 2019-12-10 DIAGNOSIS — Z96642 Presence of left artificial hip joint: Secondary | ICD-10-CM

## 2019-12-10 NOTE — Progress Notes (Signed)
Post-Op Visit Note   Patient: Lori Cook           Date of Birth: [birth year]           MRN: [id number] Visit Date: 12/10/2019 PCP: Forrest Moron, MD   Assessment & Plan:  Chief Complaint:  Chief Complaint  Patient presents with  . Left Hip - Pain   Visit Diagnoses:  1. H/O total hip arthroplasty, left     Plan: Patient is a pleasant 69 year old female who comes in today 2 weeks out left anterior total hip replacement 11/25/2019.  She has been doing well.  She has recently finished home health PT and is ambulating with a cane.  Minimal pain which is relieved with Tylenol and occasional Robaxin.  No fevers or chills.  Examination of her left hip reveals a well-healing surgical incision with nylon sutures in place.  No evidence of infection or cellulitis.  Today, nylon sutures were removed Steri-Strips applied.  She will continue with her home exercise program.  Follow-up with [place] in 4 weeks time for repeat evaluation and AP pelvis lateral left hip x-rays.  Dental prophylaxis reinforced.  Follow-Up Instructions: Return in about 4 weeks (around 01/07/2020).   Orders:  No orders of the defined types were placed in this encounter.  No orders of the defined types were placed in this encounter.   Imaging: No new imaging  PMFS History: Patient Active Problem List   Diagnosis Date Noted  . Primary osteoarthritis of left hip 11/25/2019  . Status post total replacement of left hip 11/25/2019  . Calculus of gallbladder without cholecystitis without obstruction 05/07/2016  . Nephrolithiasis 05/07/2016  . Pulmonary nodules 05/07/2016  . Hematuria, microscopic 05/07/2016  . Essential hypertension, benign 04/24/2015  . Allergic rhinitis due to pollen 04/24/2015  . Retinal vein occlusion, branch 04/24/2015  . Retinal vein occlusion 03/08/2015  . Essential hypertension-new onset 03/08/2015  . Hyperlipidemia with target LDL less than 130 09/09/2012   Past Medical History:   Diagnosis Date  . Allergy   . Arthritis   . Blood transfusion without reported diagnosis   . Cholelithiasis    large stone detected by CT 01/2016  . GERD (gastroesophageal reflux disease)   . Hyperlipidemia   . Hypertension   . Nephrolithiasis    24mm kidney stone L by CT 01/2016  . 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 Stroke Father   . Diabetes Father   . Heart disease Father 81       AMI  . Hypertension Sister   . Arthritis Brother   . Mental illness Mother   . Heart disease Paternal Uncle   . Colon cancer Neg Hx   . Colon polyps Neg Hx   . Esophageal cancer Neg Hx   . Rectal cancer Neg Hx   . Stomach cancer Neg Hx     Past Surgical History:  Procedure Laterality Date  . ABDOMINAL HYSTERECTOMY    . APPENDECTOMY    . CHOLECYSTECTOMY    . COLONOSCOPY     [facility]- records purges and destroyed- per pt Normal   . SPINE SURGERY    . 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   Procedure: LEFT TOTAL HIP ARTHROPLASTY ANTERIOR APPROACH;  Surgeon: Leandrew Koyanagi, MD;  Location: [facility];  Service: Orthopedics;  Laterality: Left;   Social History  Occupational History  . Not on file  Tobacco Use  . Smoking status: Former Smoker    Quit date: 09/08/2010    Years since quitting: 9.2  . Smokeless tobacco: Never Used  Substance and Sexual Activity  . Alcohol use: No  . Drug use: No  . Sexual activity: Not on file

## 2019-12-23 ENCOUNTER — Ambulatory Visit (INDEPENDENT_AMBULATORY_CARE_PROVIDER_SITE_OTHER): Payer: PPO | Admitting: Family Medicine

## 2019-12-23 VITALS — BP 112/64 | Ht 66.0 in | Wt 152.0 lb

## 2019-12-23 DIAGNOSIS — Z0000 Encounter for general adult medical examination without abnormal findings: Secondary | ICD-10-CM

## 2019-12-23 NOTE — Patient Instructions (Signed)
Thank you for taking time to come for your Medicare Wellness Visit. I appreciate your ongoing commitment to your health goals. Please review the following plan we discussed and let me know if I can assist you in the future.  Afiya Ferrebee LPN Preventive Care 68 Years and Older, Female Preventive care refers to lifestyle choices and visits with your health care provider that can promote health and wellness. This includes:  A yearly physical exam. This is also called an annual well check.  Regular dental and eye exams.  Immunizations.  Screening for certain conditions.  Healthy lifestyle choices, such as diet and exercise. What can I expect for my preventive care visit? Physical exam Your health care provider will check:  Height and weight. These may be used to calculate body mass index (BMI), which is a measurement that tells if you are at a healthy weight.  Heart rate and blood pressure.  Your skin for abnormal spots. Counseling Your health care provider may ask you questions about:  Alcohol, tobacco, and drug use.  Emotional well-being.  Home and relationship well-being.  Sexual activity.  Eating habits.  History of falls.  Memory and ability to understand (cognition).  Work and work environment.  Pregnancy and menstrual history. What immunizations do I need?  Influenza (flu) vaccine  This is recommended every year. Tetanus, diphtheria, and pertussis (Tdap) vaccine  You may need a Td booster every 10 years. Varicella (chickenpox) vaccine  You may need this vaccine if you have not already been vaccinated. Zoster (shingles) vaccine  You may need this after age 60. Pneumococcal conjugate (PCV13) vaccine  One dose is recommended after age 68. Pneumococcal polysaccharide (PPSV23) vaccine  One dose is recommended after age 68. Measles, mumps, and rubella (MMR) vaccine  You may need at least one dose of MMR if you were born in 1957 or later. You may also need  a second dose. Meningococcal conjugate (MenACWY) vaccine  You may need this if you have certain conditions. Hepatitis A vaccine  You may need this if you have certain conditions or if you travel or work in places where you may be exposed to hepatitis A. Hepatitis B vaccine  You may need this if you have certain conditions or if you travel or work in places where you may be exposed to hepatitis B. Haemophilus influenzae type b (Hib) vaccine  You may need this if you have certain conditions. You may receive vaccines as individual doses or as more than one vaccine together in one shot (combination vaccines). Talk with your health care provider about the risks and benefits of combination vaccines. What tests do I need? Blood tests  Lipid and cholesterol levels. These may be checked every 5 years, or more frequently depending on your overall health.  Hepatitis C test.  Hepatitis B test. Screening  Lung cancer screening. You may have this screening every year starting at age 55 if you have a 30-pack-year history of smoking and currently smoke or have quit within the past 15 years.  Colorectal cancer screening. All adults should have this screening starting at age 50 and continuing until age 75. Your health care provider may recommend screening at age 45 if you are at increased risk. You will have tests every 1-10 years, depending on your results and the type of screening test.  Diabetes screening. This is done by checking your blood sugar (glucose) after you have not eaten for a while (fasting). You may have this done every 1-3 years.    Mammogram. This may be done every 1-2 years. Talk with your health care provider about how often you should have regular mammograms.  BRCA-related cancer screening. This may be done if you have a family history of breast, ovarian, tubal, or peritoneal cancers. Other tests  Sexually transmitted disease (STD) testing.  Bone density scan. This is done to  screen for osteoporosis. You may have this done starting at age 31. Follow these instructions at home: Eating and drinking  Eat a diet that includes fresh fruits and vegetables, whole grains, lean protein, and low-fat dairy products. Limit your intake of foods with high amounts of sugar, saturated fats, and salt.  Take vitamin and mineral supplements as recommended by your health care provider.  Do not drink alcohol if your health care provider tells you not to drink.  If you drink alcohol: ? Limit how much you have to 0-1 drink a day. ? Be aware of how much alcohol is in your drink. In the [place] one drink equals one 12 oz bottle of beer (355 mL), one 5 oz glass of wine (148 mL), or one 1 oz glass of hard liquor (44 mL). Lifestyle  Take daily care of your teeth and gums.  Stay active. Exercise for at least 30 minutes on 5 or more days each week.  Do not use any products that contain nicotine or tobacco, such as cigarettes, e-cigarettes, and chewing tobacco. If you need help quitting, ask your health care provider.  If you are sexually active, practice safe sex. Use a condom or other form of protection in order to prevent STIs (sexually transmitted infections).  Talk with your health care provider about taking a low-dose aspirin or statin. What's next?  Go to your health care provider once a year for a well check visit.  Ask your health care provider how often you should have your eyes and teeth checked.  Stay up to date on all vaccines. This information is not intended to replace advice given to you by your health care provider. Make sure you discuss any questions you have with your health care provider. Document Revised: 08/23/2018 Document Reviewed: 08/23/2018 Elsevier Patient Education  2020 [employer].

## 2019-12-23 NOTE — Progress Notes (Signed)
Presents today for [employer] Visit   Date of last exam: 3/8/20201  Interpreter used for this visit? No  I connected with  Lori Cook on 12/23/19 by a telephone  and verified that I am speaking with the correct person using two identifiers.   I discussed the limitations of evaluation and management by telemedicine. The patient expressed understanding and agreed to proceed.   Patient Care Team: Forrest Moron, MD as PCP - General (Internal Medicine)   Other items to address today:   Discussed Eye Dental Discussed Immunizations AVS mailed    Other Screening: Last screening for diabetes: 10/23/2019 Last lipid screening: 10/23/2019  ADVANCE DIRECTIVES: Discussed: yes On File: no Materials Provided: no  Immunization status:  Immunization History  Administered Date(s) Administered  . Fluad Quad(high Dose 68+) 06/21/2019  . Influenza Split 07/28/2015  . Influenza, Seasonal, Injecte, Preservative Fre 09/08/2012, 09/19/2013  . Influenza,inj,Quad PF,6+ Mos 05/07/2016, 07/22/2017  . Influenza-Unspecified 06/26/2019  . PFIZER SARS-COV-2 Vaccination 10/27/2019, 11/19/2019  . Pneumococcal Conjugate-13 08/12/2017  . Pneumococcal Polysaccharide-23 11/09/2013, 11/21/2018  . Tdap 11/09/2013  . Zoster 09/12/2013  . Zoster Recombinat (Shingrix) 06/28/2018, 08/30/2018     There are no preventive care reminders to display for this patient.   Functional Status Survey: Is the patient deaf or have difficulty hearing?: No Does the patient have difficulty seeing, even when wearing glasses/contacts?: No Does the patient have difficulty concentrating, remembering, or making decisions?: No Does the patient have difficulty walking or climbing stairs?: No Does the patient have difficulty dressing or bathing?: No Does the patient have difficulty doing errands alone such as visiting a doctor's office or shopping?: No   6CIT Screen 12/23/2019 11/21/2018  What  Year? 0 points -  What month? 0 points 0 points  What time? 0 points -  Count back from 20 0 points 0 points  Months in reverse 0 points 0 points  Repeat phrase 0 points 0 points  Total Score 0 -        Clinical Support from 12/23/2019 in [facility]  AUDIT-C Score  0       Home Environment:    Lives in a one story home No trouble climbing stairs  (patient is on week 4 from hip surgery) Yes grab bars No scattered rugs Adequate lighting/ no clutter      Patient Active Problem List   Diagnosis Date Noted  . Primary osteoarthritis of left hip 11/25/2019  . Status post total replacement of left hip 11/25/2019  . Calculus of gallbladder without cholecystitis without obstruction 05/07/2016  . Nephrolithiasis 05/07/2016  . Pulmonary nodules 05/07/2016  . Hematuria, microscopic 05/07/2016  . Essential hypertension, benign 04/24/2015  . Allergic rhinitis due to pollen 04/24/2015  . Retinal vein occlusion, branch 04/24/2015  . Retinal vein occlusion 03/08/2015  . Essential hypertension-new onset 03/08/2015  . Hyperlipidemia with target LDL less than 130 09/09/2012     Past Medical History:  Diagnosis Date  . Allergy   . Arthritis   . Blood transfusion without reported diagnosis   . Cholelithiasis    large stone detected by CT 01/2016  . GERD (gastroesophageal reflux disease)   . Hyperlipidemia   . Hypertension   . Nephrolithiasis    55mm kidney stone L by CT 01/2016  . 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  ABDOMINAL HYSTERECTOMY    . APPENDECTOMY    . CHOLECYSTECTOMY    . COLONOSCOPY     [facility]- records purges and destroyed- per pt Normal   . SPINE SURGERY    . 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   Procedure: LEFT TOTAL HIP ARTHROPLASTY ANTERIOR APPROACH;  Surgeon: Leandrew Koyanagi, MD;  Location: [facility];  Service: Orthopedics;  Laterality: Left;      Family History  Problem Relation Age of Onset  . Stroke Father   . Diabetes Father   . Heart disease Father 41       AMI  . Hypertension Sister   . Arthritis Brother   . Mental illness Mother   . Heart disease Paternal Uncle   . Colon cancer Neg Hx   . Colon polyps Neg Hx   . Esophageal cancer Neg Hx   . Rectal cancer Neg Hx   . Stomach cancer Neg Hx      Social History   Socioeconomic History  . Marital status: Single    Spouse name: Not on file  . Number of children: Not on file  . Years of education: Not on file  . Highest education level: Not on file  Occupational History  . Not on file  Tobacco Use  . Smoking status: Former Smoker    Quit date: 09/08/2010    Years since quitting: 68  . Smokeless tobacco: Never Used  Substance and Sexual Activity  . Alcohol use: No  . Drug use: No  . Sexual activity: Not on file  Other Topics Concern  . Not on file  Social History Narrative   Marital status: single; not dating; not interested in 2018     Children: 2 daughters, 1 son passed away 67.23 years old.  Two grandchildren (20,8)      Lives: with daughter, grandson.      Employment:  Education administrator business full time; one employee      Tobacco: quit smoking      Alcohol:  None      Exercise: no formal exercise      ADLs: independent with ADLs; drives; no assistant devices.       Advanced Directives: none in 2018.  FULL CODE; no prolonged measures. HCPOA: daughters   Social Determinants of Health   Financial Resource Strain:   . Difficulty of Paying Living Expenses:   Food Insecurity:   . Worried About Charity fundraiser in the Last Year:   . Arboriculturist in the Last Year:   Transportation Needs:   . Film/video editor (Medical):   Marland Kitchen Lack of Transportation (Non-Medical):   Physical Activity:   . Days of Exercise per Week:   . Minutes of Exercise per Session:   Stress:   . Feeling of Stress :   Social Connections:   . Frequency of Communication with  Friends and Family:   . Frequency of Social Gatherings with Friends and Family:   . Attends Religious Services:   . Active Member of Clubs or Organizations:   . Attends Archivist Meetings:   Marland Kitchen Marital Status:   Intimate Partner Violence:   . Fear of Current or Ex-Partner:   . Emotionally Abused:   Marland Kitchen Physically Abused:   . Sexually Abused:      Allergies  Allergen Reactions  . Tramadol Nausea And Vomiting  . Codeine Other (See Comments)    Headache     Prior to Admission medications  Medication Sig Start Date End Date Taking? Authorizing Provider  acetaminophen (TYLENOL) 500 MG tablet Take 1,000 mg by mouth every 6 (six) hours as needed (for pain.).   Yes [provider]  amLODipine (NORVASC) 5 MG tablet Take 1 tablet (5 mg total) by mouth daily. 10/23/19  Yes Forrest Moron, MD  aspirin EC 81 MG tablet Take 1 tablet (81 mg total) by mouth 2 (two) times daily as needed. 11/20/19  Yes Aundra Dubin, PA-C  carboxymethylcellul-glycerin (OPTIVE) 0.5-0.9 % ophthalmic solution Place 1-2 drops into both eyes 3 (three) times daily as needed for dry eyes (dry/irritated eyes.).   Yes [provider]  ciprofloxacin-fluocinolone PF (OTOVEL) 0.3-0.025 % SOLN Place 0.25 mLs into the right ear 2 (two) times daily. 09/03/19  Yes Maximiano Coss, NP  famotidine (PEPCID) 20 MG tablet Take 20 mg by mouth daily.    Yes [provider]  naproxen sodium (ALEVE) 220 MG tablet Take 440 mg by mouth 2 (two) times daily as needed (pain.).   Yes [provider]  Nutritional Supplements (JUICE PLUS FIBRE PO) Take 2 tablets by mouth daily.    Yes [provider]  simvastatin (ZOCOR) 40 MG tablet Take 1 tablet by mouth once daily with breakfast 10/23/19  Yes Stallings, Zoe A, MD  methocarbamol (ROBAXIN) 500 MG tablet Take 1 tablet (500 mg total) by mouth 2 (two) times daily as needed. Patient not taking: Reported on 12/23/2019 12/03/19   Aundra Dubin,  PA-C  ondansetron (ZOFRAN) 4 MG tablet Take 1 tablet (4 mg total) by mouth every 8 (eight) hours as needed for nausea or vomiting. 11/20/19   Aundra Dubin, PA-C  oxyCODONE (OXYCONTIN) 10 mg 12 hr tablet Take 1 tablet (10 mg total) by mouth every 12 (twelve) hours. 11/22/19   Leandrew Koyanagi, MD  oxyCODONE-acetaminophen (PERCOCET) 5-325 MG tablet Take 1-2 tablets by mouth every 8 (eight) hours as needed for severe pain. 11/22/19   Leandrew Koyanagi, MD  polyethylene glycol (MIRALAX) 17 g packet Take 17 g by mouth daily as needed. 11/20/19   Aundra Dubin, PA-C     Depression screen [facility] 2/9 12/23/2019 11/18/2019 10/23/2019 09/03/2019 08/29/2018  Decreased Interest 0 0 0 0 0  Down, Depressed, Hopeless 0 0 0 0 0  PHQ - 2 Score 0 0 0 0 0     Fall Risk  12/23/2019 11/18/2019 10/23/2019 09/03/2019 11/21/2018  Falls in the past year? 0 0 0 0 0  Number falls in past yr: 0 0 0 0 0  Injury with Fall? 0 0 0 0 0  Follow up - Falls evaluation completed Falls evaluation completed Falls evaluation completed -      PHYSICAL EXAM: BP 112/64 Comment: taken from previous visit  Ht 5\' 6"  (1.676 m)   Wt 152 lb (68.9 kg)   BMI 24.53 kg/m    Wt Readings from Last 3 Encounters:  12/23/19 152 lb (68.9 kg)  11/25/19 152 lb 9.6 oz (69.2 kg)  11/21/19 152 lb 9.6 oz (69.2 kg)      Education/Counseling provided regarding diet and exercise, prevention of chronic diseases, smoking/tobacco cessation, if applicable, and reviewed "Covered Medicare Preventive Services."

## 2020-01-03 ENCOUNTER — Other Ambulatory Visit: Payer: Self-pay

## 2020-01-07 ENCOUNTER — Ambulatory Visit (INDEPENDENT_AMBULATORY_CARE_PROVIDER_SITE_OTHER): Payer: PPO

## 2020-01-07 ENCOUNTER — Ambulatory Visit: Payer: PPO | Admitting: Internal Medicine

## 2020-01-07 ENCOUNTER — Other Ambulatory Visit: Payer: Self-pay

## 2020-01-07 ENCOUNTER — Ambulatory Visit: Payer: Self-pay

## 2020-01-07 ENCOUNTER — Encounter: Payer: Self-pay | Admitting: Internal Medicine

## 2020-01-07 ENCOUNTER — Ambulatory Visit (INDEPENDENT_AMBULATORY_CARE_PROVIDER_SITE_OTHER): Payer: PPO | Admitting: Orthopaedic Surgery

## 2020-01-07 ENCOUNTER — Encounter: Payer: Self-pay | Admitting: Orthopaedic Surgery

## 2020-01-07 DIAGNOSIS — M25551 Pain in right hip: Secondary | ICD-10-CM

## 2020-01-07 DIAGNOSIS — M810 Age-related osteoporosis without current pathological fracture: Secondary | ICD-10-CM | POA: Diagnosis not present

## 2020-01-07 DIAGNOSIS — E559 Vitamin D deficiency, unspecified: Secondary | ICD-10-CM | POA: Diagnosis not present

## 2020-01-07 DIAGNOSIS — Z96642 Presence of left artificial hip joint: Secondary | ICD-10-CM

## 2020-01-07 DIAGNOSIS — M1611 Unilateral primary osteoarthritis, right hip: Secondary | ICD-10-CM | POA: Diagnosis not present

## 2020-01-07 MED ORDER — ALENDRONATE SODIUM 70 MG PO TABS: 70.0000 mg | ORAL_TABLET | ORAL | 6 refills | Status: DC

## 2020-01-07 NOTE — Progress Notes (Signed)
Subjective: Patient is here for ultrasound-guided intra-articular right hip injection.   Trying to postpone replacement for a while.  Objective:  Pain and decreased ROM with IR.  Procedure: Ultrasound-guided right hip injection: After sterile prep with Betadine, injected 8 cc 1% lidocaine without epinephrine and 40 mg methylprednisolone using a 22-gauge spinal needle, passing the needle through the iliofemoral ligament into the femoral head/neck junction.  Injectate seen filling joint capsule.

## 2020-01-07 NOTE — Progress Notes (Signed)
Office Visit Note   Patient: Lori Cook           Date of Birth: 1951-07-18           MRN: [id number] Visit Date: 01/07/2020              Requested by: Forrest Moron, MD [place],  [place] [id number] PCP: Forrest Moron, MD   Assessment & Plan: Visit Diagnoses:  1. H/O total hip arthroplasty, left   2. Unilateral primary osteoarthritis, right hip     Plan: Impression is 6 weeks status post left hip replacement and advanced degenerative joint disease to the right hip.  In regards to the left hip, she will continue to increase activity as tolerated.  She will follow up with [place] in 6 weeks time for repeat evaluation.  Dental prophylaxis reinforced.  In regards to the right hip, we have referred her to Dr. Junius Roads for an ultrasound-guided cortisone injection.  She will follow up with [place] approximately 4 to 6 weeks prior to anticipated surgical intervention to further discuss this.  Call with concerns or questions in the meantime.  Follow-Up Instructions: Return in about 6 weeks (around 02/18/2020).   Orders:  Orders Placed This Encounter  Procedures  . XR HIP UNILAT W OR W/O PELVIS 2-3 VIEWS LEFT   No orders of the defined types were placed in this encounter.     Procedures: No procedures performed   Clinical Data: No additional findings.   Subjective: Chief Complaint  Patient presents with  . Left Hip - Follow-up    HPI patient is a pleasant 69 year old female who comes in today 6 weeks out left anterior total hip replacement.  She has been doing very well.  Minimal to no pain.  Her main issue is her right hip.  History of advanced degenerative changes to the right hip joint.  The pain she is having is primarily to the groin and worse with flexion of the hip.  She has not previously had a cortisone injection.  She is trying to wait about 6 months from now before proceeding with right total hip replacement.  Review of Systems as detailed in HPI.  All others  reviewed and are negative.   Objective: Vital Signs: There were no vitals taken for this visit.  Physical Exam well-developed well-nourished female in no acute distress.  Alert and oriented x3.  Ortho Exam examination of the left hip reveals negative logroll.  Great range of motion and strength.  Examination of the right hip reveals a positive logroll with very little internal rotation.  She is neurovascular intact distally.  Specialty Comments:  No specialty comments available.  Imaging: XR HIP UNILAT W OR W/O PELVIS 2-3 VIEWS LEFT  Result Date: 01/07/2020 X-rays demonstrate a well-seated prosthesis without complication    PMFS History: Patient Active Problem List   Diagnosis Date Noted  . Primary osteoarthritis of left hip 11/25/2019  . Status post total replacement of left hip 11/25/2019  . Calculus of gallbladder without cholecystitis without obstruction 05/07/2016  . Nephrolithiasis 05/07/2016  . Pulmonary nodules 05/07/2016  . Hematuria, microscopic 05/07/2016  . Essential hypertension, benign 04/24/2015  . Allergic rhinitis due to pollen 04/24/2015  . Retinal vein occlusion, branch 04/24/2015  . Retinal vein occlusion 03/08/2015  . Essential hypertension-new onset 03/08/2015  . Hyperlipidemia with target LDL less than 130 09/09/2012   Past Medical History:  Diagnosis Date  . Allergy   . Arthritis   .  Blood transfusion without reported diagnosis   . Cholelithiasis    large stone detected by CT 01/2016  . GERD (gastroesophageal reflux disease)   . Hyperlipidemia   . Hypertension   . Nephrolithiasis    10mm kidney stone L by CT 01/2016  . 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 Stroke Father   . Diabetes Father   . Heart disease Father 50       AMI  . Hypertension Sister   . Arthritis Brother   . Mental illness Mother   . Heart disease Paternal Uncle   . Colon cancer  Neg Hx   . Colon polyps Neg Hx   . Esophageal cancer Neg Hx   . Rectal cancer Neg Hx   . Stomach cancer Neg Hx     Past Surgical History:  Procedure Laterality Date  . ABDOMINAL HYSTERECTOMY    . APPENDECTOMY    . CHOLECYSTECTOMY    . COLONOSCOPY     [facility]- records purges and destroyed- per pt Normal   . SPINE SURGERY    . 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   Procedure: LEFT TOTAL HIP ARTHROPLASTY ANTERIOR APPROACH;  Surgeon: Leandrew Koyanagi, MD;  Location: [facility];  Service: Orthopedics;  Laterality: Left;   Social History   Occupational History  . Not on file  Tobacco Use  . Smoking status: Former Smoker    Quit date: 09/08/2010    Years since quitting: 9.3  . Smokeless tobacco: Never Used  Substance and Sexual Activity  . Alcohol use: No  . Drug use: No  . Sexual activity: Not on file

## 2020-01-07 NOTE — Progress Notes (Signed)
Name: Lori Cook  MRN/ DOB: [id number], [birth year]    Age/ Sex: 69 y.o., female    PCP: Forrest Moron, MD   Reason for Endocrinology Evaluation: Hypercalcemia      Date of Initial Endocrinology Evaluation: 01/08/2020     HPI: [name] is a 69 y.o. female with a past medical history of Osteopenia and dyslipidemia.  The patient presented for initial endocrinology clinic visit on 01/08/2020 for consultative assistance with her Osteopenia       Lori Cook indicates that she was first noted to have intermittent hypercalcemia in 2016 with serum calcium os 10.5 mg/dL ( Corrected calcium 9.78) and again in 2019 , serum calcium 10.4 ( corrected 9.84 mg/dL ) and in 10/2019 at 10.8 ( Corrected 10.32).  Since that time, she denies experienced symptoms of constipation,  polydipsia, generalized weakness, diffuse muscle pains, significant memory impairment. She denies use of over the counter calcium (including supplements, Tums, Rolaids, or other calcium containing antacids), lithium. She was on HCTZ from 2016 until 10/2019 when it was switched to Amlodipine.   She is not on  vitamin D supplements.   She has a history of kidney stones ( 1x) , denies kidney disease, liver disease, granulomatous disease. She has been diagnosed with low bone density but has wrist fracture ( slipped and fell) .  Daily dietary calcium intake: 1 servings. She denies  family history of osteoporosis, brother with possible parathyroid disease, but no thyroid disease.     HISTORY:  Past Medical History:  Past Medical History:  Diagnosis Date  . Allergy   . Arthritis   . Blood transfusion without reported diagnosis   . Cholelithiasis    large stone detected by CT 01/2016  . GERD (gastroesophageal reflux disease)   . Hyperlipidemia   . Hypertension   . Nephrolithiasis    6mm kidney stone L by CT 01/2016  . 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 ABDOMINAL HYSTERECTOMY    . APPENDECTOMY    . CHOLECYSTECTOMY    . COLONOSCOPY     [facility]- records purges and destroyed- per pt Normal   . SPINE SURGERY    . 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   Procedure: LEFT TOTAL HIP ARTHROPLASTY ANTERIOR APPROACH;  Surgeon: Leandrew Koyanagi, MD;  Location: [place];  Service: Orthopedics;  Laterality: Left;      Social History:  reports that she quit smoking about 9 years ago. She has never used smokeless tobacco. She reports that she does not drink alcohol or use drugs.  Family History: family history includes Arthritis in her brother; Diabetes in her father; Heart disease in her paternal uncle; Heart disease (age of onset: 24) in her father; Hypertension in her sister; Mental illness in her mother; Stroke in her father.   HOME MEDICATIONS: Allergies as of 01/07/2020      Reactions   Tramadol Nausea And Vomiting   Codeine Other (See Comments)   Headache      Medication List       Accurate as of January 07, 2020 11:59 PM. If you have any questions, ask your nurse or doctor.        STOP taking these medications   ciprofloxacin-fluocinolone PF 0.3-0.025 % Soln Commonly known as: OTOVEL Stopped by: Dorita Sciara, MD   methocarbamol  500 MG tablet Commonly known as: Robaxin Stopped by: Dorita Sciara, MD   ondansetron 4 MG tablet Commonly known as: Zofran Stopped by: Dorita Sciara, MD   oxyCODONE 10 mg 12 hr tablet Commonly known as: OXYCONTIN Stopped by: Dorita Sciara, MD   oxyCODONE-acetaminophen 5-325 MG tablet Commonly known as: Percocet Stopped by: Dorita Sciara, MD   polyethylene glycol 17 g packet Commonly known as: MiraLax Stopped by: Dorita Sciara, MD     TAKE these medications   acetaminophen 500 MG tablet Commonly known as: TYLENOL Take 1,000 mg by mouth every 6 (six) hours as needed (for pain.).     alendronate 70 MG tablet Commonly known as: Fosamax Take 1 tablet (70 mg total) by mouth once a week. Take with a full glass of water on an empty stomach. Started by: Dorita Sciara, MD   amLODipine 5 MG tablet Commonly known as: NORVASC Take 1 tablet (5 mg total) by mouth daily.   aspirin EC 81 MG tablet Take 1 tablet (81 mg total) by mouth 2 (two) times daily as needed.   famotidine 20 MG tablet Commonly known as: PEPCID Take 20 mg by mouth daily.   JUICE PLUS FIBRE PO Take 2 tablets by mouth daily.   naproxen sodium 220 MG tablet Commonly known as: ALEVE Take 440 mg by mouth 2 (two) times daily as needed (pain.).   OPTIVE 0.5-0.9 % ophthalmic solution Generic drug: carboxymethylcellul-glycerin Place 1-2 drops into both eyes 3 (three) times daily as needed for dry eyes (dry/irritated eyes.).   simvastatin 40 MG tablet Commonly known as: ZOCOR Take 1 tablet by mouth once daily with breakfast         REVIEW OF SYSTEMS: A comprehensive ROS was conducted with the patient and is negative except as per HPI     OBJECTIVE:  VS: BP 118/78 (BP Location: Left Arm, Patient Position: Sitting, Cuff Size: Normal)   Pulse 88   Temp 98.6 F (37 C)   Ht 5\' 6"  (1.676 m)   Wt 155 lb 6.4 oz (70.5 kg)   SpO2 98%   BMI 25.08 kg/m    Wt Readings from Last 3 Encounters:  01/07/20 155 lb 6.4 oz (70.5 kg)  12/23/19 152 lb (68.9 kg)  11/25/19 152 lb 9.6 oz (69.2 kg)     EXAM: General: Pt appears well and is in NAD  Eyes: External eye exam normal without stare, lid lag or exophthalmos.  EOM intact.  Neck: General: Supple without adenopathy. Thyroid: Thyroid size normal.  No goiter or nodules appreciated. No thyroid bruit.  Lungs: Clear with good BS bilat with no rales, rhonchi, or wheezes  Heart: Auscultation: RRR.  Abdomen: Normoactive bowel sounds, soft, nontender, without masses or organomegaly palpable  Extremities:  BL LE: No pretibial edema normal ROM and  strength.  Skin: Hair: Texture and amount normal with gender appropriate distribution Skin Inspection: No rashes Skin Palpation: Skin temperature, texture, and thickness normal to palpation  Neuro: Cranial nerves: II - XII grossly intact  Motor: Normal strength throughout DTRs: 2+ and symmetric in UE without delay in relaxation phase  Mental Status: Judgment, insight: Intact Orientation: Oriented to time, place, and person Mood and affect: No depression, anxiety, or agitation     DATA REVIEWED: Results for NATAIJA, SIRKIN (MRN [id number]) as of 01/09/2020 07:04  Ref. Range 01/07/2020 16:10  Sodium Latest Ref Range: 135 - 145 mEq/L 138  Potassium Latest Ref Range: 3.5 - 5.1 mEq/L  4.8  Chloride Latest Ref Range: 96 - 112 mEq/L 104  CO2 Latest Ref Range: 19 - 32 mEq/L 25  Glucose Latest Ref Range: 70 - 99 mg/dL 141 (H)  BUN Latest Ref Range: 6 - 23 mg/dL 26 (H)  Creatinine Latest Ref Range: 0.40 - 1.20 mg/dL 0.85  Calcium Latest Ref Range: 8.4 - 10.5 mg/dL 10.7 (H)  Phosphorus Latest Ref Range: 2.3 - 4.6 mg/dL 3.8  Magnesium Latest Ref Range: 1.5 - 2.5 mg/dL 2.0  Albumin Latest Ref Range: 3.5 - 5.2 g/dL 4.7  GFR Latest Ref Range: >60.00 mL/min 66.57  VITD Latest Ref Range: 30.00 - 100.00 ng/mL 20.33 (L)  PTH, Intact Latest Ref Range: 14 - 64 pg/mL 42     Results for ANGENETTA, PIGG (MRN [id number]) as of 01/07/2020 15:35  Ref. Range 10/30/2019 08:21  TSH Latest Ref Range: 0.450 - 4.500 uIU/mL 2.340    DXA 10/24/2018  Site Region Measured Date Measured Age YA BMD Significant CHANGE T-score AP Spine  L1-L4      10[age]24/2018    66.4         -1.9    0.957 g/cm2  DualFemur Neck Right 10/24/2018    66.4         -2.2    0.736 g/cm2  DualFemur Total Mean 10/24/2018    66.4         -1.9    0.768 g/cm2  ASSESSMENT/PLAN/RECOMMENDATIONS:   1. Hypercalcemia   - Corrected serum calcium is normal  at 10.14 mg/dL, her seemingly elevated serum calcium is due to Albumin level > 4 g/dL.  This does NOT exclude hyperparathyroidism as this time, as she did have a true serum elevation while on HCTZ. HCTZ does not cause hypercalcemia per se, except  in those with hyperparathyroid pathology, but at this time, I would recommend avoiding HCTZ and continuing to monitor.   - She was encouraged to stay hydrate - Avoid OTC calcium tablets - Maintaining 2-3 servings of calcium daily      2. Clinical Osteoporosis :  - We discussed the high FRAX rate  Of > 3% at the hips as well as a hx of fragility fracture in the past - I strongly recommend treatment to reduce the risk of future fracture - Pt agreed to bisphosphonate therapy -We discussed rare side effect of atypical fractures and osteonecrosis. Pt encouraged to have an updated dental exam  Medication :  Alendronate 70 mg weekly   3. Vitamin D Insufficiency:  - Pt  will be advised to start OTC vitamin D 1000 iu daily     F/U in 6 months   Signed electronically by: Mack Guise, MD  [facility]  [address] [place], [place] [address] Phone: [phone] FAX: [phone]   CC: Forrest Moron, MD [place] [address] Phone: [phone] Fax: [phone]   Return to Endocrinology clinic as below: Future Appointments  Date Time Provider [place]  02/18/2020  4:00 PM Leandrew Koyanagi, MD [facility] None  07/10/2020  3:20 PM Fatuma Dowers, Melanie Crazier, MD [facility] None

## 2020-01-07 NOTE — Progress Notes (Deleted)
   Post-Op Visit Note   Patient: Lori Cook           Date of Birth: July 16, 1951           MRN: [id number] Visit Date: 01/07/2020 PCP: Forrest Moron, MD   Assessment & Plan:  Chief Complaint:  Chief Complaint  Patient presents with  . Left Hip - Follow-up   Visit Diagnoses:  1. H/O total hip arthroplasty, left     Plan: ***  Follow-Up Instructions: Return in about 6 weeks (around 02/18/2020).   Orders:  Orders Placed This Encounter  Procedures  . XR HIP UNILAT W OR W/O PELVIS 2-3 VIEWS LEFT   No orders of the defined types were placed in this encounter.   Imaging: No results found.  PMFS History: Patient Active Problem List   Diagnosis Date Noted  . Primary osteoarthritis of left hip 11/25/2019  . Status post total replacement of left hip 11/25/2019  . Calculus of gallbladder without cholecystitis without obstruction 05/07/2016  . Nephrolithiasis 05/07/2016  . Pulmonary nodules 05/07/2016  . Hematuria, microscopic 05/07/2016  . Essential hypertension, benign 04/24/2015  . Allergic rhinitis due to pollen 04/24/2015  . Retinal vein occlusion, branch 04/24/2015  . Retinal vein occlusion 03/08/2015  . Essential hypertension-new onset 03/08/2015  . Hyperlipidemia with target LDL less than 130 09/09/2012   Past Medical History:  Diagnosis Date  . Allergy   . Arthritis   . Blood transfusion without reported diagnosis   . Cholelithiasis    large stone detected by CT 01/2016  . GERD (gastroesophageal reflux disease)   . Hyperlipidemia   . Hypertension   . Nephrolithiasis    79mm kidney stone L by CT 01/2016  . 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 Stroke Father   . Diabetes Father   . Heart disease Father 78       AMI  . Hypertension Sister   . Arthritis Brother   . Mental illness Mother   . Heart disease Paternal Uncle   . Colon cancer Neg Hx   . Colon  polyps Neg Hx   . Esophageal cancer Neg Hx   . Rectal cancer Neg Hx   . Stomach cancer Neg Hx     Past Surgical History:  Procedure Laterality Date  . ABDOMINAL HYSTERECTOMY    . APPENDECTOMY    . CHOLECYSTECTOMY    . COLONOSCOPY     [facility]- records purges and destroyed- per pt Normal   . SPINE SURGERY    . 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   Procedure: LEFT TOTAL HIP ARTHROPLASTY ANTERIOR APPROACH;  Surgeon: Leandrew Koyanagi, MD;  Location: [place];  Service: Orthopedics;  Laterality: Left;   Social History   Occupational History  . Not on file  Tobacco Use  . Smoking status: Former Smoker    Quit date: 09/08/2010    Years since quitting: 9.3  . Smokeless tobacco: Never Used  Substance and Sexual Activity  . Alcohol use: No  . Drug use: No  . Sexual activity: Not on file

## 2020-01-07 NOTE — Patient Instructions (Addendum)
-   Stay Hydrated  - Avoid over the counter calcium tablets at this time, but consume 2-3 servings of low fat dairy on daily basis

## 2020-01-08 LAB — BASIC METABOLIC PANEL
BUN: 26 mg/dL — ABNORMAL HIGH (ref 6–23)
CO2: 25 mEq/L (ref 19–32)
Calcium: 10.7 mg/dL — ABNORMAL HIGH (ref 8.4–10.5)
Chloride: 104 mEq/L (ref 96–112)
Creatinine, Ser: 0.85 mg/dL (ref 0.40–1.20)
GFR: 66.57 mL/min (ref >60.00)
Glucose, Bld: 141 mg/dL — ABNORMAL HIGH (ref 70–99)
Potassium: 4.8 mEq/L (ref 3.5–5.1)
Sodium: 138 mEq/L (ref 135–145)

## 2020-01-08 LAB — ALBUMIN: Albumin: 4.7 g/dL (ref 3.5–5.2)

## 2020-01-08 LAB — PARATHYROID HORMONE, INTACT (NO CA): PTH: 42 pg/mL (ref 14–64)

## 2020-01-08 LAB — VITAMIN D 25 HYDROXY (VIT D DEFICIENCY, FRACTURES): VITD: 20.33 ng/mL — ABNORMAL LOW (ref 30.00–100.00)

## 2020-01-08 LAB — PHOSPHORUS: Phosphorus: 3.8 mg/dL (ref 2.3–4.6)

## 2020-01-08 LAB — MAGNESIUM: Magnesium: 2 mg/dL (ref 1.5–2.5)

## 2020-01-09 DIAGNOSIS — E559 Vitamin D deficiency, unspecified: Secondary | ICD-10-CM | POA: Insufficient documentation

## 2020-02-18 ENCOUNTER — Encounter: Payer: Self-pay | Admitting: Orthopaedic Surgery

## 2020-02-18 ENCOUNTER — Ambulatory Visit (INDEPENDENT_AMBULATORY_CARE_PROVIDER_SITE_OTHER): Payer: PPO | Admitting: Orthopaedic Surgery

## 2020-02-18 DIAGNOSIS — Z96642 Presence of left artificial hip joint: Secondary | ICD-10-CM

## 2020-02-18 NOTE — Progress Notes (Signed)
Office Visit Note   Patient: Lori Cook           Date of Birth: 1951/02/08           MRN: [id number] Visit Date: 02/18/2020              Requested by: Forrest Moron, MD [address],  [place] [address] PCP: Forrest Moron, MD   Assessment & Plan: Visit Diagnoses:  1. Status post total replacement of left hip     Plan: Impression is 87-month status post left total hip replacement.  She is overall doing well and very happy.  At this point we will see her back in another 3 months for her 84-month visit with standing AP pelvis.  Follow-Up Instructions: Return in about 3 months (around 05/20/2020).   Orders:  No orders of the defined types were placed in this encounter.  No orders of the defined types were placed in this encounter.     Procedures: No procedures performed   Clinical Data: No additional findings.   Subjective: Chief Complaint  Patient presents with  . Left Hip - Pain, Follow-up, Routine Post Op    Afrah is 12 weeks status post left total hip replacement.  She is overall doing well and is happy with her outcome.  She attended a [employer] game on Sunday and she noticed that her right hip is actually worse than her left hip at this point.  The right hip injection on 01/07/2020 has helped some.   Review of Systems   Objective: Vital Signs: There were no vitals taken for this visit.  Physical Exam  Ortho Exam Left hip shows a fully healed surgical scar.  Good range of motion without pain. Specialty Comments:  No specialty comments available.  Imaging: No results found.   PMFS History: Patient Active Problem List   Diagnosis Date Noted  . Vitamin D insufficiency 01/09/2020  . Osteoporosis without current pathological fracture 01/07/2020  . Hypercalcemia 01/07/2020  . Primary osteoarthritis of left hip 11/25/2019  . Status post total replacement of left hip 11/25/2019  . Calculus of gallbladder without  cholecystitis without obstruction 05/07/2016  . Nephrolithiasis 05/07/2016  . Pulmonary nodules 05/07/2016  . Hematuria, microscopic 05/07/2016  . Essential hypertension, benign 04/24/2015  . Allergic rhinitis due to pollen 04/24/2015  . Retinal vein occlusion, branch 04/24/2015  . Retinal vein occlusion 03/08/2015  . Essential hypertension-new onset 03/08/2015  . Hyperlipidemia with target LDL less than 130 09/09/2012   Past Medical History:  Diagnosis Date  . Allergy   . Arthritis   . Blood transfusion without reported diagnosis   . Cholelithiasis    large stone detected by CT 01/2016  . GERD (gastroesophageal reflux disease)   . Hyperlipidemia   . Hypertension   . Nephrolithiasis    60mm kidney stone L by CT 01/2016  . 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 Stroke Father   . Diabetes Father   . Heart disease Father 66       AMI  . Hypertension Sister   . Arthritis Brother   . Mental illness Mother   . Heart disease Paternal Uncle   . Colon cancer Neg Hx   . Colon polyps Neg Hx   . Esophageal cancer Neg Hx   . Rectal cancer Neg Hx   .  Stomach cancer Neg Hx     Past Surgical History:  Procedure Laterality Date  . ABDOMINAL HYSTERECTOMY    . APPENDECTOMY    . CHOLECYSTECTOMY    . COLONOSCOPY     [facility]- records purges and destroyed- per pt Normal   . SPINE SURGERY    . 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   Procedure: LEFT TOTAL HIP ARTHROPLASTY ANTERIOR APPROACH;  Surgeon: Leandrew Koyanagi, MD;  Location: [place];  Service: Orthopedics;  Laterality: Left;   Social History   Occupational History  . Not on file  Tobacco Use  . Smoking status: Former Smoker    Quit date: 09/08/2010    Years since quitting: 9.4  . Smokeless tobacco: Never Used  Substance and Sexual Activity  . Alcohol use: No  . Drug use: No  . Sexual activity: Not on file

## 2020-03-13 ENCOUNTER — Telehealth: Payer: Self-pay | Admitting: Orthopaedic Surgery

## 2020-03-13 NOTE — Telephone Encounter (Signed)
Pt got a cortisone shot in her r hip on 01/07/20 and was told she had to wait 12 weeks so I made her an appt for 04/01/20 but the pt would like to know if there's anyway she can come in sooner due to her hip really hurting? Pt would like a CB with answer.   [phone]

## 2020-03-17 NOTE — Telephone Encounter (Signed)
Appt scheduled

## 2020-03-17 NOTE — Telephone Encounter (Signed)
That's fine. She can come in now.  Just have her see [facility] directly.  Thanks.

## 2020-03-17 NOTE — Telephone Encounter (Signed)
Ok to come in sooner or does she need to wait?

## 2020-03-20 ENCOUNTER — Encounter: Payer: Self-pay | Admitting: Family Medicine

## 2020-03-20 ENCOUNTER — Ambulatory Visit (INDEPENDENT_AMBULATORY_CARE_PROVIDER_SITE_OTHER): Payer: PPO | Admitting: Family Medicine

## 2020-03-20 ENCOUNTER — Ambulatory Visit: Payer: Self-pay

## 2020-03-20 ENCOUNTER — Other Ambulatory Visit: Payer: Self-pay

## 2020-03-20 DIAGNOSIS — M1611 Unilateral primary osteoarthritis, right hip: Secondary | ICD-10-CM | POA: Diagnosis not present

## 2020-03-20 NOTE — Progress Notes (Signed)
Office Visit Note   Patient: Lori Cook           Date of Birth: 1950-10-04           MRN: [id number] Visit Date: 03/20/2020 Requested by: Forrest Moron, MD [phone] [place],  [place] [address] PCP: Forrest Moron, MD  Subjective: Chief Complaint  Patient presents with   Right Hip - Pain    HPI: She is here with recurrent right hip pain.  Injection in April helped for a little while.  Is starting to wear off a week or 2 ago.  She is wondering if another injection would help.  She is trying to delay surgery a little longer.  She is doing well from the left hip standpoint.              ROS:   All other systems were reviewed and are negative.  Objective: Vital Signs: There were no vitals taken for this visit.  Physical Exam:  General:  Alert and oriented, in no acute distress. Pulm:  Breathing unlabored. Psy:  Normal mood, congruent affect.  Right hip: She has pain with passive flexion and internal rotation.  Imaging: US Guided Needle Placement - No Linked Charges  Result Date: 03/20/2020 Ultrasound-guided right hip injection: After sterile prep with Betadine, injected 8 cc 1% lidocaine without epinephrine and 40 mg methylprednisolone using a 22-gauge spinal needle, passing the needle through the iliofemoral ligament into the femoral head/neck junction.  Injectate was seen filling the joint capsule.    Assessment & Plan: 1.  Right hip osteoarthritis -Injection given as above.  Follow-up as needed.     Procedures: No procedures performed  No notes on file     PMFS History: Patient Active Problem List   Diagnosis Date Noted   Vitamin D insufficiency 01/09/2020   Osteoporosis without current pathological fracture 01/07/2020   Hypercalcemia 01/07/2020   Primary osteoarthritis of left hip 11/25/2019   Status post total replacement of left hip 11/25/2019   Calculus of gallbladder without cholecystitis without obstruction  05/07/2016   Nephrolithiasis 05/07/2016   Pulmonary nodules 05/07/2016   Hematuria, microscopic 05/07/2016   Essential hypertension, benign 04/24/2015   Allergic rhinitis due to pollen 04/24/2015   Retinal vein occlusion, branch 04/24/2015   Retinal vein occlusion 03/08/2015   Essential hypertension-new onset 03/08/2015   Hyperlipidemia with target LDL less than 130 09/09/2012   Past Medical History:  Diagnosis Date   Allergy    Arthritis    Blood transfusion without reported diagnosis    Cholelithiasis    large stone detected by CT 01/2016   GERD (gastroesophageal reflux disease)    Hyperlipidemia    Hypertension    Nephrolithiasis    21mm kidney stone L by CT 01/2016   Retinal vein occlusion, 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   Stroke ([place])    in retina only     Family History  Problem Relation Age of Onset   Stroke Father    Diabetes Father    Heart disease Father 5       AMI   Hypertension Sister    Arthritis Brother    Mental illness Mother    Heart disease Paternal Uncle    Colon cancer Neg Hx    Colon polyps Neg Hx    Esophageal cancer Neg Hx    Rectal cancer Neg Hx    Stomach cancer Neg Hx     Past Surgical History:  Procedure Laterality Date   ABDOMINAL HYSTERECTOMY     APPENDECTOMY     CHOLECYSTECTOMY     COLONOSCOPY     [facility]- records purges and destroyed- per pt Normal    SPINE SURGERY     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   Procedure: LEFT TOTAL HIP ARTHROPLASTY ANTERIOR APPROACH;  Surgeon: Leandrew Koyanagi, MD;  Location: [place];  Service: Orthopedics;  Laterality: Left;   Social History   Occupational History   Not on file  Tobacco Use   Smoking status: Former Smoker    Quit date: 09/08/2010    Years since quitting: 9.5   Smokeless tobacco: Never Used  Substance and Sexual Activity   Alcohol use: No   Drug use: No   Sexual activity: Not on file

## 2020-03-21 DIAGNOSIS — Z1231 Encounter for screening mammogram for malignant neoplasm of breast: Secondary | ICD-10-CM | POA: Diagnosis not present

## 2020-04-01 ENCOUNTER — Ambulatory Visit: Payer: PPO | Admitting: Orthopaedic Surgery

## 2020-04-06 ENCOUNTER — Other Ambulatory Visit: Payer: Self-pay

## 2020-04-06 ENCOUNTER — Encounter (HOSPITAL_BASED_OUTPATIENT_CLINIC_OR_DEPARTMENT_OTHER): Payer: Self-pay

## 2020-04-06 DIAGNOSIS — Z87891 Personal history of nicotine dependence: Secondary | ICD-10-CM | POA: Insufficient documentation

## 2020-04-06 DIAGNOSIS — I10 Essential (primary) hypertension: Secondary | ICD-10-CM | POA: Diagnosis not present

## 2020-04-06 DIAGNOSIS — Z96642 Presence of left artificial hip joint: Secondary | ICD-10-CM | POA: Insufficient documentation

## 2020-04-06 DIAGNOSIS — Z79899 Other long term (current) drug therapy: Secondary | ICD-10-CM | POA: Diagnosis not present

## 2020-04-06 DIAGNOSIS — Z7982 Long term (current) use of aspirin: Secondary | ICD-10-CM | POA: Insufficient documentation

## 2020-04-06 DIAGNOSIS — M25511 Pain in right shoulder: Secondary | ICD-10-CM | POA: Diagnosis not present

## 2020-04-06 NOTE — ED Triage Notes (Signed)
Pt c/o R shoulder pain tonight. Denies injury. Palliated by massage then became worse again. Pt has taken a percocet and APAP without relief.

## 2020-04-07 ENCOUNTER — Emergency Department (HOSPITAL_BASED_OUTPATIENT_CLINIC_OR_DEPARTMENT_OTHER)
Admission: EM | Admit: 2020-04-07 | Discharge: 2020-04-07 | Disposition: A | Discharge status: Home / Self Care | Payer: PPO | Attending: Emergency Medicine | Admitting: Emergency Medicine

## 2020-04-07 DIAGNOSIS — M25511 Pain in right shoulder: Secondary | ICD-10-CM

## 2020-04-07 MED ORDER — METHOCARBAMOL 500 MG PO TABS
500.0000 mg | ORAL_TABLET | Freq: Two times a day (BID) | ORAL | 0 refills | Status: DC
Start: 2020-04-07 — End: 2020-06-09

## 2020-04-07 MED ORDER — METHOCARBAMOL 500 MG PO TABS
500.0000 mg | ORAL_TABLET | Freq: Once | ORAL | Status: AC
  Administered 2020-04-07: 500 mg via ORAL
  Filled 2020-04-07: qty 1

## 2020-04-07 NOTE — ED Provider Notes (Signed)
[facility] EMERGENCY DEPARTMENT Provider Note   CSN: [id number] Arrival date & time: 04/06/20  2221     History Chief Complaint  Patient presents with  . Shoulder Pain    Lori Cook is a 69 y.o. female.  The history is provided by the patient and a relative.  Shoulder Pain Location:  Shoulder Shoulder location:  R shoulder Injury: no   Pain details:    Quality:  Aching and cramping   Radiates to: neck.   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  After sitting down for dinner she began having right shoulder pain that was severe.  It appeared to radiate into her neck.  She feels a muscle spasm.  No chest pain or shortness of breath.  No weakness in her arms or legs.  No trauma or falls.  Pain is now improving    Past Medical History:  Diagnosis Date  . Allergy   . Arthritis   . Blood transfusion without reported diagnosis   . Cholelithiasis    large stone detected by CT 01/2016  . GERD (gastroesophageal reflux disease)   . Hyperlipidemia   . Hypertension   . Nephrolithiasis    83mm kidney stone L by CT 01/2016  . 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 Vitamin D insufficiency 01/09/2020  . Osteoporosis without current pathological fracture 01/07/2020  . Hypercalcemia 01/07/2020  . Primary osteoarthritis of left hip 11/25/2019  . Status post total replacement of left hip 11/25/2019  . Calculus of gallbladder without cholecystitis without obstruction 05/07/2016  . Nephrolithiasis 05/07/2016  . Pulmonary nodules 05/07/2016  . Hematuria, microscopic 05/07/2016  . Essential hypertension, benign 04/24/2015  . Allergic rhinitis due to pollen 04/24/2015  .  Retinal vein occlusion, branch 04/24/2015  . Retinal vein occlusion 03/08/2015  . Essential hypertension-new onset 03/08/2015  . Hyperlipidemia with target LDL less than 130 09/09/2012    Past Surgical History:  Procedure Laterality Date  . ABDOMINAL HYSTERECTOMY    . APPENDECTOMY    . CHOLECYSTECTOMY    . COLONOSCOPY     [facility]- records purges and destroyed- per pt Normal   . SPINE SURGERY    . 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   Procedure: LEFT TOTAL HIP ARTHROPLASTY ANTERIOR APPROACH;  Surgeon: Leandrew Koyanagi, MD;  Location: [place];  Service: Orthopedics;  Laterality: Left;     OB History   No obstetric history on file.     Family History  Problem Relation Age of Onset  . Stroke Father   . Diabetes Father   . Heart disease Father 21       AMI  . Hypertension Sister   . Arthritis Brother   . Mental illness Mother   . Heart disease Paternal Uncle   . Colon cancer Neg Hx   . Colon polyps Neg Hx   . Esophageal cancer Neg Hx   . Rectal cancer Neg Hx   . Stomach cancer Neg Hx     Social History   Tobacco Use  . Smoking status: Former Smoker    Quit date: 09/08/2010  Years since quitting: 9.5  . Smokeless tobacco: Never Used  Vaping Use  . Vaping Use: Never used  Substance Use Topics  . Alcohol use: No  . Drug use: No    Home Medications Prior to Admission medications   Medication Sig Start Date End Date Taking? Authorizing Provider  acetaminophen (TYLENOL) 500 MG tablet Take 1,000 mg by mouth every 6 (six) hours as needed (for pain.).    [provider]  alendronate (FOSAMAX) 70 MG tablet Take 1 tablet (70 mg total) by mouth once a week. Take with a full glass of water on an empty stomach. 01/07/20   Shamleffer, Melanie Crazier, MD  amLODipine (NORVASC) 5 MG tablet Take 1 tablet (5 mg total) by mouth daily. 10/23/19   Forrest Moron, MD  aspirin EC 81 MG tablet Take 1 tablet (81 mg total) by mouth 2 (two) times daily as needed. 11/20/19    Aundra Dubin, PA-C  carboxymethylcellul-glycerin (OPTIVE) 0.5-0.9 % ophthalmic solution Place 1-2 drops into both eyes 3 (three) times daily as needed for dry eyes (dry/irritated eyes.).    [provider]  famotidine (PEPCID) 20 MG tablet Take 20 mg by mouth daily.     [provider]  naproxen sodium (ALEVE) 220 MG tablet Take 440 mg by mouth 2 (two) times daily as needed (pain.).    [provider]  Nutritional Supplements (JUICE PLUS FIBRE PO) Take 2 tablets by mouth daily.     [provider]  simvastatin (ZOCOR) 40 MG tablet Take 1 tablet by mouth once daily with breakfast 10/23/19   Forrest Moron, MD    Allergies    Tramadol and Codeine  Review of Systems   Review of Systems  Constitutional: Negative for fever.  Cardiovascular: Negative for chest pain.  Musculoskeletal: Positive for arthralgias and myalgias. Negative for back pain.  All other systems reviewed and are negative.   Physical Exam Updated Vital Signs BP (!) 148/78 (BP Location: Left Arm)   Pulse 74   Temp 97.8 F (36.6 C) (Oral)   Resp 17   Ht 1.676 m (5\' 6" )   Wt 70.8 kg   SpO2 99%   BMI 25.18 kg/m   Physical Exam CONSTITUTIONAL: Well developed/well nourished HEAD: Normocephalic/atraumatic EYES: EOMI/PERRL ENMT: Mucous membranes moist NECK: supple no meningeal signs SPINE/BACK:entire spine nontender, mild tenderness in the right cervical paraspinal region CV: S1/S2 noted, no murmurs/rubs/gallops noted LUNGS: Lungs are clear to auscultation bilaterally, no apparent distress ABDOMEN: soft NEURO: Pt is awake/alert/appropriate, moves all extremitiesx4.  No facial droop.  Equal power (5/5) with hand grip, wrist flex/extension, elbow flex/extension, and equal power with shoulder abduction/adduction.  No focal sensory deficit to light touch is noted in either UE.   Equal (2+) biceps/brachioradialis reflex in bilateral UE EXTREMITIES: pulses normal/equal, full ROM, no  tenderness noted to the right shoulder.  No erythema.  No swelling or deformities.  Distal pulses equal and intact.  Tenderness in the right trapezius.  No erythema.  Full ROM of right shoulder SKIN: warm, color normal PSYCH: no abnormalities of mood noted, alert and oriented to situation  ED Results / Procedures / Treatments   Labs (all labs ordered are listed, but only abnormal results are displayed) Labs Reviewed - No data to display  EKG  ED ECG REPORT   Date: 04/07/2020 0327  Rate: 62  Rhythm: normal sinus rhythm  QRS Axis: normal  Intervals: normal  ST/T Wave abnormalities: normal  Conduction Disutrbances:none  I have personally reviewed the EKG tracing and agree with the computerized printout as noted.  Radiology No results found.  Procedures Procedures    Medications Ordered in ED Medications  methocarbamol (ROBAXIN) tablet 500 mg (500 mg Oral Given 04/07/20 0329)    ED Course  I have reviewed the triage vital signs and the nursing notes.      MDM Rules/Calculators/A&P                          Patient presented with atraumatic right shoulder and neck pain.  This appears to be consistent with a muscle spasm.  No focal weakness noted.  She is already improving.  Will place on muscle relaxants, also advised warm compresses.  Follow-up with orthopedics as needed No indication for imaging Final Clinical Impression(s) / ED Diagnoses Final diagnoses:  Acute pain of right shoulder    Rx / DC Orders ED Discharge Orders         Ordered    methocarbamol (ROBAXIN) 500 MG tablet  2 times daily     Discontinue  Reprint     04/07/20 0357           Ripley Fraise, MD 04/07/20 [phone]

## 2020-04-08 ENCOUNTER — Ambulatory Visit: Payer: PPO | Admitting: Orthopaedic Surgery

## 2020-04-17 ENCOUNTER — Ambulatory Visit
Admission: EM | Admit: 2020-04-17 | Discharge: 2020-04-17 | Disposition: A | Discharge status: Home / Self Care | Payer: PPO | Attending: Physician Assistant | Admitting: Physician Assistant

## 2020-04-17 DIAGNOSIS — H9201 Otalgia, right ear: Secondary | ICD-10-CM | POA: Diagnosis not present

## 2020-04-17 MED ORDER — AZELASTINE HCL 0.1 % NA SOLN
2.0000 | Freq: Two times a day (BID) | NASAL | 0 refills | Status: DC
Start: 2020-04-17 — End: 2020-10-26

## 2020-04-17 MED ORDER — FLUTICASONE PROPIONATE 50 MCG/ACT NA SUSP: 2.0000 | Freq: Every day | NASAL | 0 refills | Status: AC

## 2020-04-17 NOTE — ED Triage Notes (Signed)
Pt c/o right ear pain recurrently for approx 3 weeks. Pt states she was evaluated/tx for same with ear drops, amoxicillin but pain increased/returned two days ago. Pt reports she experienced sharp pain around right eye/facila area that has been intermittent since yesterday. Denies congestion, runny nose, fever, chills, sore throat, extremity weakness, dizziness, changes to vision/speech. Pt states she was dx with retinal vein occlusion a few years ago.

## 2020-04-17 NOTE — Discharge Instructions (Signed)
No signs of ear infection. Start flonase and azelastine for eustachian tube dysfunction. Follow up with ENT if symptoms not improving.

## 2020-04-17 NOTE — ED Provider Notes (Signed)
[facility]    CSN: [id number] Arrival date & time: 04/17/20  1140      History   Chief Complaint Chief Complaint  Patient presents with  . Otalgia    HPI Lori Cook is a 69 y.o. female.   69 year old female comes in for right ear pain.  States this for started 3 weeks ago, did E-visit, and has taken amoxicillin, ABX eardrops.  This provided mild relief.  However, 2 days ago, started having worsening symptoms with pressure.  Noticed pressure/pain to the right eye, patient points to maxillary sinus as well.  Denies rhinorrhea, nasal congestion, fever, vision changes.  Denies photophobia.  Denies sinus pressure.     Past Medical History:  Diagnosis Date  . Allergy   . Arthritis   . Blood transfusion without reported diagnosis   . Cholelithiasis    large stone detected by CT 01/2016  . GERD (gastroesophageal reflux disease)   . Hyperlipidemia   . Hypertension   . Nephrolithiasis    26mm kidney stone L by CT 01/2016  . 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 Vitamin D insufficiency 01/09/2020  . Osteoporosis without current pathological fracture 01/07/2020  . Hypercalcemia 01/07/2020  . Primary osteoarthritis of left hip 11/25/2019  . Status post total replacement of left hip 11/25/2019  . Calculus of gallbladder without cholecystitis without obstruction 05/07/2016  . Nephrolithiasis 05/07/2016  . Pulmonary nodules 05/07/2016  . Hematuria, microscopic 05/07/2016  . Essential hypertension, benign 04/24/2015  . Allergic rhinitis due to pollen 04/24/2015  . Retinal vein occlusion, branch 04/24/2015  . Retinal vein occlusion 03/08/2015  . Essential hypertension-new onset 03/08/2015  . Hyperlipidemia with target LDL less than 130 09/09/2012    Past Surgical History:  Procedure Laterality Date  . ABDOMINAL HYSTERECTOMY    . APPENDECTOMY    . CHOLECYSTECTOMY     . COLONOSCOPY     [facility]- records purges and destroyed- per pt Normal   . SPINE SURGERY    . 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   Procedure: LEFT TOTAL HIP ARTHROPLASTY ANTERIOR APPROACH;  Surgeon: Leandrew Koyanagi, MD;  Location: [facility];  Service: Orthopedics;  Laterality: Left;    OB History   No obstetric history on file.      Home Medications    Prior to Admission medications   Medication Sig Start Date End Date Taking? Authorizing Provider  alendronate (FOSAMAX) 70 MG tablet Take 1 tablet (70 mg total) by mouth once a week. Take with a full glass of water on an empty stomach. 01/07/20  Yes Shamleffer, Melanie Crazier, MD  amLODipine (NORVASC) 5 MG tablet Take 1 tablet (5 mg total) by mouth daily. 10/23/19  Yes Forrest Moron, MD  aspirin EC 81 MG tablet Take 1 tablet (81 mg total) by mouth 2 (two) times daily as needed. 11/20/19  Yes Aundra Dubin, PA-C  famotidine (PEPCID) 20 MG tablet Take 20 mg by mouth daily.    Yes [provider]  methocarbamol (ROBAXIN) 500 MG tablet Take 1 tablet (500 mg total) by mouth 2 (two) times daily. 04/07/20  Yes Ripley Fraise, MD  simvastatin (ZOCOR) 40 MG tablet Take 1 tablet by mouth once daily with breakfast 10/23/19  Yes Stallings, Zoe A, MD  acetaminophen (TYLENOL) 500 MG tablet Take 1,000 mg by mouth every 6 (six) hours  as needed (for pain.).    [provider]  azelastine (ASTELIN) 0.1 % nasal spray Place 2 sprays into both nostrils 2 (two) times daily. 04/17/20   Tasia Catchings, Donaven Criswell V, PA-C  carboxymethylcellul-glycerin (OPTIVE) 0.5-0.9 % ophthalmic solution Place 1-2 drops into both eyes 3 (three) times daily as needed for dry eyes (dry/irritated eyes.).    [provider]  fluticasone (FLONASE) 50 MCG/ACT nasal spray Place 2 sprays into both nostrils daily. 04/17/20   Tasia Catchings, Tanush Drees V, PA-C  naproxen sodium (ALEVE) 220 MG tablet Take 440 mg by mouth 2 (two) times daily as needed (pain.).    [provider]    Nutritional Supplements (JUICE PLUS FIBRE PO) Take 2 tablets by mouth daily.     [provider]    Family History Family History  Problem Relation Age of Onset  . Stroke Father   . Diabetes Father   . Heart disease Father 31       AMI  . Hypertension Sister   . Arthritis Brother   . Mental illness Mother   . Heart disease Paternal Uncle   . Colon cancer Neg Hx   . Colon polyps Neg Hx   . Esophageal cancer Neg Hx   . Rectal cancer Neg Hx   . Stomach cancer Neg Hx     Social History Social History   Tobacco Use  . Smoking status: Former Smoker    Quit date: 09/08/2010    Years since quitting: 9.6  . Smokeless tobacco: Never Used  Vaping Use  . Vaping Use: Never used  Substance Use Topics  . Alcohol use: No  . Drug use: No     Allergies   Tramadol and Codeine   Review of Systems Review of Systems  Reason unable to perform ROS: See HPI as above.     Physical Exam Triage Vital Signs ED Triage Vitals  Enc Vitals Group     BP 04/17/20 1220 (!) 162/85     Pulse Rate 04/17/20 1220 67     Resp 04/17/20 1220 20     Temp 04/17/20 1220 97.9 F (36.6 C)     Temp Source 04/17/20 1220 Oral     SpO2 04/17/20 1220 97 %     Weight --      Height --      Head Circumference --      Peak Flow --      Pain Score 04/17/20 1231 2     Pain Loc --      Pain Edu? --      Excl. in [place]? --    No data found.  Updated Vital Signs BP (!) 162/85 (BP Location: Left Arm)   Pulse 67   Temp 97.9 F (36.6 C) (Oral)   Resp 20   SpO2 97%   Physical Exam Constitutional:      General: She is not in acute distress.    Appearance: Normal appearance. She is well-developed. She is not toxic-appearing or diaphoretic.  HENT:     Head: Normocephalic and atraumatic.     Comments: No rashes seen to the face/neck/ear.    Right Ear: Tympanic membrane, ear canal and external ear normal. Tympanic membrane is not erythematous or bulging.     Left Ear: Tympanic membrane, ear  canal and external ear normal. Tympanic membrane is not erythematous or bulging.     Nose: Septal deviation present. No congestion or rhinorrhea.     Mouth/Throat:  Mouth: Mucous membranes are moist.     Pharynx: Oropharynx is clear. Uvula midline.  Eyes:     Extraocular Movements: Extraocular movements intact.     Conjunctiva/sclera: Conjunctivae normal.     Pupils: Pupils are equal, round, and reactive to light.  Pulmonary:     Effort: Pulmonary effort is normal. No respiratory distress.  Musculoskeletal:     Cervical back: Normal range of motion and neck supple.  Skin:    General: Skin is warm and dry.  Neurological:     Mental Status: She is alert and oriented to person, place, and time.      UC Treatments / Results  Labs (all labs ordered are listed, but only abnormal results are displayed) Labs Reviewed - No data to display  EKG   Radiology No results found.  Procedures Procedures (including critical care time)  Medications Ordered in UC Medications - No data to display  Initial Impression / Assessment and Plan / UC Course  I have reviewed the triage vital signs and the nursing notes.  Pertinent labs & imaging results that were available during my care of the patient were reviewed by me and considered in my medical decision making (see chart for details).    Start azelastine and Flonase for possible eustachian tube dysfunction causing symptoms.  Patient points to maxillary sinus for eye pain.  No vision changes.  Patient has eye appointment tomorrow for evaluation.  Continue to monitor symptoms.  Return precautions given.  Patient expresses understanding and agrees to plan.  Final Clinical Impressions(s) / UC Diagnoses   Final diagnoses:  Right ear pain    ED Prescriptions    Medication Sig Dispense Auth. Provider   azelastine (ASTELIN) 0.1 % nasal spray Place 2 sprays into both nostrils 2 (two) times daily. 30 mL Kourosh Jablonsky V, PA-C   fluticasone (FLONASE)  50 MCG/ACT nasal spray Place 2 sprays into both nostrils daily. 1 g Ok Edwards, PA-C     PDMP not reviewed this encounter.   Ok Edwards, PA-C 04/17/20 1259

## 2020-04-18 DIAGNOSIS — Z0100 Encounter for examination of eyes and vision without abnormal findings: Secondary | ICD-10-CM | POA: Diagnosis not present

## 2020-04-29 ENCOUNTER — Other Ambulatory Visit: Payer: Self-pay | Admitting: Physician Assistant

## 2020-04-29 ENCOUNTER — Telehealth: Payer: Self-pay | Admitting: Orthopaedic Surgery

## 2020-04-29 MED ORDER — AMOXICILLIN 500 MG PO CAPS
ORAL_CAPSULE | ORAL | 1 refills | Status: DC
Start: 2020-04-29 — End: 2020-10-26

## 2020-04-29 NOTE — Telephone Encounter (Signed)
Patient aware she should take 4 tabs --60mins-1 hour prior to dental procedure.

## 2020-04-29 NOTE — Telephone Encounter (Signed)
Pt called wanting to get a CB to verify her antibiotic dosage?   [phone]

## 2020-04-29 NOTE — Telephone Encounter (Signed)
Patient called requesting a antibiotic before her dentist appt tomorrow morning. [facility] is where prescription needs to be sent. Pease call patient when medication is called in. Patient phone number is [phone]

## 2020-04-29 NOTE — Telephone Encounter (Signed)
I sent in

## 2020-05-19 ENCOUNTER — Ambulatory Visit: Payer: PPO | Admitting: Orthopaedic Surgery

## 2020-05-19 ENCOUNTER — Telehealth: Payer: Self-pay

## 2020-05-19 ENCOUNTER — Ambulatory Visit (INDEPENDENT_AMBULATORY_CARE_PROVIDER_SITE_OTHER): Payer: PPO

## 2020-05-19 DIAGNOSIS — Z96642 Presence of left artificial hip joint: Secondary | ICD-10-CM | POA: Diagnosis not present

## 2020-05-19 NOTE — Telephone Encounter (Signed)
lvm for pt informing her the handicap application was at the front desk

## 2020-05-19 NOTE — Progress Notes (Signed)
Office Visit Note   Patient: Lori Cook           Date of Birth: 1951-07-01           MRN: [id number] Visit Date: 05/19/2020              Requested by: Forrest Moron, MD [id number] [place],  [place] [address] PCP: Wendie Agreste, MD   Assessment & Plan: Visit Diagnoses:  1. Status post total replacement of left hip     Plan: Impression is end-stage right hip degenerative joint disease and stable left total hip replacement 6 months ago.  She has done very well from her left hip replacement and would like to proceed with a right total hip replacement after consideration of her options.  Risk benefits rehab recovery reviewed again with the patient.  Patient had vomiting with oxycodone during the prior hospitalization therefore we will try just hydrocodone this time.  Questions encouraged and answered.  Follow-Up Instructions: Return in about 6 months (around 11/16/2020) for left hip.   Orders:  Orders Placed This Encounter  Procedures  . XR Pelvis 1-2 Views   No orders of the defined types were placed in this encounter.     Procedures: No procedures performed   Clinical Data: No additional findings.   Subjective: Chief Complaint  Patient presents with  . Left Hip - Routine Post Op    Lori Cook is nearly 6 months status post left total hip replacement.  She is doing very well has no complaints.  Right hip is bothering her significantly.   Review of Systems   Objective: Vital Signs: There were no vitals taken for this visit.  Physical Exam  Ortho Exam Left hip shows a fully healed surgical scar.  Right hip shows painful range of motion with antalgic gait. Specialty Comments:  No specialty comments available.  Imaging: No results found.   PMFS History: Patient Active Problem List   Diagnosis Date Noted  . Vitamin D insufficiency 01/09/2020  . Osteoporosis without current pathological fracture 01/07/2020  . Hypercalcemia  01/07/2020  . Primary osteoarthritis of left hip 11/25/2019  . Status post total replacement of left hip 11/25/2019  . Calculus of gallbladder without cholecystitis without obstruction 05/07/2016  . Nephrolithiasis 05/07/2016  . Pulmonary nodules 05/07/2016  . Hematuria, microscopic 05/07/2016  . Essential hypertension, benign 04/24/2015  . Allergic rhinitis due to pollen 04/24/2015  . Retinal vein occlusion, branch 04/24/2015  . Retinal vein occlusion 03/08/2015  . Essential hypertension-new onset 03/08/2015  . Hyperlipidemia with target LDL less than 130 09/09/2012   Past Medical History:  Diagnosis Date  . Allergy   . Arthritis   . Blood transfusion without reported diagnosis   . Cholelithiasis    large stone detected by CT 01/2016  . GERD (gastroesophageal reflux disease)   . Hyperlipidemia   . Hypertension   . Nephrolithiasis    106mm kidney stone L by CT 01/2016  . 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 Stroke Father   . Diabetes Father   . Heart disease Father 78       AMI  . Hypertension Sister   . Arthritis Brother   . Mental illness Mother   . Heart disease Paternal Uncle   . Colon cancer Neg Hx   . Colon polyps  Neg Hx   . Esophageal cancer Neg Hx   . Rectal cancer Neg Hx   . Stomach cancer Neg Hx     Past Surgical History:  Procedure Laterality Date  . ABDOMINAL HYSTERECTOMY    . APPENDECTOMY    . CHOLECYSTECTOMY    . COLONOSCOPY     [facility]- records purges and destroyed- per pt Normal   . SPINE SURGERY    . 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   Procedure: LEFT TOTAL HIP ARTHROPLASTY ANTERIOR APPROACH;  Surgeon: Leandrew Koyanagi, MD;  Location: [facility];  Service: Orthopedics;  Laterality: Left;   Social History   Occupational History  . Not on file  Tobacco Use  . Smoking status: Former Smoker    Quit date: 09/08/2010    Years since quitting: 9.7  .  Smokeless tobacco: Never Used  Vaping Use  . Vaping Use: Never used  Substance and Sexual Activity  . Alcohol use: No  . Drug use: No  . Sexual activity: Not on file

## 2020-05-20 ENCOUNTER — Ambulatory Visit: Payer: PPO | Admitting: Orthopaedic Surgery

## 2020-06-04 ENCOUNTER — Encounter (INDEPENDENT_AMBULATORY_CARE_PROVIDER_SITE_OTHER): Payer: Self-pay | Admitting: Otolaryngology

## 2020-06-04 ENCOUNTER — Ambulatory Visit (INDEPENDENT_AMBULATORY_CARE_PROVIDER_SITE_OTHER): Payer: PPO | Admitting: Otolaryngology

## 2020-06-04 ENCOUNTER — Other Ambulatory Visit: Payer: Self-pay

## 2020-06-04 VITALS — Temp 97.7°F

## 2020-06-04 DIAGNOSIS — H60311 Diffuse otitis externa, right ear: Secondary | ICD-10-CM

## 2020-06-04 NOTE — Progress Notes (Addendum)
HPI: Lori Cook is a 69 y.o. female who presents for evaluation of chronic right ear infection.  Apparently this has been bothering her since July.  She was initially diagnosed with external ear canal infection and treated with eardrops as well as an antibiotic.  She still had intermittent right ear discomfort and was treated with Flonase for eustachian tube dysfunction.  She apparently had a ear canal infection about a year ago and was treated with drops.  She does not use any hearing aids or earplugs in her ears.  She has been using eardrops recently.  Past Medical History:  Diagnosis Date  . Allergy   . Arthritis   . Blood transfusion without reported diagnosis   . Cholelithiasis    large stone detected by CT 01/2016  . GERD (gastroesophageal reflux disease)   . Hyperlipidemia   . Hypertension   . Nephrolithiasis    67mm kidney stone L by CT 01/2016  . 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 ABDOMINAL HYSTERECTOMY    . APPENDECTOMY    . CHOLECYSTECTOMY    . COLONOSCOPY     [facility]- records purges and destroyed- per pt Normal   . SPINE SURGERY    . 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 Marital status: Single    Spouse name: Not on file  . Number of children: Not on file  . Years of education: Not on file  . Highest education level: Not on file  Occupational History  . Not on file  Tobacco Use  . Smoking status: Former Smoker    Packs/day: 1.50    Years: 39.00    Pack years: 58.50    Start date: 55    Quit date: 09/08/2010    Years since quitting: 9.7  . Smokeless tobacco: Never Used  Vaping Use  . Vaping Use: Never used  Substance and Sexual Activity  . Alcohol use: No   . Drug use: No  . Sexual activity: Not on file  Other Topics Concern  . Not on file  Social History Narrative   Marital status: single; not dating; not interested in 2018     Children: 2 daughters, 1 son passed away 78.51 years old.  Two grandchildren (20,8)      Lives: with daughter, grandson.      Employment:  Education administrator business full time; one employee      Tobacco: quit smoking      Alcohol:  None      Exercise: no formal exercise      ADLs: independent with ADLs; drives; no assistant devices.       Advanced Directives: none in 2018.  FULL CODE; no prolonged measures. HCPOA: daughters   Social Determinants of Health   Financial Resource Strain:   . Difficulty of Paying Living Expenses: Not on file  Food Insecurity:   . Worried About Charity fundraiser in the Last Year: Not on file  . Ran Out of Food in the Last Year: Not on file  Transportation Needs:   . Lack of Transportation (Medical): Not on file  . Lack of Transportation (Non-Medical): Not on file  Physical Activity:   .  Days of Exercise per Week: Not on file  . Minutes of Exercise per Session: Not on file  Stress:   . Feeling of Stress : Not on file  Social Connections:   . Frequency of Communication with Friends and Family: Not on file  . Frequency of Social Gatherings with Friends and Family: Not on file  . Attends Religious Services: Not on file  . Active Member of Clubs or Organizations: Not on file  . Attends Archivist Meetings: Not on file  . Marital Status: Not on file   Family History  Problem Relation Age of Onset  . Stroke Father   . Diabetes Father   . Heart disease Father 43       AMI  . Hypertension Sister   . Arthritis Brother   . Mental illness Mother   . Heart disease Paternal Uncle   . Colon cancer Neg Hx   . Colon polyps Neg Hx   . Esophageal cancer Neg Hx   . Rectal cancer Neg Hx   . Stomach cancer Neg Hx    Allergies  Allergen Reactions  . Tramadol Nausea And Vomiting  .  Codeine Other (See Comments)    Headache  . Oxycodone Palpitations   Prior to Admission medications   Medication Sig Start Date End Date Taking? Authorizing Provider  acetaminophen (TYLENOL) 500 MG tablet Take 1,000 mg by mouth every 6 (six) hours as needed for moderate pain.    Yes [provider]  alendronate (FOSAMAX) 70 MG tablet Take 1 tablet (70 mg total) by mouth once a week. Take with a full glass of water on an empty stomach. 01/07/20  Yes Shamleffer, Melanie Crazier, MD  amLODipine (NORVASC) 5 MG tablet Take 1 tablet (5 mg total) by mouth daily. 10/23/19  Yes Stallings, Zoe A, MD  amoxicillin (AMOXIL) 500 MG capsule Take 4 caps one hour prior to dental work Patient taking differently: Take 2,000 mg by mouth See admin instructions. Take 2000 mg by mouth one hour prior to dental work 04/29/20  Yes Aundra Dubin, PA-C  aspirin EC 81 MG tablet Take 1 tablet (81 mg total) by mouth 2 (two) times daily as needed. Patient taking differently: Take 81 mg by mouth daily.  11/20/19  Yes Aundra Dubin, PA-C  azelastine (ASTELIN) 0.1 % nasal spray Place 2 sprays into both nostrils 2 (two) times daily. 04/17/20  Yes Yu, Amy V, PA-C  carboxymethylcellul-glycerin (OPTIVE) 0.5-0.9 % ophthalmic solution Place 1-2 drops into both eyes 3 (three) times daily as needed for dry eyes (dry/irritated eyes.).   Yes [provider]  famotidine (PEPCID) 20 MG tablet Take 20 mg by mouth daily.    Yes [provider]  fluticasone (FLONASE) 50 MCG/ACT nasal spray Place 2 sprays into both nostrils daily. 04/17/20  Yes Yu, Amy V, PA-C  methocarbamol (ROBAXIN) 500 MG tablet Take 1 tablet (500 mg total) by mouth 2 (two) times daily. 04/07/20  Yes Ripley Fraise, MD  naproxen sodium (ALEVE) 220 MG tablet Take 440 mg by mouth 2 (two) times daily as needed (pain).    Yes [provider]  Nutritional Supplements (JUICE PLUS FIBRE PO) Take 2 tablets by mouth daily.    Yes [provider]  simvastatin (ZOCOR) 40 MG tablet Take 1 tablet by mouth once daily with breakfast Patient taking differently: Take 40 mg by mouth daily with breakfast.  10/23/19  Yes Forrest Moron, MD     Positive ROS:  Otherwise negative  All other systems have been reviewed and were otherwise negative with the exception of those mentioned in the HPI and as above.  Physical Exam: Constitutional: Alert, well-appearing, no acute distress Ears: External ears without lesions or tenderness.  Left ear canal left TM are clear.  Right ear canal reveals some mild inflammatory changes with some debris within the ear canal.  This was cleaned with suction and hydroperoxide ear rinses after cleaning the ear canal I applied gentian violet, Floxin and CSF powder to the right ear.  On tuning fork testing she heard about the same in both ears with AC > BC bilaterally. Nasal: External nose without lesions. Septum is deviated to the right with mild rhinitis but no clinical evidence of active infection.. Clear nasal passages Oral: Lips and gums without lesions. Tongue and palate mucosa without lesions. Posterior oropharynx clear.  Patient is status post tonsillectomy with clear oropharynx. Neck: No palpable adenopathy or masses.  No TMJ dysfunction or pain. Respiratory: Breathing comfortably  Skin: No facial/neck lesions or rash noted.  Cerumen impaction removal  Date/Time: 06/04/2020 2:10 PM Performed by: Rozetta Nunnery, MD Authorized by: Rozetta Nunnery, MD   Consent:    Consent obtained:  Verbal   Consent given by:  Patient   Risks discussed:  Pain and bleeding Procedure details:    Location:  R ear   Procedure type: irrigation and suction   Post-procedure details:    Inspection:  TM intact and canal normal   Hearing quality:  Improved   Patient tolerance of procedure:  Tolerated well, no immediate complications Comments:     Right ear canal was cleaned with suction and hydroperoxide.  The TMs  were clear bilaterally.  After cleaning the right ear canal I applied gentian violet Floxin and CSF powder.    Assessment: Chronic right external otitis  Plan: I cleaned the ear canal in the office and applied gentian violet, Floxin and CSF powder recommended keeping the ear Dry. She will follow-up as needed   Radene Journey, MD

## 2020-06-08 ENCOUNTER — Other Ambulatory Visit: Payer: Self-pay

## 2020-06-09 ENCOUNTER — Other Ambulatory Visit: Payer: Self-pay | Admitting: Physician Assistant

## 2020-06-09 MED ORDER — CELECOXIB 200 MG PO CAPS
200.0000 mg | ORAL_CAPSULE | Freq: Two times a day (BID) | ORAL | 2 refills | Status: DC
Start: 2020-06-09 — End: 2020-07-24

## 2020-06-09 MED ORDER — ONDANSETRON HCL 4 MG PO TABS: 4.0000 mg | ORAL_TABLET | Freq: Three times a day (TID) | ORAL | 0 refills | Status: DC | PRN

## 2020-06-09 MED ORDER — METHOCARBAMOL 750 MG PO TABS
750.0000 mg | ORAL_TABLET | Freq: Three times a day (TID) | ORAL | 0 refills | Status: DC | PRN
Start: 2020-06-09 — End: 2020-06-27

## 2020-06-09 MED ORDER — ASPIRIN EC 81 MG PO TBEC
81.0000 mg | DELAYED_RELEASE_TABLET | Freq: Two times a day (BID) | ORAL | 0 refills | Status: DC
Start: 2020-06-09 — End: 2021-08-18

## 2020-06-09 MED ORDER — OXYCODONE-ACETAMINOPHEN 5-325 MG PO TABS: 1.0000 | ORAL_TABLET | Freq: Four times a day (QID) | ORAL | 0 refills | Status: DC | PRN

## 2020-06-09 MED ORDER — DOCUSATE SODIUM 100 MG PO CAPS
100.0000 mg | ORAL_CAPSULE | Freq: Every day | ORAL | 2 refills | Status: DC | PRN
Start: 2020-06-09 — End: 2020-07-24

## 2020-06-10 ENCOUNTER — Telehealth: Payer: Self-pay

## 2020-06-10 NOTE — Telephone Encounter (Signed)
Called patient to let her know we need to get some lab work per Dr Erlinda Hong. No answer LMOM.   All she needs is a nurse visit to draw a Prealbumin.   LAB WORK---PREALBUMIN---CALL TRIAGE OR LIZ WHEN PATIENT ARRIVES

## 2020-06-10 NOTE — Progress Notes (Signed)
[place], [address] [place] [address] Phone: [phone] Fax: [phone]  [id number] - [place], [address]. [place] [address] Phone: [phone] Fax: [phone]  [place], [address] [place] [address] Phone: [phone] Fax: [phone]      Your procedure is scheduled on Monday 06/15/2020.  Report to [facility] Main Entrance "A" at 07:45 A.M., and check in at the Admitting office.  Call this number if you have problems the morning of surgery:  [phone]  Call [phone] if you have any questions prior to your surgery date Monday-Friday 8am-4pm    Remember:  Do not eat after midnight the night before your surgery  You may drink clear liquids until 06:45am the morning of your surgery.   Clear liquids allowed are: Water, Non-Citrus Juices (without pulp), Carbonated Beverages, Clear Tea, Black Coffee Only, and Gatorade   Enhanced Recovery after Surgery for Orthopedics Enhanced Recovery after Surgery is a protocol used to improve the stress on your body and your recovery after surgery.  Patient Instructions  . The night before surgery:  o No food after midnight. ONLY clear liquids after midnight  .  Marland Kitchen The day of surgery (if you do NOT have diabetes):  o Drink ONE (1) Pre-Surgery Clear Ensure by 06:45 am the morning of surgery   o This drink was given to you during your hospital  pre-op appointment visit. o Nothing else to drink after completing the  Pre-Surgery Clear Ensure.         If you have questions, please contact your surgeon's office.     Take these medicines the morning of surgery with A SIP OF WATER: Amlodipine (Norvasc) Azelastine (Astelin) nasal spray Famotidine (Pepcid) Fluticasone (Flonase) nasal spray Simvastatin (Zocor)  If needed you may  take: Carboxymethylcellul -glycerin (Optive) eye solution   As of today, STOP taking any Aspirin (unless otherwise instructed by your surgeon) Aleve, Naproxen, Ibuprofen, Motrin, Advil, Goody's, BC's, all herbal medications, fish oil, and all vitamins.  Follow your surgeon's instructions on when to stop Aspirin.  If no instructions were given by your surgeon then you will need to call the office to get those instructions.                        Do not wear jewelry, make up, or nail polish            Do not wear lotions, powders, perfumes, or deodorant.            Do not shave 48 hours prior to surgery.  Men may shave face and neck.            Do not bring valuables to the hospital.            [facility] is not responsible for any belongings or valuables.  Do NOT Smoke (Tobacco/Vaping) or drink Alcohol 24 hours prior to your procedure  If you use a CPAP at night, you may bring all equipment for your overnight stay.   Contacts, glasses, dentures or bridgework may not be worn into surgery.      For patients admitted to the hospital, discharge time will be determined by your treatment team.   Patients discharged the day of surgery will not be allowed to drive home, and someone needs to stay with them  for 24 hours.    Special instructions:   [facility]- Preparing For Surgery  Before surgery, you can play an important role. Because skin is not sterile, your skin needs to be as free of germs as possible. You can reduce the number of germs on your skin by washing with CHG (chlorahexidine gluconate) Soap before surgery.  CHG is an antiseptic cleaner which kills germs and bonds with the skin to continue killing germs even after washing.    Oral Hygiene is also important to reduce your risk of infection.  Remember - BRUSH YOUR TEETH THE MORNING OF SURGERY WITH YOUR REGULAR TOOTHPASTE  Please do not use if you have an allergy to CHG or antibacterial soaps. If your skin becomes  reddened/irritated stop using the CHG.  Do not shave (including legs and underarms) for at least 48 hours prior to first CHG shower. It is OK to shave your face.  Please follow these instructions carefully.   1. Shower the NIGHT BEFORE SURGERY and the MORNING OF SURGERY with CHG Soap.   2. If you chose to wash your hair, wash your hair first as usual with your normal shampoo.  3. After you shampoo, rinse your hair and body thoroughly to remove the shampoo.  4. Use CHG as you would any other liquid soap. You can apply CHG directly to the skin and wash gently with a scrungie or a clean washcloth.   5. Apply the CHG Soap to your body ONLY FROM THE NECK DOWN.  Do not use on open wounds or open sores. Avoid contact with your eyes, ears, mouth and genitals (private parts). Wash Face and genitals (private parts)  with your normal soap.   6. Wash thoroughly, paying special attention to the area where your surgery will be performed.  7. Thoroughly rinse your body with warm water from the neck down.  8. DO NOT shower/wash with your normal soap after using and rinsing off the CHG Soap.  9. Pat yourself dry with a CLEAN TOWEL.  10. Wear CLEAN PAJAMAS to bed the night before surgery  11. Place CLEAN SHEETS on your bed the night of your first shower and DO NOT SLEEP WITH PETS.   Day of Surgery: Shower with CHG soap as directed Wear Clean/Comfortable clothing the morning of surgery Do not apply any deodorants/lotions.   Remember to brush your teeth WITH YOUR REGULAR TOOTHPASTE.   Please read over the following fact sheets that you were given.

## 2020-06-11 ENCOUNTER — Encounter (HOSPITAL_COMMUNITY)
Admission: RE | Admit: 2020-06-11 | Discharge: 2020-06-11 | Disposition: A | Discharge status: Home / Self Care | Payer: PPO | Source: Ambulatory Visit | Attending: Orthopaedic Surgery | Admitting: Orthopaedic Surgery

## 2020-06-11 ENCOUNTER — Encounter (HOSPITAL_COMMUNITY): Payer: Self-pay

## 2020-06-11 ENCOUNTER — Telehealth: Payer: Self-pay

## 2020-06-11 ENCOUNTER — Other Ambulatory Visit (HOSPITAL_COMMUNITY)
Admission: RE | Admit: 2020-06-11 | Discharge: 2020-06-11 | Disposition: A | Discharge status: Home / Self Care | Payer: PPO | Source: Ambulatory Visit | Attending: Orthopaedic Surgery | Admitting: Orthopaedic Surgery

## 2020-06-11 ENCOUNTER — Ambulatory Visit (HOSPITAL_COMMUNITY)
Admission: RE | Admit: 2020-06-11 | Discharge: 2020-06-11 | Disposition: A | Discharge status: Home / Self Care | Payer: PPO | Attending: Orthopaedic Surgery | Admitting: Orthopaedic Surgery

## 2020-06-11 ENCOUNTER — Ambulatory Visit (INDEPENDENT_AMBULATORY_CARE_PROVIDER_SITE_OTHER): Payer: PPO

## 2020-06-11 ENCOUNTER — Other Ambulatory Visit: Payer: Self-pay

## 2020-06-11 DIAGNOSIS — R001 Bradycardia, unspecified: Secondary | ICD-10-CM | POA: Diagnosis not present

## 2020-06-11 DIAGNOSIS — Z20822 Contact with and (suspected) exposure to covid-19: Secondary | ICD-10-CM | POA: Diagnosis not present

## 2020-06-11 DIAGNOSIS — M1611 Unilateral primary osteoarthritis, right hip: Secondary | ICD-10-CM

## 2020-06-11 DIAGNOSIS — M4185 Other forms of scoliosis, thoracolumbar region: Secondary | ICD-10-CM | POA: Diagnosis not present

## 2020-06-11 DIAGNOSIS — Z01818 Encounter for other preprocedural examination: Secondary | ICD-10-CM | POA: Insufficient documentation

## 2020-06-11 LAB — COMPREHENSIVE METABOLIC PANEL
ALT: 16 U/L (ref 0–44)
AST: 20 U/L (ref 15–41)
Albumin: 4.4 g/dL (ref 3.5–5.0)
Alkaline Phosphatase: 70 U/L (ref 38–126)
Anion gap: 10 (ref 5–15)
BUN: 18 mg/dL (ref 8–23)
CO2: 24 mmol/L (ref 22–32)
Calcium: 9.8 mg/dL (ref 8.9–10.3)
Chloride: 105 mmol/L (ref 98–111)
Creatinine, Ser: 0.8 mg/dL (ref 0.44–1.00)
GFR calc Af Amer: >60 mL/min (ref >60)
GFR calc non Af Amer: >60 mL/min (ref >60)
Glucose, Bld: 102 mg/dL — ABNORMAL HIGH (ref 70–99)
Potassium: 4 mmol/L (ref 3.5–5.1)
Sodium: 139 mmol/L (ref 135–145)
Total Bilirubin: 1 mg/dL (ref 0.3–1.2)
Total Protein: 7.2 g/dL (ref 6.5–8.1)

## 2020-06-11 LAB — CBC WITH DIFFERENTIAL/PLATELET
Abs Immature Granulocytes: 0.02 10*3/uL (ref 0.00–0.07)
Basophils Absolute: 0 10*3/uL (ref 0.0–0.1)
Basophils Relative: 1 %
Eosinophils Absolute: 0.1 10*3/uL (ref 0.0–0.5)
Eosinophils Relative: 1 %
HCT: 40.9 % (ref 36.0–46.0)
Hemoglobin: 13.2 g/dL (ref 12.0–15.0)
Immature Granulocytes: 0 %
Lymphocytes Relative: 35 %
Lymphs Abs: 3.1 10*3/uL (ref 0.7–4.0)
MCH: 28.9 pg (ref 26.0–34.0)
MCHC: 32.3 g/dL (ref 30.0–36.0)
MCV: 89.7 fL (ref 80.0–100.0)
Monocytes Absolute: 0.9 10*3/uL (ref 0.1–1.0)
Monocytes Relative: 11 %
Neutro Abs: 4.7 10*3/uL (ref 1.7–7.7)
Neutrophils Relative %: 52 %
Platelets: 316 10*3/uL (ref 150–400)
RBC: 4.56 MIL/uL (ref 3.87–5.11)
RDW: 14 % (ref 11.5–15.5)
WBC: 8.9 10*3/uL (ref 4.0–10.5)
nRBC: 0 % (ref 0.0–0.2)

## 2020-06-11 LAB — SURGICAL PCR SCREEN
MRSA, PCR: NEGATIVE
Staphylococcus aureus: NEGATIVE

## 2020-06-11 LAB — URINALYSIS, ROUTINE W REFLEX MICROSCOPIC
Bilirubin Urine: NEGATIVE
Glucose, UA: NEGATIVE mg/dL
Hgb urine dipstick: NEGATIVE
Ketones, ur: NEGATIVE mg/dL
Leukocytes,Ua: NEGATIVE
Nitrite: NEGATIVE
Protein, ur: NEGATIVE mg/dL
Specific Gravity, Urine: 1.024 (ref 1.005–1.030)
pH: 5 (ref 5.0–8.0)

## 2020-06-11 LAB — PROTIME-INR
INR: 1.1 (ref 0.8–1.2)
Prothrombin Time: 13.3 seconds (ref 11.4–15.2)

## 2020-06-11 LAB — APTT: aPTT: 42 seconds — ABNORMAL HIGH (ref 24–36)

## 2020-06-11 LAB — SARS CORONAVIRUS 2 (TAT 6-24 HRS): SARS Coronavirus 2: NEGATIVE

## 2020-06-11 NOTE — Progress Notes (Signed)
PCP:  [name], MD Cardiologist:  Denies  EKG:  06/11/20 CXR:  06/11/20 ECHO:  Denies Stress Test:  Denies Cardiac Cath:  denies  Covid test 06/11/20  Patient denies shortness of breath, fever, cough, and chest pain at PAT appointment.  Patient verbalized understanding of instructions provided today at the PAT appointment.  Patient asked to review instructions at home and day of surgery.

## 2020-06-11 NOTE — Telephone Encounter (Signed)
Called Pharm. States  Paediatric nurse and [facility] follow CDC Guidelines. They need a new order to be sent to them ([facility]) in order to fill Rx.  Take 1 tab q6 hours.  Can only do it for 7 days which will be #28 pills.

## 2020-06-11 NOTE — Telephone Encounter (Signed)
Diane from [facility] called she needs prescription for Percocet quantity to be 5-28 1 every 6 hours she stated she's following CDC guidelines the dosage is too high. Call [phone]

## 2020-06-11 NOTE — Telephone Encounter (Signed)
Please see message. Thanks!

## 2020-06-12 ENCOUNTER — Telehealth: Payer: Self-pay | Admitting: Orthopaedic Surgery

## 2020-06-12 ENCOUNTER — Other Ambulatory Visit: Payer: Self-pay | Admitting: Physician Assistant

## 2020-06-12 LAB — EXTRA LAV TOP TUBE

## 2020-06-12 LAB — PREALBUMIN: Prealbumin: 27 mg/dL (ref 17–34)

## 2020-06-12 MED ORDER — BUPIVACAINE LIPOSOME 1.3 % IJ SUSP
20.0000 mL | Freq: Once | INTRAMUSCULAR | Status: DC
  Filled 2020-06-12: qty 20

## 2020-06-12 MED ORDER — TRANEXAMIC ACID 1000 MG/10ML IV SOLN
2000.0000 mg | INTRAVENOUS | Status: DC
  Filled 2020-06-12: qty 20

## 2020-06-12 MED ORDER — OXYCODONE-ACETAMINOPHEN 5-325 MG PO TABS
1.0000 | ORAL_TABLET | Freq: Four times a day (QID) | ORAL | 0 refills | Status: DC | PRN
Start: 2020-06-12 — End: 2020-07-24

## 2020-06-12 NOTE — Telephone Encounter (Signed)
Patient was instructed from pre admissions to call and asked if she needs to continue taking her baby asprin before her surgery on this Monday's surgery date 06/15/20.

## 2020-06-12 NOTE — Telephone Encounter (Signed)
Called patient to advise  °

## 2020-06-12 NOTE — Addendum Note (Signed)
Addended by: Azucena Cecil on: 06/12/2020 08:41 AM   Modules accepted: Orders

## 2020-06-12 NOTE — Telephone Encounter (Signed)
I'm still waiting on this imprivata stuff.  Are you able to send in for me?

## 2020-06-12 NOTE — Telephone Encounter (Signed)
Yes she can continue to take

## 2020-06-14 DIAGNOSIS — M1611 Unilateral primary osteoarthritis, right hip: Secondary | ICD-10-CM

## 2020-06-15 ENCOUNTER — Ambulatory Visit (HOSPITAL_COMMUNITY): Payer: PPO | Admitting: Anesthesiology

## 2020-06-15 ENCOUNTER — Encounter (HOSPITAL_COMMUNITY): Payer: Self-pay | Admitting: Orthopaedic Surgery

## 2020-06-15 ENCOUNTER — Ambulatory Visit (HOSPITAL_COMMUNITY): Payer: PPO

## 2020-06-15 ENCOUNTER — Encounter (HOSPITAL_COMMUNITY)
Admission: RE | Disposition: A | Discharge status: Home / Self Care | Payer: Self-pay | Source: Home / Self Care | Attending: Orthopaedic Surgery

## 2020-06-15 ENCOUNTER — Observation Stay (HOSPITAL_COMMUNITY)
Admission: RE | Admit: 2020-06-15 | Discharge: 2020-06-16 | Disposition: A | Discharge status: Home / Self Care | Payer: PPO | Attending: Orthopaedic Surgery | Admitting: Orthopaedic Surgery

## 2020-06-15 ENCOUNTER — Other Ambulatory Visit: Payer: Self-pay

## 2020-06-15 DIAGNOSIS — Z471 Aftercare following joint replacement surgery: Secondary | ICD-10-CM | POA: Diagnosis not present

## 2020-06-15 DIAGNOSIS — Z79899 Other long term (current) drug therapy: Secondary | ICD-10-CM | POA: Insufficient documentation

## 2020-06-15 DIAGNOSIS — M1611 Unilateral primary osteoarthritis, right hip: Principal | ICD-10-CM | POA: Insufficient documentation

## 2020-06-15 DIAGNOSIS — I10 Essential (primary) hypertension: Secondary | ICD-10-CM | POA: Diagnosis not present

## 2020-06-15 DIAGNOSIS — Z96649 Presence of unspecified artificial hip joint: Secondary | ICD-10-CM

## 2020-06-15 DIAGNOSIS — E559 Vitamin D deficiency, unspecified: Secondary | ICD-10-CM | POA: Diagnosis not present

## 2020-06-15 DIAGNOSIS — Z419 Encounter for procedure for purposes other than remedying health state, unspecified: Secondary | ICD-10-CM

## 2020-06-15 DIAGNOSIS — E785 Hyperlipidemia, unspecified: Secondary | ICD-10-CM | POA: Diagnosis not present

## 2020-06-15 DIAGNOSIS — Z96641 Presence of right artificial hip joint: Secondary | ICD-10-CM | POA: Diagnosis not present

## 2020-06-15 HISTORY — PX: TOTAL HIP ARTHROPLASTY: SHX124

## 2020-06-15 LAB — TYPE AND SCREEN
ABO/RH(D): O POS
Antibody Screen: POSITIVE

## 2020-06-15 SURGERY — ARTHROPLASTY, HIP, TOTAL, ANTERIOR APPROACH
Anesthesia: Spinal | Site: Hip | Laterality: Right

## 2020-06-15 MED ORDER — OXYCODONE HCL 5 MG PO TABS
ORAL_TABLET | ORAL | Status: AC
  Filled 2020-06-15: qty 2

## 2020-06-15 MED ORDER — HYDROCODONE-ACETAMINOPHEN 10-325 MG PO TABS
ORAL_TABLET | ORAL | Status: AC
  Filled 2020-06-15: qty 1

## 2020-06-15 MED ORDER — ACETAMINOPHEN 500 MG PO TABS
1000.0000 mg | ORAL_TABLET | Freq: Four times a day (QID) | ORAL | Status: DC
  Administered 2020-06-15 – 2020-06-16 (×3): 1000 mg via ORAL
  Filled 2020-06-15 (×3): qty 2

## 2020-06-15 MED ORDER — BUPIVACAINE IN DEXTROSE 0.75-8.25 % IT SOLN
INTRATHECAL | Status: DC | PRN
  Administered 2020-06-15: 1.6 mL via INTRATHECAL

## 2020-06-15 MED ORDER — HYDROCODONE-ACETAMINOPHEN 10-325 MG PO TABS: 1.0000 | ORAL_TABLET | Freq: Four times a day (QID) | ORAL | Status: DC | PRN

## 2020-06-15 MED ORDER — IRRISEPT - 450ML BOTTLE WITH 0.05% CHG IN STERILE WATER, USP 99.95% OPTIME
TOPICAL | Status: DC | PRN
  Administered 2020-06-15: 450 mL via TOPICAL

## 2020-06-15 MED ORDER — ONDANSETRON HCL 4 MG/2ML IJ SOLN: 4.0000 mg | Freq: Four times a day (QID) | INTRAMUSCULAR | Status: DC | PRN

## 2020-06-15 MED ORDER — ALUM & MAG HYDROXIDE-SIMETH 200-200-20 MG/5ML PO SUSP: 30.0000 mL | ORAL | Status: DC | PRN

## 2020-06-15 MED ORDER — ONDANSETRON HCL 4 MG PO TABS: 4.0000 mg | ORAL_TABLET | Freq: Four times a day (QID) | ORAL | Status: DC | PRN

## 2020-06-15 MED ORDER — 0.9 % SODIUM CHLORIDE (POUR BTL) OPTIME
TOPICAL | Status: DC | PRN
  Administered 2020-06-15: 1000 mL

## 2020-06-15 MED ORDER — LACTATED RINGERS IV SOLN: INTRAVENOUS | Status: DC

## 2020-06-15 MED ORDER — PROPOFOL 10 MG/ML IV BOLUS
INTRAVENOUS | Status: AC
  Filled 2020-06-15: qty 20

## 2020-06-15 MED ORDER — SORBITOL 70 % SOLN
30.0000 mL | Freq: Every day | Status: DC | PRN
  Filled 2020-06-15: qty 30

## 2020-06-15 MED ORDER — DIPHENHYDRAMINE HCL 12.5 MG/5ML PO ELIX
25.0000 mg | ORAL_SOLUTION | ORAL | Status: DC | PRN
  Filled 2020-06-15: qty 10

## 2020-06-15 MED ORDER — PHENOL 1.4 % MT LIQD: 1.0000 | OROMUCOSAL | Status: DC | PRN

## 2020-06-15 MED ORDER — MENTHOL 3 MG MT LOZG: 1.0000 | LOZENGE | OROMUCOSAL | Status: DC | PRN

## 2020-06-15 MED ORDER — PHENYLEPHRINE 40 MCG/ML (10ML) SYRINGE FOR IV PUSH (FOR BLOOD PRESSURE SUPPORT)
PREFILLED_SYRINGE | INTRAVENOUS | Status: DC | PRN
  Administered 2020-06-15: 80 ug via INTRAVENOUS

## 2020-06-15 MED ORDER — METHOCARBAMOL 750 MG PO TABS
750.0000 mg | ORAL_TABLET | Freq: Three times a day (TID) | ORAL | Status: DC | PRN
  Administered 2020-06-15 – 2020-06-16 (×2): 750 mg via ORAL
  Filled 2020-06-15 (×2): qty 1

## 2020-06-15 MED ORDER — OXYCODONE-ACETAMINOPHEN 5-325 MG PO TABS
1.0000 | ORAL_TABLET | Freq: Four times a day (QID) | ORAL | Status: DC | PRN
  Administered 2020-06-15 – 2020-06-16 (×3): 1 via ORAL
  Filled 2020-06-15: qty 1
  Filled 2020-06-15 (×2): qty 2

## 2020-06-15 MED ORDER — TRANEXAMIC ACID-NACL 1000-0.7 MG/100ML-% IV SOLN
1000.0000 mg | INTRAVENOUS | Status: AC
  Administered 2020-06-15: 1000 mg via INTRAVENOUS
  Filled 2020-06-15: qty 100

## 2020-06-15 MED ORDER — ACETAMINOPHEN 10 MG/ML IV SOLN
INTRAVENOUS | Status: AC
  Filled 2020-06-15: qty 100

## 2020-06-15 MED ORDER — VANCOMYCIN HCL 1000 MG IV SOLR
INTRAVENOUS | Status: AC
  Filled 2020-06-15: qty 1000

## 2020-06-15 MED ORDER — ONDANSETRON HCL 4 MG/2ML IJ SOLN
INTRAMUSCULAR | Status: DC | PRN
  Administered 2020-06-15: 4 mg via INTRAVENOUS

## 2020-06-15 MED ORDER — VANCOMYCIN HCL 1 G IV SOLR
INTRAVENOUS | Status: DC | PRN
  Administered 2020-06-15: 1000 mg

## 2020-06-15 MED ORDER — SODIUM CHLORIDE 0.9 % IR SOLN
Status: DC | PRN
  Administered 2020-06-15: 3000 mL

## 2020-06-15 MED ORDER — FENTANYL CITRATE (PF) 250 MCG/5ML IJ SOLN
INTRAMUSCULAR | Status: AC
  Filled 2020-06-15: qty 5

## 2020-06-15 MED ORDER — BUPIVACAINE-EPINEPHRINE (PF) 0.25% -1:200000 IJ SOLN
INTRAMUSCULAR | Status: AC
  Filled 2020-06-15: qty 20

## 2020-06-15 MED ORDER — METOCLOPRAMIDE HCL 5 MG PO TABS: 5.0000 mg | ORAL_TABLET | Freq: Three times a day (TID) | ORAL | Status: DC | PRN

## 2020-06-15 MED ORDER — LACTATED RINGERS IV BOLUS: 500.0000 mL | Freq: Once | INTRAVENOUS | Status: DC

## 2020-06-15 MED ORDER — SODIUM CHLORIDE 0.9% FLUSH
INTRAVENOUS | Status: DC | PRN
  Administered 2020-06-15: 20 mL via INTRAVENOUS

## 2020-06-15 MED ORDER — POLYETHYLENE GLYCOL 3350 17 G PO PACK: 17.0000 g | PACK | Freq: Every day | ORAL | Status: DC | PRN

## 2020-06-15 MED ORDER — DEXAMETHASONE SODIUM PHOSPHATE 10 MG/ML IJ SOLN
INTRAMUSCULAR | Status: AC
  Filled 2020-06-15: qty 1

## 2020-06-15 MED ORDER — DEXAMETHASONE SODIUM PHOSPHATE 10 MG/ML IJ SOLN
10.0000 mg | Freq: Once | INTRAMUSCULAR | Status: DC
  Filled 2020-06-15: qty 1

## 2020-06-15 MED ORDER — ONDANSETRON HCL 4 MG/2ML IJ SOLN
INTRAMUSCULAR | Status: AC
  Filled 2020-06-15: qty 2

## 2020-06-15 MED ORDER — ACETAMINOPHEN 500 MG PO TABS
ORAL_TABLET | ORAL | Status: AC
  Filled 2020-06-15: qty 2

## 2020-06-15 MED ORDER — PHENYLEPHRINE 40 MCG/ML (10ML) SYRINGE FOR IV PUSH (FOR BLOOD PRESSURE SUPPORT)
PREFILLED_SYRINGE | INTRAVENOUS | Status: AC
  Filled 2020-06-15: qty 10

## 2020-06-15 MED ORDER — MIDAZOLAM HCL 5 MG/5ML IJ SOLN
INTRAMUSCULAR | Status: DC | PRN
  Administered 2020-06-15: 2 mg via INTRAVENOUS

## 2020-06-15 MED ORDER — AMLODIPINE BESYLATE 5 MG PO TABS: 5.0000 mg | ORAL_TABLET | Freq: Every day | ORAL | Status: DC

## 2020-06-15 MED ORDER — ASPIRIN 81 MG PO CHEW
81.0000 mg | CHEWABLE_TABLET | Freq: Two times a day (BID) | ORAL | Status: DC
  Administered 2020-06-15: 81 mg via ORAL
  Filled 2020-06-15: qty 1

## 2020-06-15 MED ORDER — TRANEXAMIC ACID 1000 MG/10ML IV SOLN
INTRAVENOUS | Status: DC | PRN
  Administered 2020-06-15: 2000 mg via TOPICAL

## 2020-06-15 MED ORDER — FLUTICASONE PROPIONATE 50 MCG/ACT NA SUSP
2.0000 | Freq: Every day | NASAL | Status: DC
  Filled 2020-06-15: qty 16

## 2020-06-15 MED ORDER — OXYCODONE HCL 5 MG PO TABS: 10.0000 mg | ORAL_TABLET | ORAL | Status: DC | PRN

## 2020-06-15 MED ORDER — CHLORHEXIDINE GLUCONATE 0.12 % MT SOLN
OROMUCOSAL | Status: AC
  Administered 2020-06-15: 15 mL
  Filled 2020-06-15: qty 15

## 2020-06-15 MED ORDER — ACETAMINOPHEN 325 MG PO TABS: 325.0000 mg | ORAL_TABLET | Freq: Four times a day (QID) | ORAL | Status: DC | PRN

## 2020-06-15 MED ORDER — LACTATED RINGERS IV BOLUS
250.0000 mL | Freq: Once | INTRAVENOUS | Status: AC
  Administered 2020-06-15: 250 mL via INTRAVENOUS

## 2020-06-15 MED ORDER — OXYCODONE HCL ER 10 MG PO T12A: 10.0000 mg | EXTENDED_RELEASE_TABLET | Freq: Two times a day (BID) | ORAL | Status: DC

## 2020-06-15 MED ORDER — DOCUSATE SODIUM 100 MG PO CAPS
100.0000 mg | ORAL_CAPSULE | Freq: Two times a day (BID) | ORAL | Status: DC
  Administered 2020-06-15: 100 mg via ORAL
  Filled 2020-06-15: qty 1

## 2020-06-15 MED ORDER — BUPIVACAINE LIPOSOME 1.3 % IJ SUSP
INTRAMUSCULAR | Status: DC | PRN
  Administered 2020-06-15: 20 mL

## 2020-06-15 MED ORDER — FAMOTIDINE 20 MG PO TABS: 20.0000 mg | ORAL_TABLET | Freq: Every day | ORAL | Status: DC

## 2020-06-15 MED ORDER — CEFAZOLIN SODIUM-DEXTROSE 2-4 GM/100ML-% IV SOLN
2.0000 g | Freq: Four times a day (QID) | INTRAVENOUS | Status: AC
  Administered 2020-06-15 (×2): 2 g via INTRAVENOUS
  Filled 2020-06-15 (×2): qty 100

## 2020-06-15 MED ORDER — METOCLOPRAMIDE HCL 5 MG/ML IJ SOLN: 5.0000 mg | Freq: Three times a day (TID) | INTRAMUSCULAR | Status: DC | PRN

## 2020-06-15 MED ORDER — MAGNESIUM CITRATE PO SOLN: 1.0000 | Freq: Once | ORAL | Status: DC | PRN

## 2020-06-15 MED ORDER — OXYCODONE HCL 5 MG PO TABS: 5.0000 mg | ORAL_TABLET | ORAL | Status: DC | PRN

## 2020-06-15 MED ORDER — HYDROMORPHONE HCL 1 MG/ML IJ SOLN
0.5000 mg | INTRAMUSCULAR | Status: DC | PRN
  Administered 2020-06-15: 0.5 mg via INTRAVENOUS
  Filled 2020-06-15: qty 1

## 2020-06-15 MED ORDER — MIDAZOLAM HCL 2 MG/2ML IJ SOLN
INTRAMUSCULAR | Status: AC
  Filled 2020-06-15: qty 2

## 2020-06-15 MED ORDER — DEXAMETHASONE SODIUM PHOSPHATE 10 MG/ML IJ SOLN
INTRAMUSCULAR | Status: DC | PRN
  Administered 2020-06-15: 5 mg via INTRAVENOUS

## 2020-06-15 MED ORDER — CEFAZOLIN SODIUM-DEXTROSE 2-4 GM/100ML-% IV SOLN
2.0000 g | INTRAVENOUS | Status: AC
  Administered 2020-06-15: 2 g via INTRAVENOUS
  Filled 2020-06-15: qty 100

## 2020-06-15 MED ORDER — SODIUM CHLORIDE 0.9 % IV SOLN: INTRAVENOUS | Status: DC

## 2020-06-15 MED ORDER — ACETAMINOPHEN 10 MG/ML IV SOLN
INTRAVENOUS | Status: DC | PRN
  Administered 2020-06-15: 1000 mg via INTRAVENOUS

## 2020-06-15 MED ORDER — PROPOFOL 500 MG/50ML IV EMUL
INTRAVENOUS | Status: DC | PRN
  Administered 2020-06-15: 25 ug/kg/min via INTRAVENOUS

## 2020-06-15 MED ORDER — LIDOCAINE HCL (CARDIAC) PF 100 MG/5ML IV SOSY
PREFILLED_SYRINGE | INTRAVENOUS | Status: DC | PRN
  Administered 2020-06-15: 40 mg via INTRATRACHEAL

## 2020-06-15 MED ORDER — PROPOFOL 10 MG/ML IV BOLUS
INTRAVENOUS | Status: DC | PRN
  Administered 2020-06-15 (×2): 40 mg via INTRAVENOUS

## 2020-06-15 MED ORDER — BUPIVACAINE-EPINEPHRINE 0.25% -1:200000 IJ SOLN
INTRAMUSCULAR | Status: DC | PRN
  Administered 2020-06-15: 20 mL

## 2020-06-15 SURGICAL SUPPLY — 61 items
BAG DECANTER FOR FLEXI CONT (MISCELLANEOUS) ×3 IMPLANT
CELLS DAT CNTRL 66122 CELL SVR (MISCELLANEOUS) IMPLANT
COVER PERINEAL POST (MISCELLANEOUS) ×3 IMPLANT
COVER SURGICAL LIGHT HANDLE (MISCELLANEOUS) ×3 IMPLANT
COVER WAND RF STERILE (DRAPES) ×3 IMPLANT
DERMABOND ADVANCED (GAUZE/BANDAGES/DRESSINGS) ×2
DERMABOND ADVANCED .7 DNX12 (GAUZE/BANDAGES/DRESSINGS) ×1 IMPLANT
DRAPE C-ARM 42X72 X-RAY (DRAPES) ×3 IMPLANT
DRAPE POUCH INSTRU U-SHP 10X18 (DRAPES) ×3 IMPLANT
DRAPE STERI IOBAN 125X83 (DRAPES) ×3 IMPLANT
DRAPE U-SHAPE 47X51 STRL (DRAPES) ×6 IMPLANT
DRSG AQUACEL AG ADV 3.5X10 (GAUZE/BANDAGES/DRESSINGS) ×3 IMPLANT
DURAPREP 26ML APPLICATOR (WOUND CARE) ×6 IMPLANT
ELECT BLADE 4.0 EZ CLEAN MEGAD (MISCELLANEOUS) ×3
ELECT REM PT RETURN 9FT ADLT (ELECTROSURGICAL) ×3
ELECTRODE BLDE 4.0 EZ CLN MEGD (MISCELLANEOUS) ×1 IMPLANT
ELECTRODE REM PT RTRN 9FT ADLT (ELECTROSURGICAL) ×1 IMPLANT
GLOVE BIOGEL PI IND STRL 7.0 (GLOVE) ×1 IMPLANT
GLOVE BIOGEL PI INDICATOR 7.0 (GLOVE) ×2
GLOVE ECLIPSE 7.0 STRL STRAW (GLOVE) ×6 IMPLANT
GLOVE SKINSENSE NS SZ7.5 (GLOVE) ×2
GLOVE SKINSENSE STRL SZ7.5 (GLOVE) ×1 IMPLANT
GLOVE SURG SYN 7.5  E (GLOVE) ×12
GLOVE SURG SYN 7.5 E (GLOVE) ×4 IMPLANT
GOWN STRL REIN XL XLG (GOWN DISPOSABLE) ×3 IMPLANT
GOWN STRL REUS W/ TWL LRG LVL3 (GOWN DISPOSABLE) IMPLANT
GOWN STRL REUS W/ TWL XL LVL3 (GOWN DISPOSABLE) ×1 IMPLANT
GOWN STRL REUS W/TWL LRG LVL3 (GOWN DISPOSABLE)
GOWN STRL REUS W/TWL XL LVL3 (GOWN DISPOSABLE) ×3
HANDPIECE INTERPULSE COAX TIP (DISPOSABLE) ×3
HEAD CERAMIC 36 PLUS5 (Hips) ×3 IMPLANT
HOOD PEEL AWAY FLYTE STAYCOOL (MISCELLANEOUS) ×6 IMPLANT
IV NS IRRIG 3000ML ARTHROMATIC (IV SOLUTION) ×3 IMPLANT
JET LAVAGE IRRISEPT WOUND (IRRIGATION / IRRIGATOR) ×6
KIT BASIN OR (CUSTOM PROCEDURE TRAY) ×3 IMPLANT
LAVAGE JET IRRISEPT WOUND (IRRIGATION / IRRIGATOR) ×2 IMPLANT
LINER NEUTRAL 52X36MM PLUS 4 (Liner) ×3 IMPLANT
MARKER SKIN DUAL TIP RULER LAB (MISCELLANEOUS) ×3 IMPLANT
NEEDLE SPNL 18GX3.5 QUINCKE PK (NEEDLE) ×3 IMPLANT
PACK TOTAL JOINT (CUSTOM PROCEDURE TRAY) ×3 IMPLANT
PACK UNIVERSAL I (CUSTOM PROCEDURE TRAY) ×3 IMPLANT
PIN SECTOR W/GRIP ACE CUP 52MM (Hips) ×3 IMPLANT
RTRCTR WOUND ALEXIS 18CM MED (MISCELLANEOUS)
SAW OSC TIP CART 19.5X105X1.3 (SAW) ×3 IMPLANT
SCREW 6.5MMX25MM (Screw) ×3 IMPLANT
SET HNDPC FAN SPRY TIP SCT (DISPOSABLE) ×1 IMPLANT
STAPLER VISISTAT 35W (STAPLE) IMPLANT
STEM FEMORAL SZ 6MM STD ACTIS (Stem) ×3 IMPLANT
SUT ETHIBOND 2 V 37 (SUTURE) ×3 IMPLANT
SUT VIC AB 0 CT1 27 (SUTURE) ×3
SUT VIC AB 0 CT1 27XBRD ANBCTR (SUTURE) ×1 IMPLANT
SUT VIC AB 1 CTX 36 (SUTURE) ×3
SUT VIC AB 1 CTX36XBRD ANBCTR (SUTURE) ×1 IMPLANT
SUT VIC AB 2-0 CT1 27 (SUTURE) ×9
SUT VIC AB 2-0 CT1 TAPERPNT 27 (SUTURE) ×3 IMPLANT
SYR 50ML LL SCALE MARK (SYRINGE) ×3 IMPLANT
TOWEL GREEN STERILE (TOWEL DISPOSABLE) ×3 IMPLANT
TRAY CATH 16FR W/PLASTIC CATH (SET/KITS/TRAYS/PACK) IMPLANT
TRAY FOLEY W/BAG SLVR 16FR (SET/KITS/TRAYS/PACK) ×3
TRAY FOLEY W/BAG SLVR 16FR ST (SET/KITS/TRAYS/PACK) ×1 IMPLANT
YANKAUER SUCT BULB TIP NO VENT (SUCTIONS) ×3 IMPLANT

## 2020-06-15 NOTE — Transfer of Care (Signed)
Immediate Anesthesia Transfer of Care Note  Patient: Lori Cook  Procedure(s) Performed: RIGHT TOTAL HIP ARTHROPLASTY ANTERIOR APPROACH (Right Hip)  Patient Location: PACU  Anesthesia Type:Spinal  Level of Consciousness: awake, alert , oriented, patient cooperative and responds to stimulation  Airway & Oxygen Therapy: Patient Spontanous Breathing  Post-op Assessment: Report given to RN, Post -op Vital signs reviewed and stable and Patient moving all extremities X 4  Post vital signs: Reviewed and stable  Last Vitals:  Vitals Value Taken Time  BP 123/78 06/15/20 1156  Temp    Pulse 49 06/15/20 1158  Resp 14 06/15/20 1158  SpO2 100 % 06/15/20 1158  Vitals shown include unvalidated device data.  Last Pain:  Vitals:   06/15/20 0847  TempSrc:   PainSc: 0-No pain      Patients Stated Pain Goal: 3 (95/28/41 3244)  Complications: No complications documented.

## 2020-06-15 NOTE — Anesthesia Postprocedure Evaluation (Signed)
Anesthesia Post Note  Patient: Lori Cook  Procedure(s) Performed: RIGHT TOTAL HIP ARTHROPLASTY ANTERIOR APPROACH (Right Hip)     Patient location during evaluation: PACU Anesthesia Type: Spinal Level of consciousness: sedated Pain management: pain level controlled Vital Signs Assessment: post-procedure vital signs reviewed and stable Respiratory status: spontaneous breathing and respiratory function stable Cardiovascular status: stable Postop Assessment: no apparent nausea or vomiting Anesthetic complications: no   No complications documented.  Last Vitals:  Vitals:   06/15/20 1323 06/15/20 1421  BP: 137/76 (!) 153/71  Pulse: (!) 58 64  Resp: 15 18  Temp:  36.6 C  SpO2: 100% 100%    Last Pain:  Vitals:   06/15/20 1529  TempSrc:   PainSc: 5                  Candra R Camryn Quesinberry

## 2020-06-15 NOTE — Progress Notes (Signed)
RNCM spoke with patient prior to her surgery today to discuss same day discharge/Ambulatory surgery status. She has had a previous hip replacement earlier in the year. She has all needed DME and is aware of HHPT ordered for after discharge home. She has family that will be assisting at home after discharge. She asked questions related to post-op and reviewed again all post-op care instructions. Please call if any further needs. Jamse Arn, RN Case Manager [phone].

## 2020-06-15 NOTE — Discharge Summary (Signed)
Patient ID: Lori Cook MRN: [id number] DOB/AGE: Aug 27, 1951 69 y.o.  Admit date: 06/15/2020 Discharge date: 06/15/2020  Admission Diagnoses:  Primary osteoarthritis of right hip  Discharge Diagnoses:  Principal Problem:   Primary osteoarthritis of right hip Active Problems:   Status post total replacement of right hip   Past Medical History:  Diagnosis Date  . Allergy   . Arthritis   . Blood transfusion without reported diagnosis   . Cholelithiasis    large stone detected by CT 01/2016  . GERD (gastroesophageal reflux disease)   . Hyperlipidemia   . Hypertension   . Nephrolithiasis    104mm kidney stone L by CT 01/2016  . 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 female who was admitted 06/15/2020 with a diagnosis of Primary osteoarthritis of right hip and went to the operating room on 06/15/2020 and underwent the above named procedures.    She was given perioperative antibiotics:  Anti-infectives (From admission, onward)   Start     Dose/Rate Route Frequency Ordered Stop   06/15/20 1600  ceFAZolin (ANCEF) IVPB 2g/100 mL premix        2 g 200 mL/hr over 30 Minutes Intravenous Every 6 hours 06/15/20 1417 06/16/20 0359   06/15/20 1047  vancomycin (VANCOCIN) powder  Status:  Discontinued          As needed 06/15/20 1047 06/15/20 1150   06/15/20 0845  ceFAZolin (ANCEF) IVPB 2g/100 mL premix        2 g 200 mL/hr over 30 Minutes Intravenous On call to O.R. 06/15/20 6063 06/15/20 1012    .  She was given sequential compression devices, early ambulation, and appropriate chemoprophylaxis for DVT prophylaxis.  She benefited maximally from the hospital stay and there were no complications.    Recent vital signs:  Vitals:   06/15/20 1323  06/15/20 1421  BP: 137/76 (!) 153/71  Pulse: (!) 58 64  Resp: 15 18  Temp:  97.9 F (36.6 C)  SpO2: 100% 100%    Recent laboratory studies:  Lab Results  Component Value Date   HGB 13.2 06/11/2020   HGB 12.5 11/18/2019   HGB 13.3 08/12/2017   Lab Results  Component Value Date   WBC 8.9 06/11/2020   PLT 316 06/11/2020   Lab Results  Component Value Date   INR 1.1 06/11/2020   Lab Results  Component Value Date   NA 139 06/11/2020   K 4.0 06/11/2020   CL 105 06/11/2020   CO2 24 06/11/2020   BUN 18 06/11/2020   CREATININE 0.80 06/11/2020   GLUCOSE 102 (H) 06/11/2020    Discharge Medications:   Allergies as of 06/15/2020      Reactions   Tramadol Nausea And Vomiting   Codeine Other (See Comments)   Headache   Oxycodone Palpitations      Medication List    TAKE these medications   acetaminophen 500 MG tablet Commonly known as: TYLENOL Take 1,000 mg by mouth every 6 (six) hours as needed for moderate pain.   alendronate 70 MG tablet Commonly known as: Fosamax Take 1 tablet (70 mg total) by mouth once a week. Take with a full glass of water on an empty stomach.   amLODipine 5 MG  tablet Commonly known as: NORVASC Take 1 tablet (5 mg total) by mouth daily.   amoxicillin 500 MG capsule Commonly known as: AMOXIL Take 4 caps one hour prior to dental work What changed:   how much to take  how to take this  when to take this  additional instructions   aspirin EC 81 MG tablet Take 1 tablet (81 mg total) by mouth in the morning and at bedtime.   azelastine 0.1 % nasal spray Commonly known as: ASTELIN Place 2 sprays into both nostrils 2 (two) times daily.   celecoxib 200 MG capsule Commonly known as: CeleBREX Take 1 capsule (200 mg total) by mouth 2 (two) times daily.   docusate sodium 100 MG capsule Commonly known as: Colace Take 1 capsule (100 mg total) by mouth daily as needed.   famotidine 20 MG tablet Commonly known as: PEPCID Take 20 mg by  mouth daily.   fluticasone 50 MCG/ACT nasal spray Commonly known as: FLONASE Place 2 sprays into both nostrils daily.   JUICE PLUS FIBRE PO Take 2 tablets by mouth daily.   methocarbamol 750 MG tablet Commonly known as: Robaxin-750 Take 1 tablet (750 mg total) by mouth 3 (three) times daily as needed for muscle spasms.   naproxen sodium 220 MG tablet Commonly known as: ALEVE Take 440 mg by mouth 2 (two) times daily as needed (pain).   ondansetron 4 MG tablet Commonly known as: Zofran Take 1 tablet (4 mg total) by mouth every 8 (eight) hours as needed for nausea or vomiting.   OPTIVE 0.5-0.9 % ophthalmic solution Generic drug: carboxymethylcellul-glycerin Place 1-2 drops into both eyes 3 (three) times daily as needed for dry eyes (dry/irritated eyes.).   oxyCODONE-acetaminophen 5-325 MG tablet Commonly known as: Percocet Take 1-2 tablets by mouth every 6 (six) hours as needed.   oxyCODONE-acetaminophen 5-325 MG tablet Commonly known as: Percocet Take 1 tablet by mouth every 6 (six) hours as needed for severe pain.   simvastatin 40 MG tablet Commonly known as: ZOCOR Take 1 tablet by mouth once daily with breakfast What changed:   how much to take  how to take this  when to take this  additional instructions            Durable Medical Equipment  (From admission, onward)         Start     Ordered   06/15/20 1418  DME Walker rolling  Once       Question:  Patient needs a walker to treat with the following condition  Answer:  History of hip replacement   06/15/20 1417   06/15/20 1418  DME 3 n 1  Once        06/15/20 1417   06/15/20 1418  DME Bedside commode  Once       Question:  Patient needs a bedside commode to treat with the following condition  Answer:  History of hip replacement   06/15/20 1417          Diagnostic Studies: DG Chest 2 View  Result Date: 06/11/2020 CLINICAL DATA:  Preoperative evaluation for RIGHT total hip arthroplasty on  06/15/2020, history GERD, hypertension, stroke, former smoker EXAM: CHEST - 2 VIEW COMPARISON:  11/21/2019 FINDINGS: Normal heart size, mediastinal contours, and pulmonary vascularity. Lungs clear. No pulmonary infiltrate, pleural effusion, or pneumothorax. Biconvex thoracolumbar scoliosis. IMPRESSION: No acute abnormalities. Electronically Signed   By: Lavonia Dana M.D.   On: 06/11/2020 14:14   DG Pelvis Portable  Result  Date: 06/15/2020 CLINICAL DATA:  69 year old female status post right hip arthroplasty today. EXAM: PORTABLE PELVIS 1-2 VIEWS COMPARISON:  Intraoperative images 10 22 hours today. Pelvis series 05/19/2020. FINDINGS: Portable AP supine view at 1230 hours. Pre-existing left total hip arthroplasty. New right total hip arthroplasty hardware appears intact with normal AP alignment. No unexpected osseous changes. Visible pelvis remains intact. Mild postoperative gas in the soft tissues around the right hip. IMPRESSION: New right total hip arthroplasty with no adverse features. Electronically Signed   By: Genevie Ann M.D.   On: 06/15/2020 13:10   DG C-Arm 1-60 Min  Result Date: 06/15/2020 CLINICAL DATA:  Intraoperative imaging for right hip replacement. EXAM: OPERATIVE RIGHT HIP (WITH PELVIS IF PERFORMED) 3 VIEWS TECHNIQUE: Fluoroscopic spot image(s) were submitted for interpretation post-operatively. COMPARISON:  None. FINDINGS: First image demonstrates advanced right hip osteoarthritis. On the second 2 images, a new total hip replacement is in place on the right. No acute abnormality is identified. IMPRESSION: Intraoperative imaging for right hip replacement.  No acute finding. Electronically Signed   By: Inge Rise M.D.   On: 06/15/2020 11:57   DG HIP OPERATIVE UNILAT W OR W/O PELVIS RIGHT  Result Date: 06/15/2020 CLINICAL DATA:  Intraoperative imaging for right hip replacement. EXAM: OPERATIVE RIGHT HIP (WITH PELVIS IF PERFORMED) 3 VIEWS TECHNIQUE: Fluoroscopic spot image(s) were  submitted for interpretation post-operatively. COMPARISON:  None. FINDINGS: First image demonstrates advanced right hip osteoarthritis. On the second 2 images, a new total hip replacement is in place on the right. No acute abnormality is identified. IMPRESSION: Intraoperative imaging for right hip replacement.  No acute finding. Electronically Signed   By: Inge Rise M.D.   On: 06/15/2020 11:57   XR Pelvis 1-2 Views  Result Date: 05/25/2020 Stable total hip replacement.  End stage right hip DJD   Disposition: Discharge disposition: 01-Home or Self Care       Discharge Instructions    Call MD / Call 911   Complete by: As directed    If you experience chest pain or shortness of breath, CALL 911 and be transported to the hospital emergency room.  If you develope a fever above 101.5 F, pus (white drainage) or increased drainage or redness at the wound, or calf pain, call your surgeon's office.   Constipation Prevention   Complete by: As directed    Drink plenty of fluids.  Prune juice may be helpful.  You may use a stool softener, such as Colace (over the counter) 100 mg twice a day.  Use MiraLax (over the counter) for constipation as needed.   Driving restrictions   Complete by: As directed    No driving while taking narcotic pain meds.   Increase activity slowly as tolerated   Complete by: As directed        Follow-up Information    Leandrew Koyanagi, MD In 2 weeks.   Specialty: Orthopedic Surgery Why: For suture removal, For wound re-check Contact information: [place] [id number] [phone]                Signed: Eduard Roux 06/15/2020, 5:37 PM

## 2020-06-15 NOTE — Evaluation (Signed)
Physical Therapy Evaluation Patient Details Name: Lori Cook MRN: [id number] DOB: Apr 17, 1951 Today's Date: 06/15/2020   History of Present Illness  Pt is a 69 y/o female s/p R THA, direct anterior. PMH includes HTN, CVA,  and L THA.   Clinical Impression  Pt is s/p surgery above with deficits below. Requiring min to min guard A to stand and ambulate to bathroom and back to bed. Upon toileting, pt reporting increased wooziness and broke out into a sweat. Required prolonged seated rest prior to return to bed. Anticipate pt will progress well once symptoms improve. Will continue to follow acutely to maximize functional mobility independence and safety.     Follow Up Recommendations Follow surgeon's recommendation for DC plan and follow-up therapies;Supervision for mobility/OOB    Equipment Recommendations  None recommended by PT    Recommendations for Other Services       Precautions / Restrictions Precautions Precautions: None Restrictions Weight Bearing Restrictions: Yes RLE Weight Bearing: Weight bearing as tolerated      Mobility  Bed Mobility Overal bed mobility: Needs Assistance Bed Mobility: Supine to Sit;Sit to Supine     Supine to sit: Min assist Sit to supine: Min assist   General bed mobility comments: Min A for RLE assist throughout bed mobility.   Transfers Overall transfer level: Needs assistance Equipment used: Rolling walker (2 wheeled) Transfers: Sit to/from Stand Sit to Stand: Min assist         General transfer comment: Min A for steadying assist to stand.   Ambulation/Gait Ambulation/Gait assistance: Min guard Gait Distance (Feet): 15 Feet Assistive device: Rolling walker (2 wheeled) Gait Pattern/deviations: Step-to pattern;Decreased step length - right;Decreased step length - left Gait velocity: Decreased    General Gait Details: Decreased coordination when taking steps with RLE secondary to decreased sensation. Ambulated to the  bathroom. After toileting, pt became hot and sweaty and required prolonged seated rest on toilet. Was able to ambuate back to bed with min guard A, however, reporting increased wooziness. Further mobility deferred.   Stairs            Wheelchair Mobility    Modified Rankin (Stroke Patients Only)       Balance Overall balance assessment: Needs assistance Sitting-balance support: No upper extremity supported;Feet supported Sitting balance-Leahy Scale: Fair     Standing balance support: Bilateral upper extremity supported;During functional activity Standing balance-Leahy Scale: Poor Standing balance comment: Reliant on BUE support                              Pertinent Vitals/Pain Pain Assessment: Faces Faces Pain Scale: Hurts little more Pain Location: R hip  Pain Descriptors / Indicators: Aching;Operative site guarding Pain Intervention(s): Limited activity within patient's tolerance;Monitored during session;Repositioned    Home Living Family/patient expects to be discharged to:: Private residence Living Arrangements: Children Available Help at Discharge: Family;Available 24 hours/Cook Type of Home: House Home Access: Stairs to enter Entrance Stairs-Rails: Right;Left;Can reach both Entrance Stairs-Number of Steps: 2 Home Layout: One level Home Equipment: Walker - 2 wheels;Bedside commode;Shower seat      Prior Function Level of Independence: Independent               Hand Dominance        Extremity/Trunk Assessment   Upper Extremity Assessment Upper Extremity Assessment: Defer to OT evaluation    Lower Extremity Assessment Lower Extremity Assessment: RLE deficits/detail RLE Deficits / Details: Reports decreased sensation  throughout RLE. Other deficits consistent with post op pain and weakness.     Cervical / Trunk Assessment Cervical / Trunk Assessment: Normal  Communication   Communication: No difficulties  Cognition  Arousal/Alertness: Awake/alert Behavior During Therapy: WFL for tasks assessed/performed Overall Cognitive Status: Within Functional Limits for tasks assessed                                        General Comments General comments (skin integrity, edema, etc.): Pt's daughter present during session     Exercises     Assessment/Plan    PT Assessment Patient needs continued PT services  PT Problem List Decreased strength;Decreased range of motion;Decreased balance;Decreased activity tolerance;Decreased mobility;Decreased coordination;Pain       PT Treatment Interventions DME instruction;Gait training;Stair training;Therapeutic activities;Functional mobility training;Balance training;Therapeutic exercise;Patient/family education    PT Goals (Current goals can be found in the Care Plan section)  Acute Rehab PT Goals Patient Stated Goal: to go home PT Goal Formulation: With patient Time For Goal Achievement: 06/29/20 Potential to Achieve Goals: Good    Frequency 7X/week   Barriers to discharge        Co-evaluation               AM-PAC PT "6 Clicks" Mobility  Outcome Measure Help needed turning from your back to your side while in a flat bed without using bedrails?: None Help needed moving from lying on your back to sitting on the side of a flat bed without using bedrails?: A Little Help needed moving to and from a bed to a chair (including a wheelchair)?: A Little Help needed standing up from a chair using your arms (e.g., wheelchair or bedside chair)?: A Little Help needed to walk in hospital room?: A Little Help needed climbing 3-5 steps with a railing? : A Lot 6 Click Score: 18    End of Session Equipment Utilized During Treatment: Gait belt Activity Tolerance: Treatment limited secondary to medical complications (Comment) (wooziness) Patient left: in bed;with call bell/phone within reach;with family/visitor present Nurse Communication: Mobility  status PT Visit Diagnosis: Other abnormalities of gait and mobility (R26.89);Difficulty in walking, not elsewhere classified (R26.2)    Time: 5809-9833 PT Time Calculation (min) (ACUTE ONLY): 24 min   Charges:   PT Evaluation $PT Eval Low Complexity: 1 Low PT Treatments $Therapeutic Activity: 8-22 mins        Lou Miner, DPT  Acute Rehabilitation Services  Pager: [phone] Office: [phone]   Rudean Hitt 06/15/2020, 4:24 PM

## 2020-06-15 NOTE — Op Note (Signed)
RIGHT TOTAL HIP ARTHROPLASTY ANTERIOR APPROACH  Procedure Note Lori Cook   [id number]  Pre-op Diagnosis: right hip osteoarthritis     Post-op Diagnosis: same   Operative Procedures  1. Total hip replacement; Right hip; uncemented [id number]   Personnel  Surgeon(s): Leandrew Koyanagi, MD  Assist: Madalyn Rob, PA-C; necessary for the timely completion of procedure and due to complexity of procedure.   Anesthesia: spinal  Prosthesis: Depuy Acetabulum: Pinnacle 52 mm Femur: Actis 6 STD Head: 36 mm size: +5 Liner: +4 neutral Bearing Type: ceramic on poly  Total Hip Arthroplasty (Anterior Approach) Op Note:  After informed consent was obtained and the operative extremity marked in the holding area, the patient was brought back to the operating room and placed supine on the HANA table. Next, the operative extremity was prepped and draped in normal sterile fashion. Surgical timeout occurred verifying patient identification, surgical site, surgical procedure and administration of antibiotics.  A modified anterior Smith-Peterson approach to the hip was performed, using the interval between tensor fascia lata and sartorius.  Dissection was carried bluntly down onto the anterior hip capsule. The lateral femoral circumflex vessels were identified and coagulated. A capsulotomy was performed and the capsular flaps tagged for later repair.  The neck osteotomy was performed. The femoral head was removed, the acetabular rim was cleared of soft tissue and attention was turned to reaming the acetabulum.  Sequential reaming was performed under fluoroscopic guidance. We reamed to a size 51 mm, and then impacted the acetabular shell. A 25 mm cancellous screw was placed through the shell for added fixation.  The liner was then placed after irrigation and attention turned to the femur.  After placing the femoral hook, the leg was taken to externally rotated, extended and adducted position taking  care to perform soft tissue releases to allow for adequate mobilization of the femur. Soft tissue was cleared from the shoulder of the greater trochanter and the hook elevator used to improve exposure of the proximal femur. Sequential broaching performed up to a size 6. Trial neck and head were placed. The leg was brought back up to neutral and the construct reduced.  Antibiotic irrigation was placed in the surgical wound and kept for at least 1 minute.  The position and sizing of components, offset and leg lengths were checked using fluoroscopy. Stability of the construct was checked in extension and external rotation without any subluxation or impingement of prosthesis. We dislocated the prosthesis, dropped the leg back into position, removed trial components, and irrigated copiously. The final stem and head was then placed, the leg brought back up, the system reduced and fluoroscopy used to verify positioning.  We irrigated, obtained hemostasis and closed the capsule using #2 ethibond suture.  One gram of vancomycin powder was placed in the surgical bed. A dilute solution of 20 cc of normal saline, 1.3% exparel, 0.25% bupivacaine was injected in the soft tissues.  One gram of topical tranexamic acid was injected into the joint.  The fascia was closed with #1 vicryl plus, the deep fat layer was closed with 0 vicryl, the subcutaneous layers closed with 2.0 Vicryl Plus and the skin closed with 2.0 nylon and dermabond. A sterile dressing was applied. The patient was awakened in the operating room and taken to recovery in stable condition.  All sponge, needle, and instrument counts were correct at the end of the case.   Position: supine  Complications: see description of procedure.  Time Out: performed  Drains/Packing: none  Estimated blood loss: see anesthesia record  Returned to Recovery Room: in good condition.   Antibiotics: yes   Mechanical VTE (DVT) Prophylaxis: sequential compression devices,  TED thigh-high  Chemical VTE (DVT) Prophylaxis: aspirin   Fluid Replacement: see anesthesia record  Specimens Removed: 1 to pathology   Sponge and Instrument Count Correct? yes   PACU: portable radiograph - low AP   Plan/RTC: Return in 2 weeks for staple removal. Weight Bearing/Load Lower Extremity: full  Hip precautions: none Suture Removal: 2 weeks   N. Eduard Roux, MD [facility] 11:29 AM   Implant Name Type Inv. Item Serial No. Manufacturer Lot No. LRB No. Used Action  PIN SECTOR W/GRIP ACE CUP 52MM - [id number] Hips PIN SECTOR W/GRIP ACE CUP 52MM  [employer]  Right 1 Implanted  LINER NEUTRAL 52X36MM PLUS 4 - [id number] Liner LINER NEUTRAL 52X36MM PLUS 4  [employer]  Right 1 Implanted  SCREW 6.5MMX25MM - [id number] Screw SCREW 6.5MMX25MM  [employer]  Right 1 Implanted  STEM FEMORAL SZ 6MM STD ACTIS - [id number] Stem STEM FEMORAL SZ 6MM STD ACTIS  [employer]  Right 1 Implanted  HEAD CERAMIC 36 PLUS5 - [id number] Hips HEAD CERAMIC 36 PLUS5  [employer]  Right 1 Implanted

## 2020-06-15 NOTE — H&P (Signed)
PREOPERATIVE H&P  Chief Complaint: right hip osteoarthritis  HPI: Lori Cook is a 69 y.o. female who presents for surgical treatment of right hip osteoarthritis.  She denies any changes in medical history.  Past Medical History:  Diagnosis Date  . Allergy   . Arthritis   . Blood transfusion without reported diagnosis   . Cholelithiasis    large stone detected by CT 01/2016  . GERD (gastroesophageal reflux disease)   . Hyperlipidemia   . Hypertension   . Nephrolithiasis    32mm kidney stone L by CT 01/2016  . 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 ABDOMINAL HYSTERECTOMY    . APPENDECTOMY    . CHOLECYSTECTOMY    . COLONOSCOPY     [facility]- records purges and destroyed- per pt Normal   . SPINE SURGERY    . 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   Procedure: LEFT TOTAL HIP ARTHROPLASTY ANTERIOR APPROACH;  Surgeon: Leandrew Koyanagi, MD;  Location: [place];  Service: Orthopedics;  Laterality: Left;   Social History   Socioeconomic History  . Marital status: Single    Spouse name: Not on file  . Number of children: Not on file  . Years of education: Not on file  . Highest education level: Not on file  Occupational History  . Not on file  Tobacco Use  . Smoking status: Former Smoker    Packs/day: 1.50    Years: 39.00    Pack years: 58.50    Start date: 19    Quit date: 09/08/2010    Years since quitting: 9.7  . Smokeless tobacco: Never Used  Vaping Use  . Vaping Use: Never used  Substance and Sexual Activity  . Alcohol use: No  . Drug use: No  . Sexual activity: Not on file  Other Topics Concern  . Not on file  Social History Narrative   Marital status: single; not dating; not interested in 2018     Children: 2 daughters, 1 son passed away 38.51 years old.  Two grandchildren (20,8)      Lives: with daughter, grandson.      Employment:  Education administrator business  full time; one employee      Tobacco: quit smoking      Alcohol:  None      Exercise: no formal exercise      ADLs: independent with ADLs; drives; no assistant devices.       Advanced Directives: none in 2018.  FULL CODE; no prolonged measures. HCPOA: daughters   Social Determinants of Health   Financial Resource Strain:   . Difficulty of Paying Living Expenses: Not on file  Food Insecurity:   . Worried About Charity fundraiser in the Last Year: Not on file  . Ran Out of Food in the Last Year: Not on file  Transportation Needs:   . Lack of Transportation (Medical): Not on file  . Lack of Transportation (Non-Medical): Not on file  Physical Activity:   . Days of Exercise per Week: Not on file  . Minutes of Exercise per Session: Not on file  Stress:   . Feeling of Stress : Not on file  Social Connections:   . Frequency of Communication with Friends and Family: Not on file  . Frequency of Social Gatherings with Friends and Family: Not on file  . Attends Religious  Services: Not on file  . Active Member of Clubs or Organizations: Not on file  . Attends Archivist Meetings: Not on file  . Marital Status: Not on file   Family History  Problem Relation Age of Onset  . Stroke Father   . Diabetes Father   . Heart disease Father 30       AMI  . Hypertension Sister   . Arthritis Brother   . Mental illness Mother   . Heart disease Paternal Uncle   . Colon cancer Neg Hx   . Colon polyps Neg Hx   . Esophageal cancer Neg Hx   . Rectal cancer Neg Hx   . Stomach cancer Neg Hx    Allergies  Allergen Reactions  . Tramadol Nausea And Vomiting  . Codeine Other (See Comments)    Headache  . Oxycodone Palpitations   Prior to Admission medications   Medication Sig Start Date End Date Taking? Authorizing Provider  acetaminophen (TYLENOL) 500 MG tablet Take 1,000 mg by mouth every 6 (six) hours as needed for moderate pain.    Yes [provider]  alendronate (FOSAMAX)  70 MG tablet Take 1 tablet (70 mg total) by mouth once a week. Take with a full glass of water on an empty stomach. 01/07/20  Yes Shamleffer, Melanie Crazier, MD  amLODipine (NORVASC) 5 MG tablet Take 1 tablet (5 mg total) by mouth daily. 10/23/19  Yes Stallings, Zoe A, MD  amoxicillin (AMOXIL) 500 MG capsule Take 4 caps one hour prior to dental work Patient taking differently: Take 2,000 mg by mouth See admin instructions. Take 2000 mg by mouth one hour prior to dental work 04/29/20  Yes Aundra Dubin, PA-C  carboxymethylcellul-glycerin (OPTIVE) 0.5-0.9 % ophthalmic solution Place 1-2 drops into both eyes 3 (three) times daily as needed for dry eyes (dry/irritated eyes.).   Yes [provider]  famotidine (PEPCID) 20 MG tablet Take 20 mg by mouth daily.    Yes [provider]  fluticasone (FLONASE) 50 MCG/ACT nasal spray Place 2 sprays into both nostrils daily. 04/17/20  Yes Yu, Amy V, PA-C  naproxen sodium (ALEVE) 220 MG tablet Take 440 mg by mouth 2 (two) times daily as needed (pain).    Yes [provider]  simvastatin (ZOCOR) 40 MG tablet Take 1 tablet by mouth once daily with breakfast Patient taking differently: Take 40 mg by mouth daily with breakfast.  10/23/19  Yes Delia Chimes A, MD  aspirin EC 81 MG tablet Take 1 tablet (81 mg total) by mouth in the morning and at bedtime. 06/09/20   Aundra Dubin, PA-C  azelastine (ASTELIN) 0.1 % nasal spray Place 2 sprays into both nostrils 2 (two) times daily. 04/17/20   Tasia Catchings, Amy V, PA-C  celecoxib (CELEBREX) 200 MG capsule Take 1 capsule (200 mg total) by mouth 2 (two) times daily. 06/09/20 06/09/21  Aundra Dubin, PA-C  docusate sodium (COLACE) 100 MG capsule Take 1 capsule (100 mg total) by mouth daily as needed. 06/09/20 06/09/21  Aundra Dubin, PA-C  methocarbamol (ROBAXIN-750) 750 MG tablet Take 1 tablet (750 mg total) by mouth 3 (three) times daily as needed for muscle spasms. 06/09/20   Aundra Dubin, PA-C    Nutritional Supplements (JUICE PLUS FIBRE PO) Take 2 tablets by mouth daily.     [provider]  ondansetron (ZOFRAN) 4 MG tablet Take 1 tablet (4 mg total) by mouth every 8 (eight) hours as needed for nausea  or vomiting. 06/09/20   Aundra Dubin, PA-C  oxyCODONE-acetaminophen (PERCOCET) 5-325 MG tablet Take 1-2 tablets by mouth every 6 (six) hours as needed. 06/09/20   Aundra Dubin, PA-C  oxyCODONE-acetaminophen (PERCOCET) 5-325 MG tablet Take 1 tablet by mouth every 6 (six) hours as needed for severe pain. 06/12/20   Leandrew Koyanagi, MD     Positive ROS: All other systems have been reviewed and were otherwise negative with the exception of those mentioned in the HPI and as above.  Physical Exam: General: Alert, no acute distress Cardiovascular: No pedal edema Respiratory: No cyanosis, no use of accessory musculature GI: abdomen soft Skin: No lesions in the area of chief complaint Neurologic: Sensation intact distally Psychiatric: Patient is competent for consent with normal mood and affect Lymphatic: no lymphedema  MUSCULOSKELETAL: exam stable  Assessment: right hip osteoarthritis  Plan: Plan for Procedure(s): RIGHT TOTAL HIP ARTHROPLASTY ANTERIOR APPROACH  The risks benefits and alternatives were discussed with the patient including but not limited to the risks of nonoperative treatment, versus surgical intervention including infection, bleeding, nerve injury,  blood clots, cardiopulmonary complications, morbidity, mortality, among others, and they were willing to proceed.   Preoperative templating of the joint replacement has been completed, documented, and submitted to the Operating Room personnel in order to optimize intra-operative equipment management.   Eduard Roux, MD 06/15/2020 7:08 AM

## 2020-06-15 NOTE — Anesthesia Preprocedure Evaluation (Addendum)
Anesthesia Evaluation  Patient identified by MRN, date of birth, ID band Patient awake    Reviewed: Allergy & Precautions, NPO status , Patient's Chart, lab work & pertinent test results  Airway Mallampati: II  TM Distance: >3 FB Neck ROM: Full    Dental no notable dental hx.    Pulmonary neg pulmonary ROS, former smoker,    Pulmonary exam normal breath sounds clear to auscultation       Cardiovascular hypertension, Normal cardiovascular exam Rhythm:Regular Rate:Normal  Sinus bradycardia Otherwise normal ECG Since last tracing rate slower Confirmed by Buford Dresser [phone]) on 06/13/2020 3:39:34 PM   Neuro/Psych CVA, Residual Symptoms negative psych ROS   GI/Hepatic GERD  ,  Endo/Other  negative endocrine ROS  Renal/GU Renal disease (kidney stones)  negative genitourinary   Musculoskeletal  (+) Arthritis ,   Abdominal Normal abdominal exam  (+)   Peds negative pediatric ROS (+)  Hematology negative hematology ROS (+)   Anesthesia Other Findings   Reproductive/Obstetrics negative OB ROS                           Anesthesia Physical Anesthesia Plan  ASA: III  Anesthesia Plan: Spinal   Post-op Pain Management:    Induction: Intravenous  PONV Risk Score and Plan: 2 and Propofol infusion, Ondansetron, Midazolam and Treatment may vary due to age or medical condition  Airway Management Planned: Natural Airway and Simple Face Mask  Additional Equipment:   Intra-op Plan:   Post-operative Plan:   Informed Consent: I have reviewed the patients History and Physical, chart, labs and discussed the procedure including the risks, benefits and alternatives for the proposed anesthesia with the patient or authorized representative who has indicated his/her understanding and acceptance.       Plan Discussed with: CRNA, Anesthesiologist and Surgeon  Anesthesia Plan Comments: (Spinal.  GETA as backup plan. )        Anesthesia Quick Evaluation

## 2020-06-15 NOTE — Anesthesia Procedure Notes (Addendum)
Procedure Name: MAC Date/Time: 06/15/2020 10:04 AM Performed by: Michele Rockers, CRNA Pre-anesthesia Checklist: Patient identified, Emergency Drugs available, Suction available, Timeout performed and Patient being monitored Patient Re-evaluated:Patient Re-evaluated prior to induction Oxygen Delivery Method: Simple face mask

## 2020-06-15 NOTE — Progress Notes (Signed)
Patient ordered oxi IR for pain. 10mg  removed from pyxis. Patient stated that plain Oxycodone gives her heart palpitations, but percocet does not. Dr. Erlinda Hong called and discontinued Oxycodone and ordered Vicodin. 10mg  Oxy wasted in stericycle with witness and MAR changed to not given.   Patient refused vicodin due to her thinking she may have had a reaction in the past as well. Mediciation returned to pyxis. MD notified to look at oral pain medication orders.

## 2020-06-15 NOTE — Anesthesia Procedure Notes (Signed)
Spinal  Patient location during procedure: OR Start time: 06/15/2020 10:10 AM End time: 06/15/2020 10:15 AM Staffing Performed: anesthesiologist  Anesthesiologist: Merlinda Frederick, MD Preanesthetic Checklist Completed: patient identified, IV checked, risks and benefits discussed, surgical consent, monitors and equipment checked, pre-op evaluation and timeout performed Spinal Block Patient position: sitting Prep: DuraPrep and site prepped and draped Patient monitoring: continuous pulse ox, blood pressure and heart rate Approach: midline Location: L3-4 Injection technique: single-shot Needle Needle type: Pencan  Needle gauge: 24 G Needle length: 9 cm Additional Notes Functioning IV was confirmed and monitors were applied. Sterile prep and drape, including hand hygiene and sterile gloves were used. The patient was positioned and the spine was prepped. The skin was anesthetized with lidocaine.  Free flow of clear CSF was obtained prior to injecting local anesthetic into the CSF. The needle was carefully withdrawn. The patient tolerated the procedure well.

## 2020-06-16 ENCOUNTER — Encounter (HOSPITAL_COMMUNITY): Payer: Self-pay | Admitting: Orthopaedic Surgery

## 2020-06-16 ENCOUNTER — Telehealth: Payer: Self-pay | Admitting: Orthopaedic Surgery

## 2020-06-16 DIAGNOSIS — M1611 Unilateral primary osteoarthritis, right hip: Secondary | ICD-10-CM | POA: Diagnosis not present

## 2020-06-16 LAB — BASIC METABOLIC PANEL
Anion gap: 11 (ref 5–15)
BUN: 21 mg/dL (ref 8–23)
CO2: 23 mmol/L (ref 22–32)
Calcium: 9.5 mg/dL (ref 8.9–10.3)
Chloride: 104 mmol/L (ref 98–111)
Creatinine, Ser: 0.95 mg/dL (ref 0.44–1.00)
GFR calc non Af Amer: >60 mL/min (ref >60)
Glucose, Bld: 187 mg/dL — ABNORMAL HIGH (ref 70–99)
Potassium: 4.2 mmol/L (ref 3.5–5.1)
Sodium: 138 mmol/L (ref 135–145)

## 2020-06-16 LAB — CBC
HCT: 34.1 % — ABNORMAL LOW (ref 36.0–46.0)
Hemoglobin: 11.1 g/dL — ABNORMAL LOW (ref 12.0–15.0)
MCH: 28.8 pg (ref 26.0–34.0)
MCHC: 32.6 g/dL (ref 30.0–36.0)
MCV: 88.3 fL (ref 80.0–100.0)
Platelets: 282 10*3/uL (ref 150–400)
RBC: 3.86 MIL/uL — ABNORMAL LOW (ref 3.87–5.11)
RDW: 14.2 % (ref 11.5–15.5)
WBC: 24.2 10*3/uL — ABNORMAL HIGH (ref 4.0–10.5)
nRBC: 0 % (ref 0.0–0.2)

## 2020-06-16 NOTE — Telephone Encounter (Signed)
Patient called with medical questions. Patient asked for the nurse to give her a call back. Patient phone number is [phone].

## 2020-06-16 NOTE — Addendum Note (Signed)
Addendum  created 06/16/20 1337 by Merlinda Frederick, MD   Intraprocedure Staff edited

## 2020-06-16 NOTE — Progress Notes (Signed)
Subjective: 1 Day Post-Op Procedure(s) (LRB): RIGHT TOTAL HIP ARTHROPLASTY ANTERIOR APPROACH (Right) Patient reports pain as mild.    Objective: Vital signs in last 24 hours: Temp:  [97.6 F (36.4 C)-98.1 F (36.7 C)] 97.8 F (36.6 C) (10/05 0737) Pulse Rate:  [48-72] 68 (10/05 0737) Resp:  [13-19] 18 (10/05 0737) BP: (102-153)/(55-86) 102/60 (10/05 0737) SpO2:  [97 %-100 %] 99 % (10/05 0737)  Intake/Output from previous day: 10/04 0701 - 10/05 0700 In: 1412.8 [I.V.:1000; IV Piggyback:412.8] Out: 800 [Urine:600; Blood:200] Intake/Output this shift: No intake/output data recorded.  Recent Labs    06/16/20 0731  HGB 11.1*   Recent Labs    06/16/20 0731  WBC 24.2*  RBC 3.86*  HCT 34.1*  PLT 282   Recent Labs    06/16/20 0731  NA 138  K 4.2  CL 104  CO2 23  BUN 21  CREATININE 0.95  GLUCOSE 187*  CALCIUM 9.5   No results for input(s): LABPT, INR in the last 72 hours.  Neurologically intact Neurovascular intact Sensation intact distally Intact pulses distally Dorsiflexion/Plantar flexion intact Incision: dressing C/D/I No cellulitis present Compartment soft   Assessment/Plan: 1 Day Post-Op Procedure(s) (LRB): RIGHT TOTAL HIP ARTHROPLASTY ANTERIOR APPROACH (Right) Advance diet Up with therapy D/C IV fluids Discharge home with home health  WBAT RLE ABLA- mild and stable      Lori Cook 06/16/2020, 8:05 AM

## 2020-06-16 NOTE — Plan of Care (Signed)
Pt doing well. Pt given D/C instructions with verbal understanding. Rx's were given to the Pt prior to surgery. Pt's incision is clean and dry with no sign of infection. Pt's IV was removed prior to D/C. Pt D/C'd home via wheelchair per MD order. Pt is stable @ D/C and has no other needs at this time. Holli Humbles, RN

## 2020-06-16 NOTE — Discharge Summary (Signed)
Patient ID: Lori Cook MRN: [id number] DOB/AGE: 68-Jun-1952 68 y.o.  Admit date: 06/15/2020 Discharge date: 06/16/2020  Admission Diagnoses:  Principal Problem:   Primary osteoarthritis of right hip Active Problems:   Status post total replacement of right hip   Discharge Diagnoses:  Same  Past Medical History:  Diagnosis Date  . Allergy   . Arthritis   . Blood transfusion without reported diagnosis   . Cholelithiasis    large stone detected by CT 01/2016  . GERD (gastroesophageal reflux disease)   . Hyperlipidemia   . Hypertension   . Nephrolithiasis    35mm kidney stone L by CT 01/2016  . 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 female who was admitted 06/15/2020 for operative treatment ofPrimary osteoarthritis of right hip. Patient has severe unremitting pain that affects sleep, daily activities, and work/hobbies. After pre-op clearance the patient was taken to the operating room on 06/15/2020 and underwent  Procedure(s): RIGHT TOTAL HIP ARTHROPLASTY ANTERIOR APPROACH.    Patient was given perioperative antibiotics:  Anti-infectives (From admission, onward)   Start     Dose/Rate Route Frequency Ordered Stop   06/15/20 1600  ceFAZolin (ANCEF) IVPB 2g/100 mL premix        2 g 200 mL/hr over 30 Minutes Intravenous Every 6 hours 06/15/20 1417 06/15/20 2155   06/15/20 1047  vancomycin (VANCOCIN) powder  Status:  Discontinued          As needed 06/15/20 1047 06/15/20 1150   06/15/20 0845  ceFAZolin (ANCEF) IVPB 2g/100 mL premix        2 g 200 mL/hr over 30 Minutes Intravenous On call to O.R. 06/15/20 0835 06/15/20 1012       Patient was given sequential compression devices, early ambulation, and chemoprophylaxis to prevent DVT.  Patient benefited  maximally from hospital stay and there were no complications.    Recent vital signs:  Patient Vitals for the past 24 hrs:  BP Temp Temp src Pulse Resp SpO2  06/16/20 0737 102/60 97.8 F (36.6 C) Oral 68 18 99 %  06/16/20 0309 (!) 114/55 98.1 F (36.7 C) Oral 67 18 97 %  06/15/20 2306 (!) 120/58 97.6 F (36.4 C) Oral 64 18 98 %  06/15/20 1918 118/68 97.8 F (36.6 C) Oral 72 18 100 %  06/15/20 1421 (!) 153/71 97.9 F (36.6 C) Oral 64 18 100 %  06/15/20 1323 137/76 -- -- (!) 58 15 100 %  06/15/20 1253 129/64 97.9 F (36.6 C) -- (!) 56 15 100 %  06/15/20 1241 132/70 -- -- (!) 48 13 99 %  06/15/20 1226 (!) 143/77 -- -- (!) 51 15 100 %  06/15/20 1210 136/86 -- -- (!) 58 17 100 %  06/15/20 1156 123/78 98.1 F (36.7 C) -- (!) 50 19 100 %     Recent laboratory studies:  Recent Labs    06/16/20 0731  WBC 24.2*  HGB 11.1*  HCT 34.1*  PLT 282  NA 138  K 4.2  CL 104  CO2 23  BUN 21  CREATININE 0.95  GLUCOSE 187*  CALCIUM 9.5     Discharge Medications:   Allergies as of 06/16/2020      Reactions   Tramadol Nausea And Vomiting   Codeine  Other (See Comments)   Headache   Oxycodone Palpitations      Medication List    TAKE these medications   acetaminophen 500 MG tablet Commonly known as: TYLENOL Take 1,000 mg by mouth every 6 (six) hours as needed for moderate pain.   alendronate 70 MG tablet Commonly known as: Fosamax Take 1 tablet (70 mg total) by mouth once a week. Take with a full glass of water on an empty stomach.   amLODipine 5 MG tablet Commonly known as: NORVASC Take 1 tablet (5 mg total) by mouth daily.   amoxicillin 500 MG capsule Commonly known as: AMOXIL Take 4 caps one hour prior to dental work What changed:   how much to take  how to take this  when to take this  additional instructions   aspirin EC 81 MG tablet Take 1 tablet (81 mg total) by mouth in the morning and at bedtime.   azelastine 0.1 % nasal spray Commonly known as:  ASTELIN Place 2 sprays into both nostrils 2 (two) times daily.   celecoxib 200 MG capsule Commonly known as: CeleBREX Take 1 capsule (200 mg total) by mouth 2 (two) times daily.   docusate sodium 100 MG capsule Commonly known as: Colace Take 1 capsule (100 mg total) by mouth daily as needed.   famotidine 20 MG tablet Commonly known as: PEPCID Take 20 mg by mouth daily.   fluticasone 50 MCG/ACT nasal spray Commonly known as: FLONASE Place 2 sprays into both nostrils daily.   JUICE PLUS FIBRE PO Take 2 tablets by mouth daily.   methocarbamol 750 MG tablet Commonly known as: Robaxin-750 Take 1 tablet (750 mg total) by mouth 3 (three) times daily as needed for muscle spasms.   naproxen sodium 220 MG tablet Commonly known as: ALEVE Take 440 mg by mouth 2 (two) times daily as needed (pain).   ondansetron 4 MG tablet Commonly known as: Zofran Take 1 tablet (4 mg total) by mouth every 8 (eight) hours as needed for nausea or vomiting.   OPTIVE 0.5-0.9 % ophthalmic solution Generic drug: carboxymethylcellul-glycerin Place 1-2 drops into both eyes 3 (three) times daily as needed for dry eyes (dry/irritated eyes.).   oxyCODONE-acetaminophen 5-325 MG tablet Commonly known as: Percocet Take 1-2 tablets by mouth every 6 (six) hours as needed.   oxyCODONE-acetaminophen 5-325 MG tablet Commonly known as: Percocet Take 1 tablet by mouth every 6 (six) hours as needed for severe pain.   simvastatin 40 MG tablet Commonly known as: ZOCOR Take 1 tablet by mouth once daily with breakfast What changed:   how much to take  how to take this  when to take this  additional instructions            Durable Medical Equipment  (From admission, onward)         Start     Ordered   06/15/20 1418  DME Walker rolling  Once       Question:  Patient needs a walker to treat with the following condition  Answer:  History of hip replacement   06/15/20 1417   06/15/20 1418  DME 3 n 1   Once        06/15/20 1417   06/15/20 1418  DME Bedside commode  Once       Question:  Patient needs a bedside commode to treat with the following condition  Answer:  History of hip replacement   06/15/20 1417  Diagnostic Studies: DG Chest 2 View  Result Date: 06/11/2020 CLINICAL DATA:  Preoperative evaluation for RIGHT total hip arthroplasty on 06/15/2020, history GERD, hypertension, stroke, former smoker EXAM: CHEST - 2 VIEW COMPARISON:  11/21/2019 FINDINGS: Normal heart size, mediastinal contours, and pulmonary vascularity. Lungs clear. No pulmonary infiltrate, pleural effusion, or pneumothorax. Biconvex thoracolumbar scoliosis. IMPRESSION: No acute abnormalities. Electronically Signed   By: Lavonia Dana M.D.   On: 06/11/2020 14:14   DG Pelvis Portable  Result Date: 06/15/2020 CLINICAL DATA:  69 year old female status post right hip arthroplasty today. EXAM: PORTABLE PELVIS 1-2 VIEWS COMPARISON:  Intraoperative images 10 22 hours today. Pelvis series 05/19/2020. FINDINGS: Portable AP supine view at 1230 hours. Pre-existing left total hip arthroplasty. New right total hip arthroplasty hardware appears intact with normal AP alignment. No unexpected osseous changes. Visible pelvis remains intact. Mild postoperative gas in the soft tissues around the right hip. IMPRESSION: New right total hip arthroplasty with no adverse features. Electronically Signed   By: Genevie Ann M.D.   On: 06/15/2020 13:10   DG C-Arm 1-60 Min  Result Date: 06/15/2020 CLINICAL DATA:  Intraoperative imaging for right hip replacement. EXAM: OPERATIVE RIGHT HIP (WITH PELVIS IF PERFORMED) 3 VIEWS TECHNIQUE: Fluoroscopic spot image(s) were submitted for interpretation post-operatively. COMPARISON:  None. FINDINGS: First image demonstrates advanced right hip osteoarthritis. On the second 2 images, a new total hip replacement is in place on the right. No acute abnormality is identified. IMPRESSION: Intraoperative imaging for  right hip replacement.  No acute finding. Electronically Signed   By: Inge Rise M.D.   On: 06/15/2020 11:57   DG HIP OPERATIVE UNILAT W OR W/O PELVIS RIGHT  Result Date: 06/15/2020 CLINICAL DATA:  Intraoperative imaging for right hip replacement. EXAM: OPERATIVE RIGHT HIP (WITH PELVIS IF PERFORMED) 3 VIEWS TECHNIQUE: Fluoroscopic spot image(s) were submitted for interpretation post-operatively. COMPARISON:  None. FINDINGS: First image demonstrates advanced right hip osteoarthritis. On the second 2 images, a new total hip replacement is in place on the right. No acute abnormality is identified. IMPRESSION: Intraoperative imaging for right hip replacement.  No acute finding. Electronically Signed   By: Inge Rise M.D.   On: 06/15/2020 11:57   XR Pelvis 1-2 Views  Result Date: 05/25/2020 Stable total hip replacement.  End stage right hip DJD   Disposition: Discharge disposition: 01-Home or Self Care       Discharge Instructions    Call MD / Call 911   Complete by: As directed    If you experience chest pain or shortness of breath, CALL 911 and be transported to the hospital emergency room.  If you develope a fever above 101.5 F, pus (white drainage) or increased drainage or redness at the wound, or calf pain, call your surgeon's office.   Constipation Prevention   Complete by: As directed    Drink plenty of fluids.  Prune juice may be helpful.  You may use a stool softener, such as Colace (over the counter) 100 mg twice a day.  Use MiraLax (over the counter) for constipation as needed.   Driving restrictions   Complete by: As directed    No driving while taking narcotic pain meds.   Increase activity slowly as tolerated   Complete by: As directed        Follow-up Information    Leandrew Koyanagi, MD In 2 weeks.   Specialty: Orthopedic Surgery Why: For suture removal, For wound re-check Contact information: [place] [id number] [phone]  Signed: Aundra Dubin 06/16/2020, 8:07 AM

## 2020-06-16 NOTE — Progress Notes (Signed)
Physical Therapy Treatment Patient Details Name: Lori Cook MRN: [id number] DOB: May 26, 1951 Today's Date: 06/16/2020    History of Present Illness Pt is a 69 y/o female s/p R THA, direct anterior. PMH includes HTN, CVA,  and L THA.     PT Comments    Patient progressing well towards PT goals. Reports sensation has returned to RLE and block has worn off. Tolerated bed mobility with Min A to manage RLE and supervision for transfers and gait training with use of RW for support. Performed stair training with Min guard assist for safety. Reviewed there ex and car transfer. Safe to d/c home from a mobility stand point. Will follow while in the hospital for higher level balance, gait training with lesser DME etc.   Follow Up Recommendations  Follow surgeon's recommendation for DC plan and follow-up therapies;Supervision - Intermittent     Equipment Recommendations  None recommended by PT    Recommendations for Other Services       Precautions / Restrictions Precautions Precautions: None Restrictions Weight Bearing Restrictions: Yes RLE Weight Bearing: Weight bearing as tolerated    Mobility  Bed Mobility Overal bed mobility: Needs Assistance Bed Mobility: Supine to Sit;Sit to Supine     Supine to sit: Supervision;HOB elevated Sit to supine: Min assist;HOB elevated   General bed mobility comments: Able to bring RLE to EOB with use of UEs; needs assist to bring RLE into bed to return to supine.  Transfers Overall transfer level: Needs assistance Equipment used: Rolling walker (2 wheeled) Transfers: Sit to/from Stand Sit to Stand: Supervision         General transfer comment: Supervision for safety. Stood from [employer].  Ambulation/Gait Ambulation/Gait assistance: Supervision Gait Distance (Feet): 400 Feet Assistive device: Rolling walker (2 wheeled) Gait Pattern/deviations: Step-through pattern;Decreased stride length;Trunk flexed;Antalgic Gait velocity: Decreased     General Gait Details: Slow, steady gait with cues for step through gait; initially antalgic which improved with distance.   Stairs Stairs: Yes Stairs assistance: Min guard Stair Management: Step to pattern;Two rails Number of Stairs: 3 General stair comments: Cues for technique and safety.   Wheelchair Mobility    Modified Rankin (Stroke Patients Only)       Balance Overall balance assessment: Needs assistance Sitting-balance support: No upper extremity supported;Feet supported Sitting balance-Leahy Scale: Good Sitting balance - Comments: supervision for safety   Standing balance support: During functional activity Standing balance-Leahy Scale: Fair Standing balance comment: Able to stand statically and do minimal dynamic movements without UE support; does need UE support for ambulation                            Cognition Arousal/Alertness: Awake/alert Behavior During Therapy: WFL for tasks assessed/performed Overall Cognitive Status: Within Functional Limits for tasks assessed                                        Exercises Total Joint Exercises Quad Sets: AROM;Both;5 reps;Supine Long Arc Quad: AROM;Right;5 reps;Seated    General Comments        Pertinent Vitals/Pain Pain Assessment: Faces Faces Pain Scale: Hurts a little bit Pain Location: Rt hip Pain Descriptors / Indicators: Operative site guarding Pain Intervention(s): Monitored during session;Repositioned    Home Living  Prior Function            PT Goals (current goals can now be found in the care plan section) Progress towards PT goals: Progressing toward goals    Frequency    7X/week      PT Plan Current plan remains appropriate    Co-evaluation              AM-PAC PT "6 Clicks" Mobility   Outcome Measure  Help needed turning from your back to your side while in a flat bed without using bedrails?: None Help needed  moving from lying on your back to sitting on the side of a flat bed without using bedrails?: None Help needed moving to and from a bed to a chair (including a wheelchair)?: None Help needed standing up from a chair using your arms (e.g., wheelchair or bedside chair)?: None Help needed to walk in hospital room?: A Little Help needed climbing 3-5 steps with a railing? : A Little 6 Click Score: 22    End of Session Equipment Utilized During Treatment: Gait belt Activity Tolerance: Patient tolerated treatment well Patient left: in bed;with call bell/phone within reach Nurse Communication: Mobility status PT Visit Diagnosis: Other abnormalities of gait and mobility (R26.89);Difficulty in walking, not elsewhere classified (R26.2);Pain Pain - Right/Left: Right Pain - part of body: Hip     Time: 0707-0727 PT Time Calculation (min) (ACUTE ONLY): 20 min  Charges:  $Gait Training: 8-22 mins                     Marisa Severin, PT, DPT Acute Rehabilitation Services Pager [phone] Office [place] 06/16/2020, 8:16 AM

## 2020-06-16 NOTE — Discharge Instructions (Signed)

## 2020-06-17 DIAGNOSIS — H348192 Central retinal vein occlusion, unspecified eye, stable: Secondary | ICD-10-CM | POA: Diagnosis not present

## 2020-06-17 DIAGNOSIS — Z471 Aftercare following joint replacement surgery: Secondary | ICD-10-CM | POA: Diagnosis not present

## 2020-06-17 DIAGNOSIS — Z87891 Personal history of nicotine dependence: Secondary | ICD-10-CM | POA: Diagnosis not present

## 2020-06-17 DIAGNOSIS — Z96643 Presence of artificial hip joint, bilateral: Secondary | ICD-10-CM | POA: Diagnosis not present

## 2020-06-17 DIAGNOSIS — Z7982 Long term (current) use of aspirin: Secondary | ICD-10-CM | POA: Diagnosis not present

## 2020-06-17 DIAGNOSIS — E785 Hyperlipidemia, unspecified: Secondary | ICD-10-CM | POA: Diagnosis not present

## 2020-06-17 DIAGNOSIS — I10 Essential (primary) hypertension: Secondary | ICD-10-CM | POA: Diagnosis not present

## 2020-06-17 DIAGNOSIS — Z9181 History of falling: Secondary | ICD-10-CM | POA: Diagnosis not present

## 2020-06-17 DIAGNOSIS — Z791 Long term (current) use of non-steroidal anti-inflammatories (NSAID): Secondary | ICD-10-CM | POA: Diagnosis not present

## 2020-06-17 DIAGNOSIS — Z79899 Other long term (current) drug therapy: Secondary | ICD-10-CM | POA: Diagnosis not present

## 2020-06-17 DIAGNOSIS — K219 Gastro-esophageal reflux disease without esophagitis: Secondary | ICD-10-CM | POA: Diagnosis not present

## 2020-06-17 DIAGNOSIS — Z9049 Acquired absence of other specified parts of digestive tract: Secondary | ICD-10-CM | POA: Diagnosis not present

## 2020-06-17 DIAGNOSIS — Z90710 Acquired absence of both cervix and uterus: Secondary | ICD-10-CM | POA: Diagnosis not present

## 2020-06-17 DIAGNOSIS — I69398 Other sequelae of cerebral infarction: Secondary | ICD-10-CM | POA: Diagnosis not present

## 2020-06-17 NOTE — Telephone Encounter (Signed)
Called patient back. She states HHPT came out and answered her questions. She states she is doing well. She will call us if she needs [place].

## 2020-06-19 LAB — TYPE AND SCREEN
ABO/RH(D): O POS
Antibody Screen: POSITIVE
Unit division: 0
Unit division: 0

## 2020-06-19 LAB — BPAM RBC
Blood Product Expiration Date: 202110292359
Blood Product Expiration Date: 202111032359
Unit Type and Rh: 9500
Unit Type and Rh: 9500

## 2020-06-26 ENCOUNTER — Telehealth: Payer: Self-pay | Admitting: Orthopaedic Surgery

## 2020-06-26 ENCOUNTER — Other Ambulatory Visit: Payer: Self-pay | Admitting: Physician Assistant

## 2020-06-26 NOTE — Telephone Encounter (Signed)
Please advise 

## 2020-06-26 NOTE — Telephone Encounter (Signed)
Patient called. She would like a refill on Robaxin called in. Her call back number is [phone]

## 2020-06-27 ENCOUNTER — Other Ambulatory Visit: Payer: Self-pay | Admitting: Physician Assistant

## 2020-06-27 MED ORDER — METHOCARBAMOL 750 MG PO TABS: 750.0000 mg | ORAL_TABLET | Freq: Three times a day (TID) | ORAL | 0 refills | Status: DC | PRN

## 2020-06-27 NOTE — Telephone Encounter (Signed)
Just sent in

## 2020-06-29 DIAGNOSIS — Z96643 Presence of artificial hip joint, bilateral: Secondary | ICD-10-CM | POA: Diagnosis not present

## 2020-06-29 DIAGNOSIS — Z90710 Acquired absence of both cervix and uterus: Secondary | ICD-10-CM | POA: Diagnosis not present

## 2020-06-29 DIAGNOSIS — Z87891 Personal history of nicotine dependence: Secondary | ICD-10-CM | POA: Diagnosis not present

## 2020-06-29 DIAGNOSIS — E785 Hyperlipidemia, unspecified: Secondary | ICD-10-CM | POA: Diagnosis not present

## 2020-06-29 DIAGNOSIS — I69398 Other sequelae of cerebral infarction: Secondary | ICD-10-CM | POA: Diagnosis not present

## 2020-06-29 DIAGNOSIS — Z79899 Other long term (current) drug therapy: Secondary | ICD-10-CM | POA: Diagnosis not present

## 2020-06-29 DIAGNOSIS — Z791 Long term (current) use of non-steroidal anti-inflammatories (NSAID): Secondary | ICD-10-CM | POA: Diagnosis not present

## 2020-06-29 DIAGNOSIS — K219 Gastro-esophageal reflux disease without esophagitis: Secondary | ICD-10-CM | POA: Diagnosis not present

## 2020-06-29 DIAGNOSIS — Z9049 Acquired absence of other specified parts of digestive tract: Secondary | ICD-10-CM | POA: Diagnosis not present

## 2020-06-29 DIAGNOSIS — I10 Essential (primary) hypertension: Secondary | ICD-10-CM | POA: Diagnosis not present

## 2020-06-29 DIAGNOSIS — H348192 Central retinal vein occlusion, unspecified eye, stable: Secondary | ICD-10-CM | POA: Diagnosis not present

## 2020-06-29 DIAGNOSIS — Z9181 History of falling: Secondary | ICD-10-CM | POA: Diagnosis not present

## 2020-06-29 DIAGNOSIS — Z471 Aftercare following joint replacement surgery: Secondary | ICD-10-CM | POA: Diagnosis not present

## 2020-06-29 DIAGNOSIS — Z7982 Long term (current) use of aspirin: Secondary | ICD-10-CM | POA: Diagnosis not present

## 2020-06-30 ENCOUNTER — Inpatient Hospital Stay: Payer: PPO | Admitting: Orthopaedic Surgery

## 2020-06-30 ENCOUNTER — Encounter: Payer: Self-pay | Admitting: Orthopaedic Surgery

## 2020-06-30 ENCOUNTER — Ambulatory Visit (INDEPENDENT_AMBULATORY_CARE_PROVIDER_SITE_OTHER): Payer: PPO | Admitting: Physician Assistant

## 2020-06-30 VITALS — Ht 66.0 in | Wt 154.0 lb

## 2020-06-30 DIAGNOSIS — Z96641 Presence of right artificial hip joint: Secondary | ICD-10-CM

## 2020-06-30 NOTE — Progress Notes (Signed)
Post-Op Visit Note   Patient: Lori Cook           Date of Birth: February 26, 1951           MRN: [id number] Visit Date: 06/30/2020 PCP: Wendie Agreste, MD   Assessment & Plan:  Chief Complaint:  Chief Complaint  Patient presents with  . Right Hip - Routine Post Op    Right total hip arthroplasty 06/15/2020   Visit Diagnoses:  1. Status post total hip replacement, right     Plan: Patient is a very pleasant 69 year old female who comes in today 2 weeks out right anterior total hip replacement.  She has been doing excellent.  She has finished home health PT but continues to work on a home exercise program.  She is ambulating with a cane.  She is taking aspirin for DVT prophylaxis.  She is no longer taking any narcotic pain medication.  Examination of her right hip reveals a well-healing surgical incision with nylon sutures in place.  No evidence of infection or cellulitis.  Calves are soft nontender.  She is neurovascular intact distally.  Today, sutures were removed and Steri-Strips applied.  She will continue working on her home exercise program.  She will follow up with [place] in 4 weeks time for repeat evaluation and AP pelvis x-rays.  Dental prophylaxis reinforced.  Call with concerns or questions.  Follow-Up Instructions: Return in about 4 weeks (around 07/28/2020).   Orders:  No orders of the defined types were placed in this encounter.  No orders of the defined types were placed in this encounter.   Imaging: No new imaging  PMFS History: Patient Active Problem List   Diagnosis Date Noted  . Status post total replacement of right hip 06/15/2020  . Primary osteoarthritis of right hip 06/14/2020  . Vitamin D insufficiency 01/09/2020  . Osteoporosis without current pathological fracture 01/07/2020  . Hypercalcemia 01/07/2020  . Status post total replacement of left hip 11/25/2019  . Calculus of gallbladder without cholecystitis without obstruction 05/07/2016  .  Nephrolithiasis 05/07/2016  . Pulmonary nodules 05/07/2016  . Hematuria, microscopic 05/07/2016  . Essential hypertension, benign 04/24/2015  . Allergic rhinitis due to pollen 04/24/2015  . Retinal vein occlusion, branch 04/24/2015  . Retinal vein occlusion 03/08/2015  . Essential hypertension-new onset 03/08/2015  . Hyperlipidemia with target LDL less than 130 09/09/2012   Past Medical History:  Diagnosis Date  . Allergy   . Arthritis   . Blood transfusion without reported diagnosis   . Cholelithiasis    large stone detected by CT 01/2016  . GERD (gastroesophageal reflux disease)   . Hyperlipidemia   . Hypertension   . Nephrolithiasis    79mm kidney stone L by CT 01/2016  . 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 Stroke [facility])    in retina only     Family History  Problem Relation Age of Onset  . Stroke Father   . Diabetes Father   . Heart disease Father 46       AMI  . Hypertension Sister   . Arthritis Brother   . Mental illness Mother   . Heart disease Paternal Uncle   . Colon cancer Neg Hx   . Colon polyps Neg Hx   . Esophageal cancer Neg Hx   . Rectal cancer Neg Hx   . Stomach cancer Neg Hx     Past Surgical History:  Procedure Laterality Date  . ABDOMINAL HYSTERECTOMY    . APPENDECTOMY    .  CHOLECYSTECTOMY    . COLONOSCOPY     [facility]- records purges and destroyed- per pt Normal   . SPINE SURGERY    . 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   Procedure: LEFT TOTAL HIP ARTHROPLASTY ANTERIOR APPROACH;  Surgeon: Leandrew Koyanagi, MD;  Location: [place];  Service: Orthopedics;  Laterality: Left;  . TOTAL HIP ARTHROPLASTY Right 06/15/2020   Procedure: RIGHT TOTAL HIP ARTHROPLASTY ANTERIOR APPROACH;  Surgeon: Leandrew Koyanagi, MD;  Location: [place];  Service: Orthopedics;  Laterality: Right;   Social History   Occupational History  . Not on file  Tobacco Use  . Smoking status: Former Smoker    Packs/day: 1.50    Years: 39.00    Pack years: 58.50    Start  date: 59    Quit date: 09/08/2010    Years since quitting: 9.8  . Smokeless tobacco: Never Used  Vaping Use  . Vaping Use: Never used  Substance and Sexual Activity  . Alcohol use: No  . Drug use: No  . Sexual activity: Not on file

## 2020-07-10 ENCOUNTER — Ambulatory Visit: Payer: PPO | Admitting: Internal Medicine

## 2020-07-13 ENCOUNTER — Other Ambulatory Visit: Payer: Self-pay | Admitting: Physician Assistant

## 2020-07-14 ENCOUNTER — Other Ambulatory Visit: Payer: Self-pay

## 2020-07-14 MED ORDER — ALENDRONATE SODIUM 70 MG PO TABS
70.0000 mg | ORAL_TABLET | ORAL | 6 refills | Status: DC
Start: 2020-07-14 — End: 2021-01-31

## 2020-07-21 ENCOUNTER — Telehealth: Payer: Self-pay | Admitting: Orthopaedic Surgery

## 2020-07-21 ENCOUNTER — Other Ambulatory Visit: Payer: Self-pay | Admitting: Physician Assistant

## 2020-07-21 MED ORDER — METHOCARBAMOL 750 MG PO TABS: 750.0000 mg | ORAL_TABLET | Freq: Three times a day (TID) | ORAL | 0 refills | Status: DC | PRN

## 2020-07-21 NOTE — Telephone Encounter (Signed)
Pt called asking we send her rx for robaxin into her pharmacy and pt would like to be updated when this is complete  [phone]

## 2020-07-21 NOTE — Telephone Encounter (Signed)
Sent in

## 2020-07-22 NOTE — Telephone Encounter (Signed)
Pt informed

## 2020-07-24 ENCOUNTER — Encounter: Payer: Self-pay | Admitting: Internal Medicine

## 2020-07-24 ENCOUNTER — Other Ambulatory Visit: Payer: Self-pay

## 2020-07-24 ENCOUNTER — Ambulatory Visit: Payer: PPO | Admitting: Internal Medicine

## 2020-07-24 DIAGNOSIS — M810 Age-related osteoporosis without current pathological fracture: Secondary | ICD-10-CM

## 2020-07-24 DIAGNOSIS — E559 Vitamin D deficiency, unspecified: Secondary | ICD-10-CM | POA: Diagnosis not present

## 2020-07-24 LAB — BASIC METABOLIC PANEL
BUN: 19 mg/dL (ref 6–23)
CO2: 27 mEq/L (ref 19–32)
Calcium: 9.8 mg/dL (ref 8.4–10.5)
Chloride: 104 mEq/L (ref 96–112)
Creatinine, Ser: 0.85 mg/dL (ref 0.40–1.20)
GFR: 70.49 mL/min (ref >60.00)
Glucose, Bld: 93 mg/dL (ref 70–99)
Potassium: 4 mEq/L (ref 3.5–5.1)
Sodium: 140 mEq/L (ref 135–145)

## 2020-07-24 LAB — VITAMIN D 25 HYDROXY (VIT D DEFICIENCY, FRACTURES): VITD: 19.4 ng/mL — ABNORMAL LOW (ref 30.00–100.00)

## 2020-07-24 LAB — ALBUMIN: Albumin: 4.3 g/dL (ref 3.5–5.2)

## 2020-07-24 NOTE — Patient Instructions (Addendum)

## 2020-07-24 NOTE — Progress Notes (Signed)
Name: Lori Cook  MRN/ DOB: [id number], 1951/02/10    Age/ Sex: 69 y.o., female     PCP: Wendie Agreste, MD   Reason for Endocrinology Evaluation: Hypercalcemia      Initial Endocrinology Clinic Visit: 01/08/2020    PATIENT IDENTIFIER: [name] is a 69 y.o., female with a past medical history of Osteopenia and dyslipidemia. She has followed with [facility] since 01/08/2020 for consultative assistance with management of her hypercalcemia.   HISTORICAL SUMMARY: The patient was first diagnosed with intermittent hypercalcemia since 2016  with serum calcium os 10.5 mg/dL ( Corrected calcium 9.78) and again in 2019 , serum calcium 10.4 ( corrected 9.84 mg/dL ) and in 10/2019 at 10.8 ( Corrected 10.32).  Her PTH has been fluctuating as high as 75 pg/mL .  She has a hx of renal stones  She has low bone density on DXA ( 10/24/2018)  As well as slip and fall wrist fracture  She was started on Alendronate in 12/2019 due to clinical osteoporosis   HCTZ was stopped in 10/2019  SUBJECTIVE:     Today (07/24/2020):  Lori Cook is here for hypercalcemia and osteoporosis Takes PPI to control cough due to GERD  Denies polyuria or polydipsia  No renal stones   S/P left hip replacement in 06/2020  HOME ENDOCRINE MEDICATIONS: Alendronate 70 mg weekly  Vitamin D 1000 iu daily       HISTORY:  Past Medical History:  Past Medical History:  Diagnosis Date  . Allergy   . Arthritis   . Blood transfusion without reported diagnosis   . Cholelithiasis    large stone detected by CT 01/2016  . GERD (gastroesophageal reflux disease)   . Hyperlipidemia   . Hypertension   . Nephrolithiasis    12mm kidney stone L by CT 01/2016  . 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 Stroke [facility])    in retina only    Past Surgical History:  Past Surgical History:  Procedure Laterality Date  . ABDOMINAL HYSTERECTOMY    . APPENDECTOMY    . CHOLECYSTECTOMY    . COLONOSCOPY      [facility]- records purges and destroyed- per pt Normal   . SPINE SURGERY    . 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   Procedure: LEFT TOTAL HIP ARTHROPLASTY ANTERIOR APPROACH;  Surgeon: Leandrew Koyanagi, MD;  Location: [place];  Service: Orthopedics;  Laterality: Left;  . TOTAL HIP ARTHROPLASTY Right 06/15/2020   Procedure: RIGHT TOTAL HIP ARTHROPLASTY ANTERIOR APPROACH;  Surgeon: Leandrew Koyanagi, MD;  Location: [place];  Service: Orthopedics;  Laterality: Right;    Social History:  reports that she quit smoking about 9 years ago. She started smoking about 49 years ago. She has a 58.50 pack-year smoking history. She has never used smokeless tobacco. She reports that she does not drink alcohol and does not use drugs. Family History:  Family History  Problem Relation Age of Onset  . Stroke Father   . Diabetes Father   . Heart disease Father 66       AMI  . Hypertension Sister   . Arthritis Brother   . Mental illness Mother   . Heart disease Paternal Uncle   . Colon cancer Neg Hx   . Colon polyps Neg Hx   . Esophageal cancer Neg Hx   . Rectal cancer Neg Hx   . Stomach cancer Neg Hx      HOME MEDICATIONS:  Allergies as of 07/24/2020      Reactions   Tramadol Nausea And Vomiting   Codeine Other (See Comments)   Headache   Oxycodone Palpitations      Medication List       Accurate as of July 24, 2020  9:24 AM. If you have any questions, ask your nurse or doctor.        STOP taking these medications   celecoxib 200 MG capsule Commonly known as: CeleBREX Stopped by: Dorita Sciara, MD   docusate sodium 100 MG capsule Commonly known as: Colace Stopped by: Dorita Sciara, MD   ondansetron 4 MG tablet Commonly known as: Zofran Stopped by: Dorita Sciara, MD   oxyCODONE-acetaminophen 5-325 MG tablet Commonly known as: Percocet Stopped by: Dorita Sciara, MD     TAKE these medications   acetaminophen 500 MG tablet Commonly known as:  TYLENOL Take 1,000 mg by mouth every 6 (six) hours as needed for moderate pain.   alendronate 70 MG tablet Commonly known as: Fosamax Take 1 tablet (70 mg total) by mouth once a week. Take with a full glass of water on an empty stomach.   amLODipine 5 MG tablet Commonly known as: NORVASC Take 1 tablet (5 mg total) by mouth daily.   amoxicillin 500 MG capsule Commonly known as: AMOXIL Take 4 caps one hour prior to dental work   aspirin EC 81 MG tablet Take 1 tablet (81 mg total) by mouth in the morning and at bedtime.   azelastine 0.1 % nasal spray Commonly known as: ASTELIN Place 2 sprays into both nostrils 2 (two) times daily.   famotidine 20 MG tablet Commonly known as: PEPCID Take 20 mg by mouth daily.   fluticasone 50 MCG/ACT nasal spray Commonly known as: FLONASE Place 2 sprays into both nostrils daily.   JUICE PLUS FIBRE PO Take 2 tablets by mouth daily.   methocarbamol 750 MG tablet Commonly known as: Robaxin-750 Take 1 tablet (750 mg total) by mouth 3 (three) times daily as needed for muscle spasms.   naproxen sodium 220 MG tablet Commonly known as: ALEVE Take 440 mg by mouth 2 (two) times daily as needed (pain).   OPTIVE 0.5-0.9 % ophthalmic solution Generic drug: carboxymethylcellul-glycerin Place 1-2 drops into both eyes 3 (three) times daily as needed for dry eyes (dry/irritated eyes.).   simvastatin 40 MG tablet Commonly known as: ZOCOR Take 1 tablet by mouth once daily with breakfast What changed:   how much to take  how to take this  when to take this  additional instructions         OBJECTIVE:   PHYSICAL EXAM: VS: BP 120/82   Pulse 82   Ht 5\' 6"  (1.676 m)   Wt 162 lb (73.5 kg)   SpO2 98%   BMI 26.15 kg/m    EXAM: General: Pt appears well and is in NAD  Neck: General: Supple without adenopathy. Thyroid: Thyroid size normal.  No goiter or nodules appreciated. No thyroid bruit.  Lungs: Clear with good BS bilat with no rales,  rhonchi, or wheezes  Heart: Auscultation: RRR.  Abdomen: Normoactive bowel sounds, soft, nontender, without masses or organomegaly palpable  Extremities:  BL LE: No pretibial edema normal ROM and strength.  Mental Status: Judgment, insight: Intact Orientation: Oriented to time, place, and person Memory: Intact for recent and remote events Mood and affect: No depression, anxiety, or agitation     DATA REVIEWED:  Results for Armbrister, Pami KIMREY (  MRN [id number]) as of 07/24/2020 13:05  Ref. Range 07/24/2020 09:39  Sodium Latest Ref Range: 135 - 145 mEq/L 140  Potassium Latest Ref Range: 3.5 - 5.1 mEq/L 4.0  Chloride Latest Ref Range: 96 - 112 mEq/L 104  CO2 Latest Ref Range: 19 - 32 mEq/L 27  Glucose Latest Ref Range: 70 - 99 mg/dL 93  BUN Latest Ref Range: 6 - 23 mg/dL 19  Creatinine Latest Ref Range: 0.40 - 1.20 mg/dL 0.85  Calcium Latest Ref Range: 8.4 - 10.5 mg/dL 9.8  Albumin Latest Ref Range: 3.5 - 5.2 g/dL 4.3  GFR Latest Ref Range: >60.00 mL/min 70.49  VITD Latest Ref Range: 30.00 - 100.00 ng/mL 19.40 (L)     ASSESSMENT / PLAN / RECOMMENDATIONS:   1. Hypercalcemia   - This was secondary to HCTZ - Repeat Calcium is normal at 9.8 mg/dL ( corrected 9.56)  , PTH pending  - Will proceed with 24-hr urine collection for Ca/Cr     2. Clinical osteoporosis:  We discussed the high FRAX rate  Of > 3% at the hips as well as a hx of fragility fracture in the past - She is tolerating it well  - Will repeat DXA on next visit    Medication :  Continue Alendronate 70 mg weekly    3. Vitamin D Deficiency :  - Pt  to increase double up on the dose as below    Medication  Vitamin D3 2000 iu daily    F/U in 1 yr     Signed electronically by: Mack Guise, MD  [facility] End[facility]  [facility] [address] [place], [place] [address] Phone: [phone] FAX: [phone]      CC: Wendie Agreste, [place] [address] Phone: [phone]  Fax: [phone]   Return to Endocrinology clinic as below: Future Appointments  Date Time Provider [place]  07/28/2020  8:45 AM Leandrew Koyanagi, MD [facility] None

## 2020-07-27 LAB — PARATHYROID HORMONE, INTACT (NO CA): PTH: 56 pg/mL (ref 14–64)

## 2020-07-28 ENCOUNTER — Ambulatory Visit: Payer: PPO | Admitting: Orthopaedic Surgery

## 2020-07-28 ENCOUNTER — Encounter: Payer: Self-pay | Admitting: Orthopaedic Surgery

## 2020-07-28 ENCOUNTER — Ambulatory Visit (INDEPENDENT_AMBULATORY_CARE_PROVIDER_SITE_OTHER): Payer: PPO

## 2020-07-28 ENCOUNTER — Ambulatory Visit (INDEPENDENT_AMBULATORY_CARE_PROVIDER_SITE_OTHER): Payer: PPO | Admitting: Orthopaedic Surgery

## 2020-07-28 DIAGNOSIS — Z96642 Presence of left artificial hip joint: Secondary | ICD-10-CM

## 2020-07-28 DIAGNOSIS — Z96641 Presence of right artificial hip joint: Secondary | ICD-10-CM

## 2020-07-28 NOTE — Progress Notes (Signed)
Post-Op Visit Note   Patient: Lori Cook           Date of Birth: 11-25-1950           MRN: [id number] Visit Date: 07/28/2020 PCP: Lori Agreste, MD   Assessment & Plan:  Chief Complaint:  Chief Complaint  Patient presents with  . Left Hip - Pain, Follow-up  . Right Hip - Pain   Visit Diagnoses:  1. Status post total replacement of right hip     Plan: [name] is 6 weeks status post right total hip replacement.  She is doing well overall.  Has resumed normal activities.  Takes occasional Robaxin and Tylenol.  Overall she feels that this has been a quicker rehab than the left hip replacement.  She has no complaints.  Surgical scar is fully healed.  Normal ambulation.  Activities discussed with the patient.  Dental prophylaxis reinforced.  Recheck in 6 weeks.  She may resume her normal aspirin dose.  Follow-Up Instructions: Return in about 6 weeks (around 09/08/2020).   Orders:  Orders Placed This Encounter  Procedures  . XR Pelvis 1-2 Views   No orders of the defined types were placed in this encounter.   Imaging: XR Pelvis 1-2 Views  Result Date: 07/28/2020 Stable total hip replacement without complications   PMFS History: Patient Active Problem List   Diagnosis Date Noted  . Status post total replacement of right hip 06/15/2020  . Primary osteoarthritis of right hip 06/14/2020  . Vitamin D insufficiency 01/09/2020  . Osteoporosis without current pathological fracture 01/07/2020  . Hypercalcemia 01/07/2020  . Status post total replacement of left hip 11/25/2019  . Calculus of gallbladder without cholecystitis without obstruction 05/07/2016  . Nephrolithiasis 05/07/2016  . Pulmonary nodules 05/07/2016  . Hematuria, microscopic 05/07/2016  . Essential hypertension, benign 04/24/2015  . Allergic rhinitis due to pollen 04/24/2015  . Retinal vein occlusion, branch 04/24/2015  . Retinal vein occlusion 03/08/2015  . Essential hypertension-new onset  03/08/2015  . Hyperlipidemia with target LDL less than 130 09/09/2012   Past Medical History:  Diagnosis Date  . Allergy   . Arthritis   . Blood transfusion without reported diagnosis   . Cholelithiasis    large stone detected by CT 01/2016  . GERD (gastroesophageal reflux disease)   . Hyperlipidemia   . Hypertension   . Nephrolithiasis    82mm kidney stone L by CT 01/2016  . 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 Stroke [facility])    in retina only     Family History  Problem Relation Age of Onset  . Stroke Father   . Diabetes Father   . Heart disease Father 55       AMI  . Hypertension Sister   . Arthritis Brother   . Mental illness Mother   . Heart disease Paternal Uncle   . Colon cancer Neg Hx   . Colon polyps Neg Hx   . Esophageal cancer Neg Hx   . Rectal cancer Neg Hx   . Stomach cancer Neg Hx     Past Surgical History:  Procedure Laterality Date  . ABDOMINAL HYSTERECTOMY    . APPENDECTOMY    . CHOLECYSTECTOMY    . COLONOSCOPY     [facility]- records purges and destroyed- per pt Normal   . SPINE SURGERY    . 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   Procedure: LEFT TOTAL HIP ARTHROPLASTY ANTERIOR APPROACH;  Surgeon: Leandrew Koyanagi, MD;  Location: [place];  Service: Orthopedics;  Laterality: Left;  . TOTAL HIP ARTHROPLASTY Right 06/15/2020   Procedure: RIGHT TOTAL HIP ARTHROPLASTY ANTERIOR APPROACH;  Surgeon: Leandrew Koyanagi, MD;  Location: [place];  Service: Orthopedics;  Laterality: Right;   Social History   Occupational History  . Not on file  Tobacco Use  . Smoking status: Former Smoker    Packs/day: 1.50    Years: 39.00    Pack years: 58.50    Start date: 47    Quit date: 09/08/2010    Years since quitting: 9.8  . Smokeless tobacco: Never Used  Vaping Use  . Vaping Use: Never used  Substance and Sexual Activity  . Alcohol use: No  . Drug use: No  . Sexual activity: Not on file

## 2020-08-04 ENCOUNTER — Encounter: Payer: Self-pay | Admitting: Gastroenterology

## 2020-08-17 ENCOUNTER — Other Ambulatory Visit: Payer: PPO

## 2020-08-17 ENCOUNTER — Other Ambulatory Visit: Payer: Self-pay

## 2020-08-18 ENCOUNTER — Other Ambulatory Visit: Payer: Self-pay

## 2020-08-18 ENCOUNTER — Telehealth: Payer: Self-pay | Admitting: *Deleted

## 2020-08-18 LAB — CALCIUM, URINE, 24 HOUR: Calcium, 24H Urine: 224 mg/24 h

## 2020-08-18 LAB — EXTRA URINE SPECIMEN

## 2020-08-18 LAB — CREATININE, URINE, 24 HOUR: Creatinine, 24H Ur: 1.02 g/(24.h) (ref 0.50–2.15)

## 2020-08-18 NOTE — Telephone Encounter (Signed)
Just called patient to advise of extra urine.

## 2020-08-18 NOTE — Telephone Encounter (Signed)
Noted  

## 2020-08-18 NOTE — Telephone Encounter (Signed)
Patient called stating she dropped off her 24hr urine yesterday and she received a message on mychart stating the lab didn't know what to do with it due to no test being ordered. Please advise [phone]

## 2020-08-18 NOTE — Telephone Encounter (Signed)
Lori Cook,   Could you help patient? It shows the 24 hour was completed but they had extra urine.

## 2020-09-08 ENCOUNTER — Ambulatory Visit: Payer: PPO | Admitting: Orthopaedic Surgery

## 2020-09-22 ENCOUNTER — Ambulatory Visit (INDEPENDENT_AMBULATORY_CARE_PROVIDER_SITE_OTHER): Payer: PPO | Admitting: Orthopaedic Surgery

## 2020-09-22 ENCOUNTER — Encounter: Payer: Self-pay | Admitting: Orthopaedic Surgery

## 2020-09-22 DIAGNOSIS — Z96641 Presence of right artificial hip joint: Secondary | ICD-10-CM

## 2020-09-22 NOTE — Progress Notes (Signed)
Post-Op Visit Note   Patient: Lori Cook           Date of Birth: 1951/01/28           MRN: [id number] Visit Date: 09/22/2020 PCP: Wendie Agreste, MD   Assessment & Plan:  Chief Complaint:  Chief Complaint  Patient presents with  . Right Hip - Pain   Visit Diagnoses:  1. Status post total replacement of right hip     Plan:   Gita is 3 months status post right total hip replacement. Doing well with no pain. She has no complaints. Not taking any medications for pain.  Surgical scar is fully healed. Her gait has normalized.  Dental prophylaxis reinforced today. Alysah is doing well for 3 months. She will steadily increase activity as tolerated. Recheck in 9 months with standing AP pelvis and lateral hip hip x-rays of both the right and left hip.  Follow-Up Instructions: Return in about 9 months (around 06/22/2021).   Orders:  No orders of the defined types were placed in this encounter.  No orders of the defined types were placed in this encounter.   Imaging: No results found.  PMFS History: Patient Active Problem List   Diagnosis Date Noted  . Status post total replacement of right hip 06/15/2020  . Primary osteoarthritis of right hip 06/14/2020  . Vitamin D insufficiency 01/09/2020  . Osteoporosis without current pathological fracture 01/07/2020  . Hypercalcemia 01/07/2020  . Status post total replacement of left hip 11/25/2019  . Calculus of gallbladder without cholecystitis without obstruction 05/07/2016  . Nephrolithiasis 05/07/2016  . Pulmonary nodules 05/07/2016  . Hematuria, microscopic 05/07/2016  . Essential hypertension, benign 04/24/2015  . Allergic rhinitis due to pollen 04/24/2015  . Retinal vein occlusion, branch 04/24/2015  . Retinal vein occlusion 03/08/2015  . Essential hypertension-new onset 03/08/2015  . Hyperlipidemia with target LDL less than 130 09/09/2012   Past Medical History:  Diagnosis Date  . Allergy   . Arthritis    . Blood transfusion without reported diagnosis   . Cholelithiasis    large stone detected by CT 01/2016  . GERD (gastroesophageal reflux disease)   . Hyperlipidemia   . Hypertension   . Nephrolithiasis    38mm kidney stone L by CT 01/2016  . 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 Stroke Father   . Diabetes Father   . Heart disease Father 65       AMI  . Hypertension Sister   . Arthritis Brother   . Mental illness Mother   . Heart disease Paternal Uncle   . Colon cancer Neg Hx   . Colon polyps Neg Hx   . Esophageal cancer Neg Hx   . Rectal cancer Neg Hx   . Stomach cancer Neg Hx     Past Surgical History:  Procedure Laterality Date  . ABDOMINAL HYSTERECTOMY    . APPENDECTOMY    . CHOLECYSTECTOMY    . COLONOSCOPY     [facility]- records purges and destroyed- per pt Normal   . SPINE SURGERY    . 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   Procedure: LEFT TOTAL HIP ARTHROPLASTY ANTERIOR APPROACH;  Surgeon: Leandrew Koyanagi, MD;  Location: [place];  Service: Orthopedics;  Laterality: Left;  . TOTAL HIP ARTHROPLASTY Right 06/15/2020   Procedure: RIGHT TOTAL HIP ARTHROPLASTY ANTERIOR APPROACH;  Surgeon: Leandrew Koyanagi, MD;  Location: [place];  Service: Orthopedics;  Laterality: Right;   Social History   Occupational History  . Not on file  Tobacco Use  . Smoking status: Former Smoker    Packs/day: 1.50    Years: 39.00    Pack years: 58.50    Start date: 70    Quit date: 09/08/2010    Years since quitting: 10.0  . Smokeless tobacco: Never Used  Vaping Use  . Vaping Use: Never used  Substance and Sexual Activity  . Alcohol use: No  . Drug use: No  . Sexual activity: Not on file

## 2020-10-06 ENCOUNTER — Ambulatory Visit (AMBULATORY_SURGERY_CENTER): Payer: Self-pay | Admitting: *Deleted

## 2020-10-06 ENCOUNTER — Other Ambulatory Visit: Payer: Self-pay

## 2020-10-06 VITALS — Ht 66.0 in | Wt 164.0 lb

## 2020-10-06 DIAGNOSIS — Z86010 Personal history of colonic polyps: Secondary | ICD-10-CM

## 2020-10-06 NOTE — Progress Notes (Signed)

## 2020-10-15 ENCOUNTER — Telehealth: Payer: Self-pay | Admitting: Orthopaedic Surgery

## 2020-10-15 NOTE — Telephone Encounter (Signed)
Yes, can you send in?

## 2020-10-15 NOTE — Telephone Encounter (Signed)
Pt wanted to know if she has to take the same antibotic you to take before a dental procedure before her colonoscopy

## 2020-10-16 NOTE — Telephone Encounter (Signed)
Patient aware she should take abx for 2 years after SU per Dr. Phoebe Sharps protocol. She already has Rx.

## 2020-10-26 ENCOUNTER — Encounter: Payer: Self-pay | Admitting: Family Medicine

## 2020-10-26 ENCOUNTER — Ambulatory Visit (INDEPENDENT_AMBULATORY_CARE_PROVIDER_SITE_OTHER): Payer: PPO | Admitting: Family Medicine

## 2020-10-26 ENCOUNTER — Other Ambulatory Visit: Payer: Self-pay

## 2020-10-26 VITALS — BP 152/80 | HR 78 | Temp 98.3°F | Ht 66.0 in | Wt 164.4 lb

## 2020-10-26 DIAGNOSIS — H6121 Impacted cerumen, right ear: Secondary | ICD-10-CM | POA: Diagnosis not present

## 2020-10-26 MED ORDER — NEOMYCIN-POLYMYXIN-HC 1 % OT SOLN: 3.0000 [drp] | Freq: Four times a day (QID) | OTIC | 0 refills | Status: AC

## 2020-10-26 NOTE — Patient Instructions (Addendum)
Eustachian Tube Dysfunction  Eustachian tube dysfunction refers to a condition in which a blockage develops in the narrow passage that connects the middle ear to the back of the nose (eustachian tube). The eustachian tube regulates air pressure in the middle ear by letting air move between the ear and nose. It also helps to drain fluid from the middle ear space. Eustachian tube dysfunction can affect one or both ears. When the eustachian tube does not function properly, air pressure, fluid, or both can build up in the middle ear. What are the causes? This condition occurs when the eustachian tube becomes blocked or cannot open normally. Common causes of this condition include:  Ear infections.  Colds and other infections that affect the nose, mouth, and throat (upper respiratory tract).  Allergies.  Irritation from cigarette smoke.  Irritation from stomach acid coming up into the esophagus (gastroesophageal reflux). The esophagus is the tube that carries food from the mouth to the stomach.  Sudden changes in air pressure, such as from descending in an airplane or scuba diving.  Abnormal growths in the nose or throat, such as: ? Growths that line the nose (nasal polyps). ? Abnormal growth of cells (tumors). ? Enlarged tissue at the back of the throat (adenoids). What increases the risk? You are more likely to develop this condition if:  You smoke.  You are overweight.  You are a child who has: ? Certain birth defects of the mouth, such as cleft palate. ? Large tonsils or adenoids. What are the signs or symptoms? Common symptoms of this condition include:  A feeling of fullness in the ear.  Ear pain.  Clicking or popping noises in the ear.  Ringing in the ear.  Hearing loss.  Loss of balance.  Dizziness. Symptoms may get worse when the air pressure around you changes, such as when you travel to an area of high elevation, fly on an airplane, or go scuba diving. How is  this diagnosed? This condition may be diagnosed based on:  Your symptoms.  A physical exam of your ears, nose, and throat.  Tests, such as those that measure: ? The movement of your eardrum (tympanogram). ? Your hearing (audiometry). How is this treated? Treatment depends on the cause and severity of your condition.  In mild cases, you may relieve your symptoms by moving air into your ears. This is called "popping the ears."  In more severe cases, or if you have symptoms of fluid in your ears, treatment may include: ? Medicines to relieve congestion (decongestants). ? Medicines that treat allergies (antihistamines). ? Nasal sprays or ear drops that contain medicines that reduce swelling (steroids). ? A procedure to drain the fluid in your eardrum (myringotomy). In this procedure, a small tube is placed in the eardrum to:  Drain the fluid.  Restore the air in the middle ear space. ? A procedure to insert a balloon device through the nose to inflate the opening of the eustachian tube (balloon dilation). Follow these instructions at home: Lifestyle  Do not do any of the following until your health care provider approves: ? Travel to high altitudes. ? Fly in airplanes. ? Work in a pressurized cabin or room. ? Scuba dive.  Do not use any products that contain nicotine or tobacco, such as cigarettes and e-cigarettes. If you need help quitting, ask your health care provider.  Keep your ears dry. Wear fitted earplugs during showering and bathing. Dry your ears completely after. General instructions  Take over-the-counter   over-the-counter and prescription medicines only as told by your health care provider.  Use techniques to help pop your ears as recommended by your health care provider. These may include: ? Chewing gum. ? Yawning. ? Frequent, forceful swallowing. ? Closing your mouth, holding your nose closed, and gently blowing as if you are trying to blow air out of your nose.  Keep all  follow-up visits as told by your health care provider. This is important. Contact a health care provider if:  Your symptoms do not go away after treatment.  Your symptoms come back after treatment.  You are unable to pop your ears.  You have: ? A fever. ? Pain in your ear. ? Pain in your head or neck. ? Fluid draining from your ear.  Your hearing suddenly changes.  You become very dizzy.  You lose your balance. Summary  Eustachian tube dysfunction refers to a condition in which a blockage develops in the eustachian tube.  It can be caused by ear infections, allergies, inhaled irritants, or abnormal growths in the nose or throat.  Symptoms include ear pain, hearing loss, or ringing in the ears.  Mild cases are treated with maneuvers to unblock the ears, such as yawning or ear popping.  Severe cases are treated with medicines. Surgery may also be done (rare). This information is not intended to replace advice given to you by your health care provider. Make sure you discuss any questions you have with your health care provider. Document Revised: 12/19/2017 Document Reviewed: 12/19/2017 Elsevier Patient Education  2021 [employer].   If you have lab work done today you will be contacted with your lab results within the next 2 weeks.  If you have not heard from [place] then please contact us. The fastest way to get your results is to register for My Chart.   IF you received an x-ray today, you will receive an invoice from [facility]. Please contact [facility] at [phone] with questions or concerns regarding your invoice.   IF you received labwork today, you will receive an invoice from [place]. Please contact [facility] at [phone] with questions or concerns regarding your invoice.   Our billing staff will not be able to assist you with questions regarding bills from these companies.  You will be contacted with the lab results as soon as they are  available. The fastest way to get your results is to activate your My Chart account. Instructions are located on the last page of this paperwork. If you have not heard from [place] regarding the results in 2 weeks, please contact this office.

## 2020-10-26 NOTE — Progress Notes (Signed)
2/14/20224:05 PM  Tajia Szeliga Callins 05/02/1951, 70 y.o., female [id number]  Chief Complaint  Patient presents with  . right ear pain     Possible infections. And Naythen Heikkila finished the amoxicillin  Off and on since July     HPI:   Patient is a 70 y.o. female with past medical history significant for HTN, allergies, HLD who presents today for Right ear pain.  Right ear pain started in July Was given drops via a virtual appt Went to Urgent care and was given nasal spray Went to ENT 06/04/20: small nasal passages on right Had ears washed out at that appointment and was feeling better Was given a week of amoxicillin in the past month by MD on call (week or 2 ago) Started having pain today again Took tylenol for pain Now still having general aching of ear and above high Has been using flonase since sept   HTN Amlodipine 5 mg  BP Readings from Last 3 Encounters:  10/26/20 (!) 152/76  07/24/20 120/82  06/16/20 102/60     Depression screen PHQ 2/9 10/26/2020 12/23/2019 11/18/2019  Decreased Interest 0 0 0  Down, Depressed, Hopeless 0 0 0  PHQ - 2 Score 0 0 0    Fall Risk  10/26/2020 12/23/2019 11/18/2019 10/23/2019 09/03/2019  Falls in the past year? 0 0 0 0 0  Number falls in past yr: 0 0 0 0 0  Injury with Fall? 0 0 0 0 0  Follow up Falls evaluation completed - Falls evaluation completed Falls evaluation completed Falls evaluation completed     Allergies  Allergen Reactions  . Tramadol Nausea And Vomiting  . Codeine Other (See Comments)    Headache  . Oxycodone Palpitations    Prior to Admission medications   Medication Sig Start Date End Date Taking? Authorizing Provider  acetaminophen (TYLENOL) 500 MG tablet Take 1,000 mg by mouth every 6 (six) hours as needed for moderate pain.    Yes [provider]  alendronate (FOSAMAX) 70 MG tablet Take 1 tablet (70 mg total) by mouth once a week. Take with a full glass of water on an empty stomach. 07/14/20  Yes Shamleffer,  Melanie Crazier, MD  amLODipine (NORVASC) 5 MG tablet Take 1 tablet (5 mg total) by mouth daily. 10/23/19  Yes Forrest Moron, MD  aspirin EC 81 MG tablet Take 1 tablet (81 mg total) by mouth in the morning and at bedtime. 06/09/20  Yes Aundra Dubin, PA-C  Cholecalciferol (VITAMIN D3 PO) Take by mouth.   Yes [provider]  famotidine (PEPCID) 20 MG tablet Take 20 mg by mouth daily.    Yes [provider]  fluticasone (FLONASE) 50 MCG/ACT nasal spray Place 2 sprays into both nostrils daily. 04/17/20  Yes Yu, Amy V, PA-C  naproxen sodium (ALEVE) 220 MG tablet Take 440 mg by mouth 2 (two) times daily as needed (pain).    Yes [provider]  simvastatin (ZOCOR) 40 MG tablet Take 1 tablet by mouth once daily with breakfast 10/23/19  Yes Stallings, Zoe A, MD  amoxicillin (AMOXIL) 500 MG capsule Take 4 caps one hour prior to dental work Patient not taking: No sig reported 04/29/20   Nathaniel Man    Past Medical History:  Diagnosis Date  . Allergy   . Arthritis   . Blood transfusion without reported diagnosis   . Cataract   . Cholelithiasis    large stone detected by CT 01/2016  .  GERD (gastroesophageal reflux disease)   . Hyperlipidemia   . Hypertension   . Nephrolithiasis    84mm kidney stone L by CT 01/2016  . Osteoporosis   . 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 ABDOMINAL HYSTERECTOMY    . APPENDECTOMY    . CHOLECYSTECTOMY    . COLONOSCOPY     [facility]- records purges and destroyed- per pt Normal   . SPINE SURGERY    . 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   Procedure: LEFT TOTAL HIP ARTHROPLASTY ANTERIOR APPROACH;  Surgeon: Leandrew Koyanagi, MD;  Location: [place];  Service: Orthopedics;  Laterality: Left;  . TOTAL HIP ARTHROPLASTY Right 06/15/2020   Procedure: RIGHT TOTAL HIP ARTHROPLASTY ANTERIOR APPROACH;  Surgeon: Leandrew Koyanagi, MD;   Location: [place];  Service: Orthopedics;  Laterality: Right;    Social History   Tobacco Use  . Smoking status: Former Smoker    Packs/day: 1.50    Years: 39.00    Pack years: 58.50    Start date: 52    Quit date: 09/08/2010    Years since quitting: 10.1  . Smokeless tobacco: Never Used  Substance Use Topics  . Alcohol use: No    Family History  Problem Relation Age of Onset  . Stroke Father   . Diabetes Father   . Heart disease Father 28       AMI  . Hypertension Sister   . Arthritis Brother   . Mental illness Mother   . Heart disease Paternal Uncle   . Colon cancer Neg Hx   . Colon polyps Neg Hx   . Esophageal cancer Neg Hx   . Rectal cancer Neg Hx   . Stomach cancer Neg Hx     Review of Systems  Constitutional: Negative for chills, fever and malaise/fatigue.  HENT: Positive for congestion, ear pain and sinus pain. Negative for ear discharge, hearing loss, sore throat and tinnitus.   Eyes: Negative for blurred vision and double vision.  Respiratory: Negative for cough, sputum production and shortness of breath.   Cardiovascular: Negative for chest pain, palpitations and leg swelling.  Musculoskeletal: Negative for neck pain.  Neurological: Negative for dizziness, sensory change, speech change and headaches.     OBJECTIVE:  Today's Vitals   10/26/20 1453  BP: (!) 152/76  Pulse: 78  Temp: 98.3 F (36.8 C)  TempSrc: Temporal  SpO2: 98%  Weight: 164 lb 6.4 oz (74.6 kg)  Height: 5\' 6"  (1.676 m)   Body mass index is 26.53 kg/m.   Physical Exam Constitutional:      General: She is not in acute distress.    Appearance: Normal appearance. She is not ill-appearing.  HENT:     Head: Normocephalic.     Right Ear: External ear normal. There is impacted cerumen (purple).     Left Ear: Tympanic membrane, ear canal and external ear normal. There is no impacted cerumen.  Cardiovascular:     Rate and Rhythm: Normal rate and regular rhythm.     Pulses: Normal  pulses.     Heart sounds: Normal heart sounds. No murmur heard. No friction rub. No gallop.   Pulmonary:     Effort: Pulmonary effort is normal. No respiratory distress.     Breath sounds: Normal breath sounds. No stridor. No wheezing, rhonchi or rales.  Abdominal:  General: Bowel sounds are normal.     Palpations: Abdomen is soft.     Tenderness: There is no abdominal tenderness.  Musculoskeletal:     Right lower leg: No edema.     Left lower leg: No edema.  Skin:    General: Skin is warm and dry.  Neurological:     Mental Status: She is alert and oriented to person, place, and time.  Psychiatric:        Mood and Affect: Mood normal.        Behavior: Behavior normal.    Post lavage right ear canal erythema and TM nl  No results found for this or any previous visit (from the past 24 hour(s)).  No results found.   ASSESSMENT and PLAN  Problem List Items Addressed This Visit   None   Visit Diagnoses    Impacted cerumen of right ear    -  Primary   Relevant Medications   NEOMYCIN-POLYMYXIN-HYDROCORTISONE (CORTISPORIN) 1 % SOLN OTIC solution   Other Relevant Orders   Ear wax removal (Completed)      Plan . OTC decongestant . Continue nasal spray . R/se/b of ear drops discussed . Will follow up with BP for goal< 130/80 . RTC/ED precautions provided . Discussed follow up with ENT as needed   Return if symptoms worsen or fail to improve.    Huston Foley Kylo Gavin, FNP-BC Primary Care at [facility] [place], [place] [address] Ph.  [phone] Fax [phone]

## 2020-10-27 ENCOUNTER — Encounter: Payer: Self-pay | Admitting: Family Medicine

## 2020-10-27 ENCOUNTER — Other Ambulatory Visit: Payer: Self-pay | Admitting: Family Medicine

## 2020-10-27 DIAGNOSIS — I10 Essential (primary) hypertension: Secondary | ICD-10-CM

## 2020-10-27 MED ORDER — AMLODIPINE BESYLATE 10 MG PO TABS: 10.0000 mg | ORAL_TABLET | Freq: Every day | ORAL | 3 refills | Status: DC

## 2020-10-28 ENCOUNTER — Encounter: Payer: Self-pay | Admitting: Certified Registered Nurse Anesthetist

## 2020-10-29 ENCOUNTER — Other Ambulatory Visit: Payer: Self-pay

## 2020-10-29 ENCOUNTER — Ambulatory Visit (AMBULATORY_SURGERY_CENTER): Payer: PPO | Admitting: Gastroenterology

## 2020-10-29 ENCOUNTER — Encounter: Payer: Self-pay | Admitting: Gastroenterology

## 2020-10-29 VITALS — BP 137/76 | HR 68 | Temp 98.7°F | Resp 29 | Ht 66.0 in | Wt 164.0 lb

## 2020-10-29 DIAGNOSIS — Z86010 Personal history of colonic polyps: Secondary | ICD-10-CM | POA: Diagnosis not present

## 2020-10-29 DIAGNOSIS — I10 Essential (primary) hypertension: Secondary | ICD-10-CM | POA: Diagnosis not present

## 2020-10-29 DIAGNOSIS — Z1211 Encounter for screening for malignant neoplasm of colon: Secondary | ICD-10-CM | POA: Diagnosis not present

## 2020-10-29 MED ORDER — SODIUM CHLORIDE 0.9 % IV SOLN: 500.0000 mL | Freq: Once | INTRAVENOUS | Status: DC

## 2020-10-29 NOTE — Patient Instructions (Signed)
Handouts given for Diverticulosis and Hemorrhoids.  No need for further colonoscopies due to age and no polyps this time.  YOU HAD AN ENDOSCOPIC PROCEDURE TODAY AT [facility]:   Refer to the procedure report that was given to you for any specific questions about what was found during the examination.  If the procedure report does not answer your questions, please call your gastroenterologist to clarify.  If you requested that your care partner not be given the details of your procedure findings, then the procedure report has been included in a sealed envelope for you to review at your convenience later.  YOU SHOULD EXPECT: Some feelings of bloating in the abdomen. Passage of more gas than usual.  Walking can help get rid of the air that was put into your GI tract during the procedure and reduce the bloating. If you had a lower endoscopy (such as a colonoscopy or flexible sigmoidoscopy) you may notice spotting of blood in your stool or on the toilet paper. If you underwent a bowel prep for your procedure, you may not have a normal bowel movement for a few days.  Please Note:  You might notice some irritation and congestion in your nose or some drainage.  This is from the oxygen used during your procedure.  There is no need for concern and it should clear up in a day or so.  SYMPTOMS TO REPORT IMMEDIATELY:   Following lower endoscopy (colonoscopy or flexible sigmoidoscopy):  Excessive amounts of blood in the stool  Significant tenderness or worsening of abdominal pains  Swelling of the abdomen that is new, acute  Fever of 100F or higher  For urgent or emergent issues, a gastroenterologist can be reached at any hour by calling [phone]. Do not use MyChart messaging for urgent concerns.    DIET:  We do recommend a small meal at first, but then you may proceed to your regular diet.  Drink plenty of fluids but you should avoid alcoholic beverages for 24 hours.  ACTIVITY:   You should plan to take it easy for the rest of today and you should NOT DRIVE or use heavy machinery until tomorrow (because of the sedation medicines used during the test).    FOLLOW UP: Our staff will call the number listed on your records 48-72 hours following your procedure to check on you and address any questions or concerns that you may have regarding the information given to you following your procedure. If we do not reach you, we will leave a message.  We will attempt to reach you two times.  During this call, we will ask if you have developed any symptoms of COVID 19. If you develop any symptoms (ie: fever, flu-like symptoms, shortness of breath, cough etc.) before then, please call [phone].  If you test positive for Covid 19 in the 2 weeks post procedure, please call and report this information to [place].    If any biopsies were taken you will be contacted by phone or by letter within the next 1-3 weeks.  Please call us at [phone] if you have not heard about the biopsies in 3 weeks.    SIGNATURES/CONFIDENTIALITY: You and/or your care partner have signed paperwork which will be entered into your electronic medical record.  These signatures attest to the fact that that the information above on your After Visit Summary has been reviewed and is understood.  Full responsibility of the confidentiality of this discharge information lies with you and/or  your care-partner. 

## 2020-10-29 NOTE — Op Note (Signed)
[facility] Patient Name: Lori Cook Procedure Date: 10/29/2020 9:09 AM MRN: [id number] Endoscopist: Remo Lipps P. Havery Moros , MD Age: 70 Referring MD:  Date of Birth: [birth year] Gender: Female Account #: [id number] Procedure:                Colonoscopy Indications:              High risk colon cancer surveillance: Personal                            history of colonic polyps (4 small adenomas removed                            in 08/2017) Medicines:                Monitored Anesthesia Care Procedure:                Pre-Anesthesia Assessment:                           - Prior to the procedure, a History and Physical                            was performed, and patient medications and                            allergies were reviewed. The patient's tolerance of                            previous anesthesia was also reviewed. The risks                            and benefits of the procedure and the sedation                            options and risks were discussed with the patient.                            All questions were answered, and informed consent                            was obtained. Prior Anticoagulants: The patient has                            taken no previous anticoagulant or antiplatelet                            agents. ASA Grade Assessment: III - A patient with                            severe systemic disease. After reviewing the risks                            and benefits, the patient was deemed in  satisfactory condition to undergo the procedure.                           After obtaining informed consent, the colonoscope                            was passed under direct vision. Throughout the                            procedure, the patient's blood pressure, pulse, and                            oxygen saturations were monitored continuously. The                            Olympus PCF-H190DL [id number]) Colonoscope  was                            introduced through the anus and advanced to the the                            cecum, identified by appendiceal orifice and                            ileocecal valve. The colonoscopy was performed                            without difficulty. The patient tolerated the                            procedure well. The quality of the bowel                            preparation was good. The ileocecal valve,                            appendiceal orifice, and rectum were photographed. Scope In: 9:14:13 AM Scope Out: 9:39:09 AM Scope Withdrawal Time: 0 hours 18 minutes 10 seconds  Total Procedure Duration: 0 hours 24 minutes 56 seconds  Findings:                 The perianal and digital rectal examinations were                            normal.                           A few small-mouthed diverticula were found in the                            sigmoid colon.                           The colon was tortuous. The rectosigmoid colon was  extremely angulated and visualization was difficult                            in this area.                           Internal hemorrhoids were found during                            retroflexion. The hemorrhoids were small.                           The exam was otherwise without abnormality. Complications:            No immediate complications. Estimated blood loss:                            None. Estimated Blood Loss:     Estimated blood loss: none. Impression:               - Diverticulosis in the sigmoid colon.                           - Tortuous colon with angulated rectosigmoid.                           - Internal hemorrhoids.                           - The examination was otherwise normal.                           - No polyp Recommendation:           - Patient has a contact number available for                            emergencies. The signs and symptoms of potential                             delayed complications were discussed with the                            patient. Return to normal activities tomorrow.                            Written discharge instructions were provided to the                            patient.                           - Resume previous diet.                           - Continue present medications.                           -  Patient would not be due for repeat colonoscopy                            in 10 years for screening purposes, at that time                            the patient will be 70 years old, so likely no                            further screening is warranted. Remo Lipps P. Surya Schroeter, MD 10/29/2020 9:48:23 AM This report has been signed electronically.

## 2020-10-29 NOTE — Progress Notes (Addendum)
Pt walking around in recovery, but still c/o 8/10 RUQ pain.  No relief from Levsin.  Placed in bed and percussion done to back.  Placed on all fours and rocking back and forth.  1046: Pt passed large amount of gas and pain subsided to 3/10.  Advised to drink warm liquids and continue moving around to relieve gas.  Pt agreed.

## 2020-10-29 NOTE — Progress Notes (Signed)
Report given to PACU, vss 

## 2020-11-02 ENCOUNTER — Telehealth: Payer: Self-pay | Admitting: *Deleted

## 2020-11-02 NOTE — Telephone Encounter (Signed)
  Follow up Call-  Call back number 10/29/2020  Post procedure Call Back phone  # [phone]  Permission to leave phone message Yes  Some recent data might be hidden     Patient questions:  Do you have a fever, pain , or abdominal swelling? No. Pain Score  0  Have you tolerated food without any problems? Yes.    Have you been able to return to your normal activities? Yes.    Do you have any questions about your discharge instructions: Diet   No. Medications  No. Follow up visit  No.  Do you have questions or concerns about your Care? No.  Actions: * If pain score is 4 or above: No action needed, pain <4.  1. Have you developed a fever since your procedure? no  2.   Have you had an respiratory symptoms (SOB or cough) since your procedure? no  3.   Have you tested positive for COVID 19 since your procedure no  4.   Have you had any family members/close contacts diagnosed with the COVID 19 since your procedure?  no   If yes to any of these questions please route to Joylene John, RN and Joella Prince, RN

## 2020-11-03 ENCOUNTER — Ambulatory Visit (INDEPENDENT_AMBULATORY_CARE_PROVIDER_SITE_OTHER): Payer: PPO | Admitting: Family Medicine

## 2020-11-03 ENCOUNTER — Other Ambulatory Visit: Payer: Self-pay

## 2020-11-03 ENCOUNTER — Encounter: Payer: Self-pay | Admitting: Family Medicine

## 2020-11-03 ENCOUNTER — Other Ambulatory Visit: Payer: Self-pay | Admitting: Family Medicine

## 2020-11-03 VITALS — BP 150/80 | HR 81 | Temp 98.3°F | Ht 66.0 in | Wt 162.2 lb

## 2020-11-03 DIAGNOSIS — I10 Essential (primary) hypertension: Secondary | ICD-10-CM

## 2020-11-03 DIAGNOSIS — E559 Vitamin D deficiency, unspecified: Secondary | ICD-10-CM | POA: Diagnosis not present

## 2020-11-03 DIAGNOSIS — E785 Hyperlipidemia, unspecified: Secondary | ICD-10-CM | POA: Diagnosis not present

## 2020-11-03 DIAGNOSIS — D649 Anemia, unspecified: Secondary | ICD-10-CM

## 2020-11-03 MED ORDER — SIMVASTATIN 40 MG PO TABS: ORAL_TABLET | ORAL | 3 refills | Status: DC

## 2020-11-03 MED ORDER — HYDROCHLOROTHIAZIDE 25 MG PO TABS: 25.0000 mg | ORAL_TABLET | Freq: Every day | ORAL | 3 refills | Status: DC

## 2020-11-03 NOTE — Progress Notes (Signed)
2/22/20224:35 PM  Lori Cook 09-15-1950, 70 y.o., female [id number]  Chief Complaint  Patient presents with  . Medication Problem    Medications and Blood work pended.    HPI:   Patient is a 70 y.o. female with past medical history significant for HTN, allergies, HLD who presents today for medication refills.  HTN Last OV amlodipine increased from 5 mg to 10 mg Continues to have high readings at home Occassionally has headaches with high readings BP Readings from Last 3 Encounters:  11/03/20 (!) 150/80  10/29/20 137/76  10/26/20 (!) 152/80     Depression screen PHQ 2/9 11/03/2020 10/26/2020 12/23/2019  Decreased Interest 0 0 0  Down, Depressed, Hopeless 0 0 0  PHQ - 2 Score 0 0 0    Fall Risk  11/03/2020 10/26/2020 12/23/2019 11/18/2019 10/23/2019  Falls in the past year? 0 0 0 0 0  Number falls in past yr: 0 0 0 0 0  Injury with Fall? 0 0 0 0 0  Follow up Falls evaluation completed Falls evaluation completed - Falls evaluation completed Falls evaluation completed     Allergies  Allergen Reactions  . Tramadol Nausea And Vomiting  . Codeine Other (See Comments)    Headache  . Oxycodone Palpitations    Prior to Admission medications   Medication Sig Start Date End Date Taking? Authorizing Provider  acetaminophen (TYLENOL) 500 MG tablet Take 1,000 mg by mouth every 6 (six) hours as needed for moderate pain.     [provider]  alendronate (FOSAMAX) 70 MG tablet Take 1 tablet (70 mg total) by mouth once a week. Take with a full glass of water on an empty stomach. 07/14/20   Shamleffer, Melanie Crazier, MD  amLODipine (NORVASC) 10 MG tablet Take 1 tablet (10 mg total) by mouth daily. 10/27/20   Adylee Leonardo, Laurita Quint, FNP  aspirin EC 81 MG tablet Take 1 tablet (81 mg total) by mouth in the morning and at bedtime. 06/09/20   Aundra Dubin, PA-C  Cholecalciferol (VITAMIN D3 PO) Take by mouth.    [provider]  famotidine (PEPCID) 20 MG tablet Take 20 mg by  mouth daily.     [provider]  fluticasone (FLONASE) 50 MCG/ACT nasal spray Place 2 sprays into both nostrils daily. 04/17/20   Tasia Catchings, Amy V, PA-C  naproxen sodium (ALEVE) 220 MG tablet Take 440 mg by mouth 2 (two) times daily as needed (pain).     [provider]  simvastatin (ZOCOR) 40 MG tablet Take 1 tablet by mouth once daily with breakfast 11/03/20   Herbie Lehrmann, Laurita Quint, FNP    Past Medical History:  Diagnosis Date  . Allergy   . Arthritis   . Blood transfusion without reported diagnosis   . Cataract   . Cholelithiasis    large stone detected by CT 01/2016  . GERD (gastroesophageal reflux disease)   . Hyperlipidemia   . Hypertension   . Nephrolithiasis    75m kidney stone L by CT 01/2016  . Osteoporosis   . 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 ABDOMINAL HYSTERECTOMY    . APPENDECTOMY    . CHOLECYSTECTOMY    . COLONOSCOPY     [facility]- records purges and destroyed- per pt Normal   . SPINE SURGERY    . 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  Procedure: LEFT TOTAL HIP ARTHROPLASTY ANTERIOR APPROACH;  Surgeon: Leandrew Koyanagi, MD;  Location: [place];  Service: Orthopedics;  Laterality: Left;  . TOTAL HIP ARTHROPLASTY Right 06/15/2020   Procedure: RIGHT TOTAL HIP ARTHROPLASTY ANTERIOR APPROACH;  Surgeon: Leandrew Koyanagi, MD;  Location: [place];  Service: Orthopedics;  Laterality: Right;    Social History   Tobacco Use  . Smoking status: Former Smoker    Packs/day: 1.50    Years: 39.00    Pack years: 58.50    Start date: 107    Quit date: 09/08/2010    Years since quitting: 10.1  . Smokeless tobacco: Never Used  Substance Use Topics  . Alcohol use: No    Family History  Problem Relation Age of Onset  . Stroke Father   . Diabetes Father   . Heart disease Father 68       AMI  . Hypertension Sister   . Arthritis Brother   . Mental illness Mother   . Heart  disease Paternal Uncle   . Colon cancer Neg Hx   . Colon polyps Neg Hx   . Esophageal cancer Neg Hx   . Rectal cancer Neg Hx   . Stomach cancer Neg Hx     Review of Systems  Constitutional: Negative for chills, fever and malaise/fatigue.  Eyes: Negative for blurred vision and double vision.  Respiratory: Negative for cough, shortness of breath and wheezing.   Cardiovascular: Negative for chest pain, palpitations and leg swelling.  Gastrointestinal: Negative for abdominal pain, blood in stool, constipation, diarrhea, heartburn, nausea and vomiting.  Genitourinary: Negative for dysuria, frequency and hematuria.  Musculoskeletal: Negative for back pain and joint pain.  Skin: Negative for rash.  Neurological: Negative for dizziness, weakness and headaches.     OBJECTIVE:  Today's Vitals   11/03/20 1607 11/03/20 1611  BP: (!) 152/80 (!) 150/80  Pulse: 81   Temp: 98.3 F (36.8 C)   TempSrc: Temporal   SpO2: 97%   Weight: 162 lb 3.2 oz (73.6 kg)   Height: '5\' 6"'  (1.676 m)    Body mass index is 26.18 kg/m.   Physical Exam Constitutional:      General: She is not in acute distress.    Appearance: Normal appearance. She is not ill-appearing.  HENT:     Head: Normocephalic.     Right Ear: Tympanic membrane, ear canal and external ear normal. There is no impacted cerumen.     Left Ear: Tympanic membrane, ear canal and external ear normal. There is no impacted cerumen.  Cardiovascular:     Rate and Rhythm: Normal rate and regular rhythm.     Pulses: Normal pulses.     Heart sounds: Normal heart sounds. No murmur heard. No friction rub. No gallop.   Pulmonary:     Effort: Pulmonary effort is normal. No respiratory distress.     Breath sounds: Normal breath sounds. No stridor. No wheezing, rhonchi or rales.  Abdominal:     General: Bowel sounds are normal.     Palpations: Abdomen is soft.     Tenderness: There is no abdominal tenderness.  Musculoskeletal:     Right lower  leg: No edema.     Left lower leg: No edema.  Skin:    General: Skin is warm and dry.  Neurological:     Mental Status: She is alert and oriented to person, place, and time.  Psychiatric:        Mood and Affect: Mood normal.  Behavior: Behavior normal.     No results found for this or any previous visit (from the past 24 hour(s)).  No results found.   ASSESSMENT and PLAN  Problem List Items Addressed This Visit      Cardiovascular and Mediastinum   Essential hypertension, benign   Relevant Medications   simvastatin (ZOCOR) 40 MG tablet   hydrochlorothiazide (HYDRODIURIL) 25 MG tablet   Other Relevant Orders   CMP14+EGFR     Other   Vitamin D insufficiency - Primary   Relevant Orders   Vitamin D, 25-hydroxy    Other Visit Diagnoses    Hyperlipidemia, unspecified hyperlipidemia type       Relevant Medications   simvastatin (ZOCOR) 40 MG tablet   hydrochlorothiazide (HYDRODIURIL) 25 MG tablet   Other Relevant Orders   Lipid panel   Hemoglobin A1c   Anemia, unspecified type       Relevant Orders   CBC with Differential     Plan . Add HCTZ to BP regimen with goal< 130/80 . Medication refills sent . Will follow up with lab results   Return in about 3 months (around 01/31/2021).   Huston Foley Seniah Lawrence, FNP-BC [facility], [place] [address] Ph.  [phone] Fax [phone]

## 2020-11-03 NOTE — Patient Instructions (Addendum)
  Hypertension, Adult Hypertension is another name for high blood pressure. High blood pressure forces your heart to work harder to pump blood. This can cause problems over time. There are two numbers in a blood pressure reading. There is a top number (systolic) over a bottom number (diastolic). It is best to have a blood pressure that is below 120/80. Healthy choices can help lower your blood pressure, or you may need medicine to help lower it. What are the causes? The cause of this condition is not known. Some conditions may be related to high blood pressure. What increases the risk?  Smoking.  Having type 2 diabetes mellitus, high cholesterol, or both.  Not getting enough exercise or physical activity.  Being overweight.  Having too much fat, sugar, calories, or salt (sodium) in your diet.  Drinking too much alcohol.  Having long-term (chronic) kidney disease.  Having a family history of high blood pressure.  Age. Risk increases with age.  Race. You may be at higher risk if you are African American.  Gender. Men are at higher risk than women before age 45. After age 65, women are at higher risk than men.  Having obstructive sleep apnea.  Stress. What are the signs or symptoms?  High blood pressure may not cause symptoms. Very high blood pressure (hypertensive crisis) may cause: ? Headache. ? Feelings of worry or nervousness (anxiety). ? Shortness of breath. ? Nosebleed. ? A feeling of being sick to your stomach (nausea). ? Throwing up (vomiting). ? Changes in how you see. ? Very bad chest pain. ? Seizures. How is this treated?  This condition is treated by making healthy lifestyle changes, such as: ? Eating healthy foods. ? Exercising more. ? Drinking less alcohol.  Your health care provider may prescribe medicine if lifestyle changes are not enough to get your blood pressure under control, and if: ? Your top number is above 130. ? Your bottom number is  above 80.  Your personal target blood pressure may vary. Follow these instructions at home: Eating and drinking  If told, follow the DASH eating plan. To follow this plan: ? Fill one half of your plate at each meal with fruits and vegetables. ? Fill one fourth of your plate at each meal with whole grains. Whole grains include whole-wheat pasta, brown rice, and whole-grain bread. ? Eat or drink low-fat dairy products, such as skim milk or low-fat yogurt. ? Fill one fourth of your plate at each meal with low-fat (lean) proteins. Low-fat proteins include fish, chicken without skin, eggs, beans, and tofu. ? Avoid fatty meat, cured and processed meat, or chicken with skin. ? Avoid pre-made or processed food.  Eat less than 1,500 mg of salt each day.  Do not drink alcohol if: ? Your doctor tells you not to drink. ? You are pregnant, may be pregnant, or are planning to become pregnant.  If you drink alcohol: ? Limit how much you use to:  0-1 drink a day for women.  0-2 drinks a day for men. ? Be aware of how much alcohol is in your drink. In the [place] one drink equals one 12 oz bottle of beer (355 mL), one 5 oz glass of wine (148 mL), or one 1 oz glass of hard liquor (44 mL).   Lifestyle  Work with your doctor to stay at a healthy weight or to lose weight. Ask your doctor what the best weight is for you.  Get at least 30 minutes of   exercise most days of the week. This may include walking, swimming, or biking.  Get at least 30 minutes of exercise that strengthens your muscles (resistance exercise) at least 3 days a week. This may include lifting weights or doing Pilates.  Do not use any products that contain nicotine or tobacco, such as cigarettes, e-cigarettes, and chewing tobacco. If you need help quitting, ask your doctor.  Check your blood pressure at home as told by your doctor.  Keep all follow-up visits as told by your doctor. This is important.   Medicines  Take  over-the-counter and prescription medicines only as told by your doctor. Follow directions carefully.  Do not skip doses of blood pressure medicine. The medicine does not work as well if you skip doses. Skipping doses also puts you at risk for problems.  Ask your doctor about side effects or reactions to medicines that you should watch for. Contact a doctor if you:  Think you are having a reaction to the medicine you are taking.  Have headaches that keep coming back (recurring).  Feel dizzy.  Have swelling in your ankles.  Have trouble with your vision. Get help right away if you:  Get a very bad headache.  Start to feel mixed up (confused).  Feel weak or numb.  Feel faint.  Have very bad pain in your: ? Chest. ? Belly (abdomen).  Throw up more than once.  Have trouble breathing. Summary  Hypertension is another name for high blood pressure.  High blood pressure forces your heart to work harder to pump blood.  For most people, a normal blood pressure is less than 120/80.  Making healthy choices can help lower blood pressure. If your blood pressure does not get lower with healthy choices, you may need to take medicine. This information is not intended to replace advice given to you by your health care provider. Make sure you discuss any questions you have with your health care provider. Document Revised: 05/09/2018 Document Reviewed: 05/09/2018 Elsevier Patient Education  2021 Elsevier Inc.   If you have lab work done today you will be contacted with your lab results within the next 2 weeks.  If you have not heard from us then please contact us. The fastest way to get your results is to register for My Chart.   IF you received an x-ray today, you will receive an invoice from [facility]. Please contact [facility] at [phone] with questions or concerns regarding your invoice.   IF you received labwork today, you will receive an invoice from  [facility]. Please contact [facility] at [phone] with questions or concerns regarding your invoice.   Our billing staff will not be able to assist you with questions regarding bills from these companies.  You will be contacted with the lab results as soon as they are available. The fastest way to get your results is to activate your My Chart account. Instructions are located on the last page of this paperwork. If you have not heard from us regarding the results in 2 weeks, please contact this office.      

## 2020-11-03 NOTE — Telephone Encounter (Signed)
Copied from [place] [id number]. Topic: Quick Communication - Rx Refill/Question >> Nov 03, 2020  9:19 AM Tessa Lerner A wrote: Medication: simvastatin (ZOCOR) 40 MG tablet - patient has 2(two) pills left  Has the patient contacted their pharmacy? Yes, patient was directed to contact PCP  Preferred Pharmacy (with phone number or street name): [place], [place]  Phone:  [phone]  Agent: Please be advised that RX refills may take up to 3 business days. We ask that you follow-up with your pharmacy.

## 2020-11-03 NOTE — Telephone Encounter (Signed)
   Notes to clinic: Patient has appt today   Requested Prescriptions  Pending Prescriptions Disp Refills   simvastatin (ZOCOR) 40 MG tablet 90 tablet 3    Sig: Take 1 tablet by mouth once daily with breakfast      Cardiovascular:  Antilipid - Statins Failed - 11/03/2020  9:30 AM      Failed - Total Cholesterol in normal range and within 360 days    Cholesterol, Total  Date Value Ref Range Status  10/23/2019 157 100 - 199 mg/dL Final          Failed - LDL in normal range and within 360 days    LDL Chol Calc (NIH)  Date Value Ref Range Status  10/23/2019 77 0 - 99 mg/dL Final          Failed - HDL in normal range and within 360 days    HDL  Date Value Ref Range Status  10/23/2019 50 >39 mg/dL Final          Failed - Triglycerides in normal range and within 360 days    Triglycerides  Date Value Ref Range Status  10/23/2019 180 (H) 0 - 149 mg/dL Final          Passed - Patient is not pregnant      Passed - Valid encounter within last 12 months    Recent Outpatient Visits           1 week ago Impacted cerumen of right ear   [facility], Laurita Quint, FNP   10 months ago Medicare annual wellness visit, subsequent   [facility] Care at [facility], Arlie Solomons, MD   11 months ago Preop examination   [facility] at [facility], Arlie Solomons, MD   1 year ago Osteopenia of neck of right femur   [facility] at [facility], Arlie Solomons, MD   1 year ago Essential hypertension, benign   [facility] at [facility], Arlie Solomons, MD       Future Appointments             Today [facility], Laurita Quint, [facility], [facility]   In 7 months Leandrew Koyanagi, MD [facility]

## 2020-11-04 LAB — LIPID PANEL
Chol/HDL Ratio: 3.5 ratio (ref 0.0–4.4)
Cholesterol, Total: 190 mg/dL (ref 100–199)
HDL: 54 mg/dL (ref >39)
LDL Chol Calc (NIH): 90 mg/dL (ref 0–99)
Triglycerides: 279 mg/dL — ABNORMAL HIGH (ref 0–149)
VLDL Cholesterol Cal: 46 mg/dL — ABNORMAL HIGH (ref 5–40)

## 2020-11-04 LAB — CBC WITH DIFFERENTIAL/PLATELET
Basophils Absolute: 0.1 10*3/uL (ref 0.0–0.2)
Basos: 1 %
EOS (ABSOLUTE): 0.2 10*3/uL (ref 0.0–0.4)
Eos: 2 %
Hematocrit: 41.3 % (ref 34.0–46.6)
Hemoglobin: 13.7 g/dL (ref 11.1–15.9)
Immature Grans (Abs): 0 10*3/uL (ref 0.0–0.1)
Immature Granulocytes: 0 %
Lymphocytes Absolute: 3.6 10*3/uL — ABNORMAL HIGH (ref 0.7–3.1)
Lymphs: 35 %
MCH: 27.7 pg (ref 26.6–33.0)
MCHC: 33.2 g/dL (ref 31.5–35.7)
MCV: 83 fL (ref 79–97)
Monocytes Absolute: 1.1 10*3/uL — ABNORMAL HIGH (ref 0.1–0.9)
Monocytes: 11 %
Neutrophils Absolute: 5.2 10*3/uL (ref 1.4–7.0)
Neutrophils: 51 %
Platelets: 340 10*3/uL (ref 150–450)
RBC: 4.95 x10E6/uL (ref 3.77–5.28)
RDW: 14.6 % (ref 11.7–15.4)
WBC: 10.1 10*3/uL (ref 3.4–10.8)

## 2020-11-04 LAB — CMP14+EGFR
ALT: 19 IU/L (ref 0–32)
AST: 23 IU/L (ref 0–40)
Albumin/Globulin Ratio: 2 (ref 1.2–2.2)
Albumin: 4.8 g/dL (ref 3.8–4.8)
Alkaline Phosphatase: 98 IU/L (ref 44–121)
BUN/Creatinine Ratio: 22 (ref 12–28)
BUN: 20 mg/dL (ref 8–27)
Bilirubin Total: 0.3 mg/dL (ref 0.0–1.2)
CO2: 20 mmol/L (ref 20–29)
Calcium: 10.7 mg/dL — ABNORMAL HIGH (ref 8.7–10.3)
Chloride: 101 mmol/L (ref 96–106)
Creatinine, Ser: 0.89 mg/dL (ref 0.57–1.00)
GFR calc Af Amer: 77 mL/min/{1.73_m2} (ref >59)
GFR calc non Af Amer: 67 mL/min/{1.73_m2} (ref >59)
Globulin, Total: 2.4 g/dL (ref 1.5–4.5)
Glucose: 92 mg/dL (ref 65–99)
Potassium: 4.4 mmol/L (ref 3.5–5.2)
Sodium: 139 mmol/L (ref 134–144)
Total Protein: 7.2 g/dL (ref 6.0–8.5)

## 2020-11-04 LAB — HEMOGLOBIN A1C
Est. average glucose Bld gHb Est-mCnc: 120 mg/dL
Hgb A1c MFr Bld: 5.8 % — ABNORMAL HIGH (ref 4.8–5.6)

## 2020-11-04 LAB — VITAMIN D 25 HYDROXY (VIT D DEFICIENCY, FRACTURES): Vit D, 25-Hydroxy: 29.5 ng/mL — ABNORMAL LOW (ref 30.0–100.0)

## 2021-01-10 NOTE — Progress Notes (Signed)
Subjective:    Lori Cook - 70 y.o. female MRN [id number]  Date of birth: 1951-06-12  HPI  Lori Cook is to establish care. Patient has a PMH significant for essential hypertension, retinal vein occlusion branch, stroke, allergic rhinitis due to pollen, cholelithiasis, gastroesophageal reflux disease, arthritis, osteoporosis, nephrolithiasis, hyperlipidemia, hypercalcemia, vitamin D deficiency, and pulmonary nodules .   Current issues and/or concerns: Intermittent sharp and tingling bilateral leg pain for several months. Radiates to midline back. Endorses muscle spasms. Denies falls, injury, and trauma. Denies leg swelling, leg tenderness, shortness of breath, and chest pain. Does a lot of standing and housekeeping. Not ready to begin prescription medications as of yet stating she does not prefer to take medication.    ROS per HPI    Health Maintenance:  Health Maintenance Due  Topic Date Due  . COVID-19 Vaccine (3 - Booster for [employer] series) 05/21/2020     Past Medical History: Patient Active Problem List   Diagnosis Date Noted  . Status post total replacement of right hip 06/15/2020  . Primary osteoarthritis of right hip 06/14/2020  . Vitamin D insufficiency 01/09/2020  . Osteoporosis without current pathological fracture 01/07/2020  . Hypercalcemia 01/07/2020  . Status post total replacement of left hip 11/25/2019  . Calculus of gallbladder without cholecystitis without obstruction 05/07/2016  . Nephrolithiasis 05/07/2016  . Pulmonary nodules 05/07/2016  . Hematuria, microscopic 05/07/2016  . Essential hypertension, benign 04/24/2015  . Allergic rhinitis due to pollen 04/24/2015  . Retinal vein occlusion, branch 04/24/2015  . Retinal vein occlusion 03/08/2015  . Essential hypertension-new onset 03/08/2015  . Hyperlipidemia with target LDL less than 130 09/09/2012    Social History   reports that she quit smoking about 10 years ago. She started smoking  about 50 years ago. She has a 58.50 pack-year smoking history. She has never used smokeless tobacco. She reports that she does not drink alcohol and does not use drugs.   Family History  family history includes Arthritis in her brother; Diabetes in her father; Heart disease in her paternal uncle; Heart disease (age of onset: 68) in her father; Hypertension in her sister; Mental illness in her mother; Stroke in her father.   Medications: reviewed and updated   Objective:  Physical Exam HENT:     Head: Normocephalic and atraumatic.  Eyes:     Extraocular Movements: Extraocular movements intact.     Conjunctiva/sclera: Conjunctivae normal.     Pupils: Pupils are equal, round, and reactive to light.  Cardiovascular:     Pulses: Normal pulses.     Heart sounds: Normal heart sounds.  Pulmonary:     Effort: Pulmonary effort is normal.     Breath sounds: Normal breath sounds.  Musculoskeletal:     Cervical back: Normal range of motion and neck supple.  Neurological:     General: No focal deficit present.     Mental Status: She is alert and oriented to person, place, and time.  Psychiatric:        Mood and Affect: Mood normal.        Behavior: Behavior normal.      Assessment & Plan:  1. Encounter to establish care: - Patient presents today to establish care.  - Return for annual physical examination, labs, and health maintenance. Arrive fasting meaning having no for at least 8 hours prior to appointment. You may have only water or black coffee. Please take scheduled medications as normal.  2. Chronic pain of both lower  extremities: - Patient declined pharmacological management at this time. - Follow-up with primary provider as scheduled.     Patient was given clear instructions to go to Emergency Department or return to medical center if symptoms don't improve, worsen, or new problems develop.The patient verbalized understanding.  I discussed the assessment and treatment plan  with the patient. The patient was provided an opportunity to ask questions and all were answered. The patient agreed with the plan and demonstrated an understanding of the instructions.   The patient was advised to call back or seek an in-person evaluation if the symptoms worsen or if the condition fails to improve as anticipated.    Durene Fruits, NP 01/14/2021, 8:15 AM Primary Care at [facility]

## 2021-01-11 ENCOUNTER — Ambulatory Visit (INDEPENDENT_AMBULATORY_CARE_PROVIDER_SITE_OTHER): Payer: PPO | Admitting: Family

## 2021-01-11 ENCOUNTER — Encounter: Payer: Self-pay | Admitting: Family

## 2021-01-11 ENCOUNTER — Other Ambulatory Visit: Payer: Self-pay

## 2021-01-11 DIAGNOSIS — Z7689 Persons encountering health services in other specified circumstances: Secondary | ICD-10-CM

## 2021-01-11 DIAGNOSIS — M79605 Pain in left leg: Secondary | ICD-10-CM

## 2021-01-11 DIAGNOSIS — G8929 Other chronic pain: Secondary | ICD-10-CM

## 2021-01-11 DIAGNOSIS — M79604 Pain in right leg: Secondary | ICD-10-CM

## 2021-01-11 NOTE — Patient Instructions (Addendum)
- Return for annual physical examination, labs, and health maintenance. Arrive fasting meaning having no for at least 8 hours prior to appointment. You may have only water or black coffee. Please take scheduled medications as normal. Thank you for choosing Primary Care at [facility] for your medical home!    Lori Cook was seen by Camillia Herter, NP today.   Lori Cook's primary care provider is Camillia Herter, NP.   For the best care possible,  you should try to see Durene Fruits, NP whenever you come to clinic.   We look forward to seeing you again soon!  If you have any questions about your visit today,  please call us at [phone]  Or feel free to reach your provider via [place].    Keeping you healthy   Get these tests  Blood pressure- Have your blood pressure checked once a year by your healthcare provider.  Normal blood pressure is 120/80.  Weight- Have your body mass index (BMI) calculated to screen for obesity.  BMI is a measure of body fat based on height and weight. You can also calculate your own BMI at [employer].it.  Cholesterol- Have your cholesterol checked regularly starting at age 79, sooner may be necessary if you have diabetes, high blood pressure, if a family member developed heart diseases at an early age or if you smoke.   Chlamydia, HIV, and other sexual transmitted disease- Get screened each year until the age of 87 then within three months of each new sexual partner.  Diabetes- Have your blood sugar checked regularly if you have high blood pressure, high cholesterol, a family history of diabetes or if you are overweight.   Get these vaccines  Flu shot- Every fall.  Tetanus shot- Every 10 years.  Menactra- Single dose; prevents meningitis.   Take these steps  Don't smoke- If you do smoke, ask your healthcare provider about quitting. For tips on how to quit, go to www.smokefree.gov or call 1-800-QUIT-NOW.  Be  physically active- Exercise 5 days a week for at least 30 minutes.  If you are not already physically active start slow and gradually work up to 30 minutes of moderate physical activity.  Examples of moderate activity include walking briskly, mowing the yard, dancing, swimming bicycling, etc.  Eat a healthy diet- Eat a variety of healthy foods such as fruits, vegetables, low fat milk, low fat cheese, yogurt, lean meats, poultry, fish, beans, tofu, etc.  For more information on healthy eating, go to www.thenutritionsource.org  Drink alcohol in moderation- Limit alcohol intake two drinks or less a day.  Never drink and drive.  Dentist- Brush and floss teeth twice daily; visit your dentis twice a year.  Depression-Your emotional health is as important as your physical health.  If you're feeling down, losing interest in things you normally enjoy please talk with your healthcare provider.  Gun Safety- If you keep a gun in your home, keep it unloaded and with the safety lock on.  Bullets should be stored separately.  Helmet use- Always wear a helmet when riding a motorcycle, bicycle, rollerblading or skateboarding.  Safe sex- If you may be exposed to a sexually transmitted infection, use a condom  Seat belts- Seat bels can save your life; always wear one.  Smoke/Carbon Monoxide detectors- These detectors need to be installed on the appropriate level of your home.  Replace batteries at least once a year.  Skin Cancer- When out in the sun, cover up and  use sunscreen SPF 15 or higher.  Violence- If anyone is threatening or hurting you, please tell your healthcare provider.

## 2021-01-11 NOTE — Progress Notes (Signed)
Establish care  Pt experiencing sharp tingling pains in legs that comes and goes Back spasm

## 2021-01-29 ENCOUNTER — Other Ambulatory Visit: Payer: Self-pay | Admitting: Internal Medicine

## 2021-02-01 ENCOUNTER — Ambulatory Visit: Payer: PPO | Admitting: Family Medicine

## 2021-02-21 ENCOUNTER — Other Ambulatory Visit: Payer: Self-pay

## 2021-02-21 DIAGNOSIS — R7303 Prediabetes: Secondary | ICD-10-CM | POA: Insufficient documentation

## 2021-02-21 NOTE — Progress Notes (Addendum)
Medicare Annual Wellness Visit, Subsequent   Lori Cook is a 70 y.o. female who presents for a Medicare Annual Wellness Visit, Subsequent exam.   HYPERTENSION FOLLOW-UP: 11/03/2020 per NP note: - Continue Amlodipine. - Adding Hydrochlorothiazide.  02/22/2021: Currently taking: see medication list Med Adherence: [x]  Yes    []  No Medication side effects: [x]  Yes, bilateral ankle swelling, does not happen very often. Noticed initially 1 week ago. 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 Denies lower extremity pain, tenderness, swelling, shortness of breath and chest pain. Once she rests with elevation of the legs does improve, none since several days ago. Smoking []  Yes [x]  No SOB? []  Yes    [x]  No Chest Pain?: []  Yes    [x]  No Leg swelling?: []  Yes    [x]  No  2. PREDIABETES FOLLOW-UP: Last A1c: 5.8% on 11/03/2020 Diet Adherence: [x]  Yes    []  No Exercise: [x]  Yes    []  No  3. HYPERLIPIDEMIA FOLLOW-UP: 11/03/2020 per NP note: - Continue Simvastatin.   02/22/2021: Med Adherence: [x]  Yes    []  No Medication side effects: []  Yes    [x]  No Muscle aches:  []  Yes    [x]  No  4. SKIN LESION: Reports present for some time on right lower leg. Recently began to worsen and have pain. Does have an appointment scheduled with Dermatology soon.  Patient Active Problem List   Diagnosis Date Noted   Prediabetes 02/21/2021   Status post total replacement of right hip 06/15/2020   Primary osteoarthritis of right hip 06/14/2020   Vitamin D insufficiency 01/09/2020   Osteoporosis without current pathological fracture 01/07/2020   Hypercalcemia 01/07/2020   Status post total replacement of left hip 11/25/2019   Calculus of gallbladder without cholecystitis without obstruction 05/07/2016   Nephrolithiasis 05/07/2016   Pulmonary nodules 05/07/2016   Hematuria, microscopic 05/07/2016   Essential hypertension, benign 04/24/2015   Allergic rhinitis due to pollen 04/24/2015    Retinal vein occlusion, branch 04/24/2015   Retinal vein occlusion 03/08/2015   Essential hypertension-new onset 03/08/2015   Hyperlipidemia with target LDL less than 130 09/09/2012    Health Maintenance Due  Topic Date Due   COVID-19 Vaccine (3 - Pf[employer] risk series) 12/17/2019    Health Habits Exercise: frequently Diet: well balanced   Depression Screen Depression screen [facility] 2/9 02/22/2021 01/11/2021 11/03/2020 10/26/2020 12/23/2019  Decreased Interest 0 0 0 0 0  Down, Depressed, Hopeless 0 0 0 0 0  PHQ - 2 Score 0 0 0 0 0  Altered sleeping 0 0 - - -  Tired, decreased energy 0 0 - - -  Change in appetite 0 0 - - -  Feeling bad or failure about yourself  0 0 - - -  Trouble concentrating 0 0 - - -  Moving slowly or fidgety/restless 0 0 - - -  Suicidal thoughts 0 0 - - -  PHQ-9 Score 0 0 - - -  Difficult doing work/chores Not difficult at all Not difficult at all - - -         Functional Ability Does the patient need help with: Ron Parker index)         Bathing: no         Dressing : no         Toileting: no         Transferring: no         Continence: no  Feeding: no           Safety Screen Does the home have:         Rugs in the hallway: no         Grab bars in the bathroom: no         Handrails on the stairs:yes, outside the home         Stairs in home: no         Poor lightning: no  Hearing Evaluation Do you have trouble hearing the television when others do not? no Do you have to strain to hear/understand conversations? no   Falls Risk: Does the patient need assistance with ambulation? no Does the patient have a history of a fall in the last 90 days? Yes, forgot there was a tilted sidewalk and had a fall. Reports she was ok and did not seek medical attention.   Is the patient at risk for falls? no Was the patient's timed "Get Up and Go Test" unsteady or longer than 30 seconds? No.   Advanced Care Planning Patient has executed an Advance Directive: no If  no, patient was given the opportunity to execute an Advance Directive today? yes This patient has the ability to prepare an Advance Directive: yes   Cognitive Assessment Does the patient have evidence of cognitive impairment? No The patient does not have evidence of a change in mood/affect, appearance, speech, memory or motor skills.   MMSE - Mini Mental State Exam 02/22/2021  Orientation to time 5  Orientation to Place 5  Registration 3  Attention/ Calculation 5  Recall 3  Language- name 2 objects 2  Language- repeat 1  Language- follow 3 step command 3  Language- read & follow direction 1  Write a sentence 1  Copy design 1  Total score 30     PHYSICAL EXAM: Vitals:   02/22/21 0902  BP: 125/79  Pulse: 67  Resp: 15  Temp: 98.1 F (36.7 C)  SpO2: 96%  Weight: 148 lb 12.8 oz (67.5 kg)  Height: 5' 5.98" (1.676 m)   Body mass index is 24.03 kg/m.   Vision screen performed. See vision activity in chart for documentation  General appearance - alert, well appearing, and in no distress, oriented to person, place, and time, and normal appearing weight Mental status - alert, oriented to person, place, and time, normal mood, behavior, speech, dress, motor activity, and thought processes Eyes - pupils equal and reactive, extraocular eye movements intact Ears - bilateral TM's and external ear canals normal Nose - normal and patent, no erythema, discharge or polyps Mouth - mucous membranes moist, pharynx normal without lesions Neck - supple, no significant adenopathy Lymphatics - no palpable lymphadenopathy, no hepatosplenomegaly Chest - clear to auscultation, no wheezes, rales or rhonchi, symmetric air entry, no tachypnea, retractions or cyanosis Heart - normal rate, regular rhythm, normal S1, S2, no murmurs, rubs, clicks or gallops Abdomen - soft, nontender, nondistended, no masses or organomegaly Breasts - patient declines to have breast exam Pelvic - exam declined by the  patient Back exam - full range of motion, no tenderness, palpable spasm or pain on motion Neurological - alert, oriented, normal speech, no focal findings or movement disorder noted, cranial nerves II through XII intact, funduscopic exam normal, discs flat and sharp, DTR's normal and symmetric, motor and sensory grossly normal bilaterally, normal muscle tone, no tremors, strength 5/5 Musculoskeletal - no joint tenderness, deformity or swelling Extremities - peripheral pulses normal, no pedal  edema, no clubbing or cyanosis Skin - LESIONS NOTED: suspicious lesion right lateral lower extremity, brown hyperpigmentation, papular, no evidence of drainage/erythema/tenderness. Patient reports she has an appointment with Dermatology soon.    Assessment: Welcome To Medicare Exam   Plan: During the course of the visit the patient was educated and counseled about appropriate screening and preventive services including:  1. Medicare annual wellness visit, subsequent: - Counseled on 150 minutes of exercise per week as tolerated, healthy eating (including decreased daily intake of saturated fats, cholesterol, added sugars, sodium), STI prevention, and routine healthcare maintenance.  2. Screening for metabolic disorder: - CMP last obtained 11/03/2020.   3. Screening for deficiency anemia: - CBC last obtained 11/03/2020.  4. Thyroid disorder screen: - TSH to check thyroid function.  - TSH  5. Essential hypertension: - Continue Amlodipine and Hydrochlorothiazide as prescribed.  - Counseled on blood pressure goal of less than 140/90, low-sodium, DASH diet, medication compliance, 150 minutes of moderate intensity exercise per week as tolerated. Discussed medication compliance, adverse effects. - Follow-up with primary provider in 3 months or sooner if needed.  - amLODipine (NORVASC) 10 MG tablet; Take 1 tablet (10 mg total) by mouth daily.  Dispense: 90 tablet; Refill: 0 - hydrochlorothiazide (HYDRODIURIL)  25 MG tablet; Take 1 tablet (25 mg total) by mouth daily.  Dispense: 90 tablet; Refill: 0  6. Ankle edema, bilateral: - Trial course of Furosemide to assist with bilateral ankle edema probable related to Amlodipine. - Follow-up with primary provider as scheduled.  - furosemide (LASIX) 20 MG tablet; Take 1 tablet (20 mg total) by mouth daily.  Dispense: 30 tablet; Refill: 0  7. Prediabetes: - Hemoglobin A1c to check level of prediabetes control.  - Hemoglobin A1c  8. Hyperlipidemia, unspecified hyperlipidemia type: -Practice low-fat heart healthy diet and at least 150 minutes of moderate intensity exercise weekly as tolerated.  - Continue Simvastatin as prescribed.  - Lipid panel to screen for high cholesterol.  - Follow-up with primary provider as scheduled.  - Lipid Panel - simvastatin (ZOCOR) 40 MG tablet; Take 1 tablet by mouth once daily with breakfast  Dispense: 90 tablet; Refill: 0  9. Skin lesion of right lower extremity: - Keep appointment with Dermatology.     The following orders were placed at today's visit; Orders Placed This Encounter  Procedures   Hemoglobin A1c   TSH   Lipid Panel     An after visit summary with all of these plans was given to the patient.

## 2021-02-22 ENCOUNTER — Ambulatory Visit (INDEPENDENT_AMBULATORY_CARE_PROVIDER_SITE_OTHER): Payer: PPO | Admitting: Family

## 2021-02-22 ENCOUNTER — Other Ambulatory Visit: Payer: Self-pay

## 2021-02-22 ENCOUNTER — Encounter: Payer: Self-pay | Admitting: Family

## 2021-02-22 VITALS — BP 125/79 | HR 67 | Temp 98.1°F | Resp 15 | Ht 65.98 in | Wt 148.8 lb

## 2021-02-22 DIAGNOSIS — Z13228 Encounter for screening for other metabolic disorders: Secondary | ICD-10-CM

## 2021-02-22 DIAGNOSIS — M25471 Effusion, right ankle: Secondary | ICD-10-CM | POA: Diagnosis not present

## 2021-02-22 DIAGNOSIS — Z1329 Encounter for screening for other suspected endocrine disorder: Secondary | ICD-10-CM | POA: Diagnosis not present

## 2021-02-22 DIAGNOSIS — M25472 Effusion, left ankle: Secondary | ICD-10-CM

## 2021-02-22 DIAGNOSIS — E785 Hyperlipidemia, unspecified: Secondary | ICD-10-CM | POA: Diagnosis not present

## 2021-02-22 DIAGNOSIS — I10 Essential (primary) hypertension: Secondary | ICD-10-CM

## 2021-02-22 DIAGNOSIS — Z130 Encounter for screening for diseases of the blood and blood-forming organs and certain disorders involving the immune mechanism: Secondary | ICD-10-CM

## 2021-02-22 DIAGNOSIS — Z0000 Encounter for general adult medical examination without abnormal findings: Secondary | ICD-10-CM | POA: Diagnosis not present

## 2021-02-22 DIAGNOSIS — L989 Disorder of the skin and subcutaneous tissue, unspecified: Secondary | ICD-10-CM | POA: Diagnosis not present

## 2021-02-22 DIAGNOSIS — R7303 Prediabetes: Secondary | ICD-10-CM | POA: Diagnosis not present

## 2021-02-22 MED ORDER — HYDROCHLOROTHIAZIDE 25 MG PO TABS: 25.0000 mg | ORAL_TABLET | Freq: Every day | ORAL | 0 refills | Status: DC

## 2021-02-22 MED ORDER — FUROSEMIDE 20 MG PO TABS: 20.0000 mg | ORAL_TABLET | Freq: Every day | ORAL | 0 refills | Status: DC

## 2021-02-22 MED ORDER — AMLODIPINE BESYLATE 10 MG PO TABS: 10.0000 mg | ORAL_TABLET | Freq: Every day | ORAL | 0 refills | Status: DC

## 2021-02-22 MED ORDER — SIMVASTATIN 40 MG PO TABS: ORAL_TABLET | ORAL | 0 refills | Status: DC

## 2021-02-22 NOTE — Progress Notes (Signed)
Pt presents for medicare well visit w/annual physical exam  Pt experiencing bilateral arm swelling in legs for about 2 weeks, pt stands on feet most of day, relief comes after elevation Mild shin pain in right leg not sure if its from melanoma spot

## 2021-02-23 ENCOUNTER — Encounter: Payer: Self-pay | Admitting: Family

## 2021-02-23 LAB — LIPID PANEL
Chol/HDL Ratio: 3.3 ratio (ref 0.0–4.4)
Cholesterol, Total: 192 mg/dL (ref 100–199)
HDL: 59 mg/dL (ref >39)
LDL Chol Calc (NIH): 97 mg/dL (ref 0–99)
Triglycerides: 211 mg/dL — ABNORMAL HIGH (ref 0–149)
VLDL Cholesterol Cal: 36 mg/dL (ref 5–40)

## 2021-02-23 LAB — TSH: TSH: 1.82 u[IU]/mL (ref 0.450–4.500)

## 2021-02-23 LAB — HEMOGLOBIN A1C
Est. average glucose Bld gHb Est-mCnc: 111 mg/dL
Hgb A1c MFr Bld: 5.5 % (ref 4.8–5.6)

## 2021-02-23 NOTE — Progress Notes (Signed)
Thyroid function normal.   Hemoglobin A1c improved since 3 months ago. Reminder to continue practicing healthy eating habits of fresh fruit and vegetables, lean baked meats such as chicken, fish, and [place]; limit breads, rice, pastas, and desserts; practice regular aerobic exercise (at least 150 minutes a week as tolerated). Encouraged to recheck in 6 months.   Cholesterol higher than expected. High cholesterol may increase risk of heart attack and/or stroke. Consider eating more fruits, vegetables, and lean baked meats such as chicken or fish. Moderate intensity exercise at least 150 minutes as tolerated per week may help as well.   Continue Simvastatin for high cholesterol. Encouraged to recheck in 3 to 6 months.   The following is for provider reference only:  The 10-year ASCVD risk score Mikey Bussing DC Brooke Bonito., et al., 2013) is: 9.4%   Values used to calculate the score:     Age: 28 years     Sex: Female     Is Non-Hispanic African American: No     Diabetic: No     Tobacco smoker: No     Systolic Blood Pressure: 500 mmHg     Is BP treated: Yes     HDL Cholesterol: 59 mg/dL     Total Cholesterol: 192 mg/dL

## 2021-03-08 ENCOUNTER — Telehealth: Payer: Self-pay | Admitting: Nurse Practitioner

## 2021-03-08 NOTE — Telephone Encounter (Signed)
VM was left AWV 

## 2021-03-09 DIAGNOSIS — L821 Other seborrheic keratosis: Secondary | ICD-10-CM | POA: Diagnosis not present

## 2021-03-09 DIAGNOSIS — L814 Other melanin hyperpigmentation: Secondary | ICD-10-CM | POA: Diagnosis not present

## 2021-03-09 DIAGNOSIS — L920 Granuloma annulare: Secondary | ICD-10-CM | POA: Diagnosis not present

## 2021-03-09 DIAGNOSIS — L820 Inflamed seborrheic keratosis: Secondary | ICD-10-CM | POA: Diagnosis not present

## 2021-04-03 ENCOUNTER — Ambulatory Visit (INDEPENDENT_AMBULATORY_CARE_PROVIDER_SITE_OTHER): Payer: PPO

## 2021-04-03 ENCOUNTER — Other Ambulatory Visit: Payer: Self-pay

## 2021-04-03 ENCOUNTER — Ambulatory Visit
Admission: RE | Admit: 2021-04-03 | Discharge: 2021-04-03 | Disposition: A | Discharge status: Home / Self Care | Payer: PPO | Source: Ambulatory Visit | Attending: Physician Assistant | Admitting: Physician Assistant

## 2021-04-03 ENCOUNTER — Encounter: Payer: Self-pay | Admitting: Family

## 2021-04-03 VITALS — BP 121/76 | HR 79 | Temp 98.0°F | Resp 20

## 2021-04-03 DIAGNOSIS — J209 Acute bronchitis, unspecified: Secondary | ICD-10-CM | POA: Diagnosis not present

## 2021-04-03 DIAGNOSIS — R059 Cough, unspecified: Secondary | ICD-10-CM | POA: Diagnosis not present

## 2021-04-03 DIAGNOSIS — Z1231 Encounter for screening mammogram for malignant neoplasm of breast: Secondary | ICD-10-CM | POA: Diagnosis not present

## 2021-04-03 MED ORDER — DOXYCYCLINE HYCLATE 100 MG PO CAPS: 100.0000 mg | ORAL_CAPSULE | Freq: Two times a day (BID) | ORAL | 0 refills | Status: DC

## 2021-04-03 MED ORDER — ALBUTEROL SULFATE HFA 108 (90 BASE) MCG/ACT IN AERS: 2.0000 | INHALATION_SPRAY | Freq: Four times a day (QID) | RESPIRATORY_TRACT | 2 refills | Status: DC | PRN

## 2021-04-03 NOTE — Discharge Instructions (Addendum)
Return if any problems.

## 2021-04-03 NOTE — ED Provider Notes (Signed)
[facility]    CSN: [id number] Arrival date & time: 04/03/21  1151      History   Chief Complaint Chief Complaint  Patient presents with   Appointment    1200   Cough    HPI Lori Cook is a 70 y.o. female.   Pt reports she has had a cough for 3 weeks.  Pt reports she has had similar in the past with reflux but no current reflux symptoms   The history is provided by the patient. No language interpreter was used.  Cough Cough characteristics:  Non-productive Sputum characteristics:  Nondescript Severity:  Moderate Onset quality:  Gradual Timing:  Constant Progression:  Worsening Chronicity:  New Relieved by:  Nothing Worsened by:  Nothing Ineffective treatments:  None tried Associated symptoms: no rhinorrhea and no wheezing    Past Medical History:  Diagnosis Date   Allergy    Arthritis    Blood transfusion without reported diagnosis    Cataract    Cholelithiasis    large stone detected by CT 01/2016   GERD (gastroesophageal reflux disease)    Hyperlipidemia    Hypertension    Nephrolithiasis    56m kidney stone L by CT 01/2016   Osteoporosis    Retinal vein occlusion, 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   [place]     Patient Active Problem List   Diagnosis Date Noted   Prediabetes 02/21/2021   Status post total replacement of right hip 06/15/2020   Primary osteoarthritis of right hip 06/14/2020   Vitamin D insufficiency 01/09/2020   Osteoporosis without current pathological fracture 01/07/2020   Hypercalcemia 01/07/2020   Status post total replacement of left hip 11/25/2019   Calculus of gallbladder without cholecystitis without obstruction 05/07/2016   Nephrolithiasis 05/07/2016   Pulmonary nodules 05/07/2016   Hematuria, microscopic 05/07/2016   Essential hypertension, benign 04/24/2015   Allergic rhinitis due to pollen 04/24/2015   Retinal vein occlusion, branch 04/24/2015   Retinal vein occlusion 03/08/2015   Essential  hypertension-new onset 03/08/2015   Hyperlipidemia with target LDL less than 130 09/09/2012    Past Surgical History:  Procedure Laterality Date   ABDOMINAL HYSTERECTOMY     APPENDECTOMY     CHOLECYSTECTOMY     COLONOSCOPY     [facility]- records purges and destroyed- per pt Normal    JOINT REPLACEMENT N/A    [employer] 01/08/2021   SPINE SURGERY     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   Procedure: LEFT TOTAL HIP ARTHROPLASTY ANTERIOR APPROACH;  Surgeon: XLeandrew Koyanagi MD;  [facility]  Service: Orthopedics;  Laterality: Left;   TOTAL HIP ARTHROPLASTY Right 06/15/2020   Procedure: RIGHT TOTAL HIP ARTHROPLASTY ANTERIOR APPROACH;  Surgeon: XLeandrew Koyanagi MD;  [place] [facility]  Service: Orthopedics;  Laterality: Right;    OB History   No obstetric history on file.      Home Medications    Prior to Admission medications   Medication Sig Start Date End Date Taking? Authorizing Provider  albuterol (VENTOLIN HFA) 108 (90 Base) MCG/ACT inhaler Inhale 2 puffs into the lungs every 6 (six) hours as needed for wheezing or shortness of breath. 04/03/21  Yes SCaryl AdaK, PA-C  doxycycline (VIBRAMYCIN) 100 MG capsule Take 1 capsule (100 mg total) by mouth 2 (two) times daily. 04/03/21  Yes SFransico Meadow PA-C  acetaminophen (TYLENOL) 500 MG tablet Take 1,000 mg by mouth every 6 (six) hours as needed  for moderate pain.     [provider]  alendronate (FOSAMAX) 70 MG tablet TAKE 1 TABLET (70 MG TOTAL) BY MOUTH ONCE A WEEK.  TAKE WITH A FULL GLASS OF WATER ON AN EMPTY STOMACH. 01/31/21   Shamleffer, Melanie Crazier, MD  amLODipine (NORVASC) 10 MG tablet Take 1 tablet (10 mg total) by mouth daily. 02/22/21 05/23/21  Camillia Herter, NP  aspirin EC 81 MG tablet Take 1 tablet (81 mg total) by mouth in the morning and at bedtime. 06/09/20   Aundra Dubin, PA-C  Cholecalciferol (VITAMIN D3 PO) Take by mouth.    [provider]  famotidine (PEPCID) 20 MG tablet Take 20  mg by mouth daily.     [provider]  fluticasone (FLONASE) 50 MCG/ACT nasal spray Place 2 sprays into both nostrils daily. 04/17/20   Tasia Catchings, Amy V, PA-C  furosemide (LASIX) 20 MG tablet Take 1 tablet (20 mg total) by mouth daily. 02/22/21   Camillia Herter, NP  hydrochlorothiazide (HYDRODIURIL) 25 MG tablet Take 1 tablet (25 mg total) by mouth daily. 02/22/21 05/23/21  Camillia Herter, NP  naproxen sodium (ALEVE) 220 MG tablet Take 440 mg by mouth 2 (two) times daily as needed (pain).     [provider]  simvastatin (ZOCOR) 40 MG tablet Take 1 tablet by mouth once daily with breakfast 02/22/21   Camillia Herter, NP    Family History Family History  Problem Relation Age of Onset   Stroke Father    Diabetes Father    Heart disease Father 65       AMI   Hypertension Sister    Arthritis Brother    Mental illness Mother    Heart disease Paternal Uncle    Colon cancer Neg Hx    Colon polyps Neg Hx    Esophageal cancer Neg Hx    Rectal cancer Neg Hx    Stomach cancer Neg Hx     Social History Social History   Tobacco Use   Smoking status: Former    Packs/day: 1.50    Years: 39.00    Pack years: 58.50    Types: Cigarettes    Start date: 87    Quit date: 09/08/2010    Years since quitting: 10.5   Smokeless tobacco: Never  Vaping Use   Vaping Use: Never used  Substance Use Topics   Alcohol use: No   Drug use: No     Allergies   Other, Tramadol, Codeine, and Oxycodone   Review of Systems Review of Systems  HENT:  Negative for rhinorrhea.   Respiratory:  Positive for cough. Negative for wheezing.   All other systems reviewed and are negative.   Physical Exam Triage Vital Signs ED Triage Vitals  Enc Vitals Group     BP 04/03/21 1229 121/76     Pulse Rate 04/03/21 1229 79     Resp 04/03/21 1229 20     Temp 04/03/21 1229 98 F (36.7 C)     Temp Source 04/03/21 1229 Oral     SpO2 04/03/21 1229 97 %     Weight --      Height --      Head  Circumference --      Peak Flow --      Pain Score 04/03/21 1230 2     Pain Loc --      Pain Edu? --      Excl. in [place]? --  No data found.  Updated Vital Signs BP 121/76 (BP Location: Left Arm)   Pulse 79   Temp 98 F (36.7 C) (Oral)   Resp 20   SpO2 97%   Visual Acuity Right Eye Distance:   Left Eye Distance:   Bilateral Distance:    Right Eye Near:   Left Eye Near:    Bilateral Near:     Physical Exam Vitals and nursing note reviewed.  Constitutional:      Appearance: She is well-developed.  HENT:     Head: Normocephalic.     Mouth/Throat:     Mouth: Mucous membranes are moist.  Cardiovascular:     Rate and Rhythm: Normal rate.  Pulmonary:     Effort: Pulmonary effort is normal.     Breath sounds: Normal breath sounds.  Abdominal:     General: Abdomen is flat. There is no distension.  Musculoskeletal:        General: Normal range of motion.     Cervical back: Normal range of motion.  Neurological:     Mental Status: She is alert and oriented to person, place, and time.     UC Treatments / Results  Labs (all labs ordered are listed, but only abnormal results are displayed) Labs Reviewed - No data to display  EKG   Radiology DG Chest 2 View  Result Date: 04/03/2021 CLINICAL DATA:  cough EXAM: CHEST - 2 VIEW COMPARISON:  June 11, 2020 FINDINGS: The cardiomediastinal silhouette is unchanged in contour. No pleural effusion. No pneumothorax. There is mild peribronchial cuffing. Visualized abdomen is unremarkable. No acute osseous abnormality. IMPRESSION: Mild peribronchial cuffing which can be seen in the setting of bronchitis. Electronically Signed   By: Valentino Saxon MD   On: 04/03/2021 13:25    Procedures Procedures (including critical care time)  Medications Ordered in UC Medications - No data to display  Initial Impression / Assessment and Plan / UC Course  I have reviewed the triage vital signs and the nursing notes.  Pertinent labs  & imaging results that were available during my care of the patient were reviewed by me and considered in my medical decision making (see chart for details).     MDM:  Chest xray shows bronchiits.  Pt given rx for albuterol and doxycycline.  Pt advised to recheck with her provider in 1 week  Final Clinical Impressions(s) / UC Diagnoses   Final diagnoses:  Acute bronchitis, unspecified organism     Discharge Instructions      Return if any problems.     ED Prescriptions     Medication Sig Dispense Auth. Provider   albuterol (VENTOLIN HFA) 108 (90 Base) MCG/ACT inhaler Inhale 2 puffs into the lungs every 6 (six) hours as needed for wheezing or shortness of breath. 8 g Rahim Astorga K, [place]   doxycycline (VIBRAMYCIN) 100 MG capsule Take 1 capsule (100 mg total) by mouth 2 (two) times daily. 20 capsule Fransico Meadow, [place]      PDMP not reviewed this encounter. An After Visit Summary was printed and given to the patient.    Fransico Meadow, [place] 04/03/21 1337

## 2021-04-03 NOTE — ED Triage Notes (Signed)
Pt sts dry cough x 3 weeks; pt sts some right ear pain; denies fever or other sx

## 2021-04-19 ENCOUNTER — Other Ambulatory Visit: Payer: Self-pay | Admitting: Internal Medicine

## 2021-04-19 IMAGING — RF DG C-ARM 1-60 MIN
1 series · 2 of 2 positions shown · non-contrast
Comparison: None.

CLINICAL DATA: Left hip arthroplasty.

EXAM:
OPERATIVE left HIP (WITH PELVIS IF PERFORMED) 2 VIEWS
TECHNIQUE: Fluoroscopic spot image(s) were submitted for interpretation
post-operatively.
Radiation exposure index: 2.3755 mGy.

[Series 1: unknown protocol · 0.20mm/px · 2 of 2 slices shown]
[im 1/2]
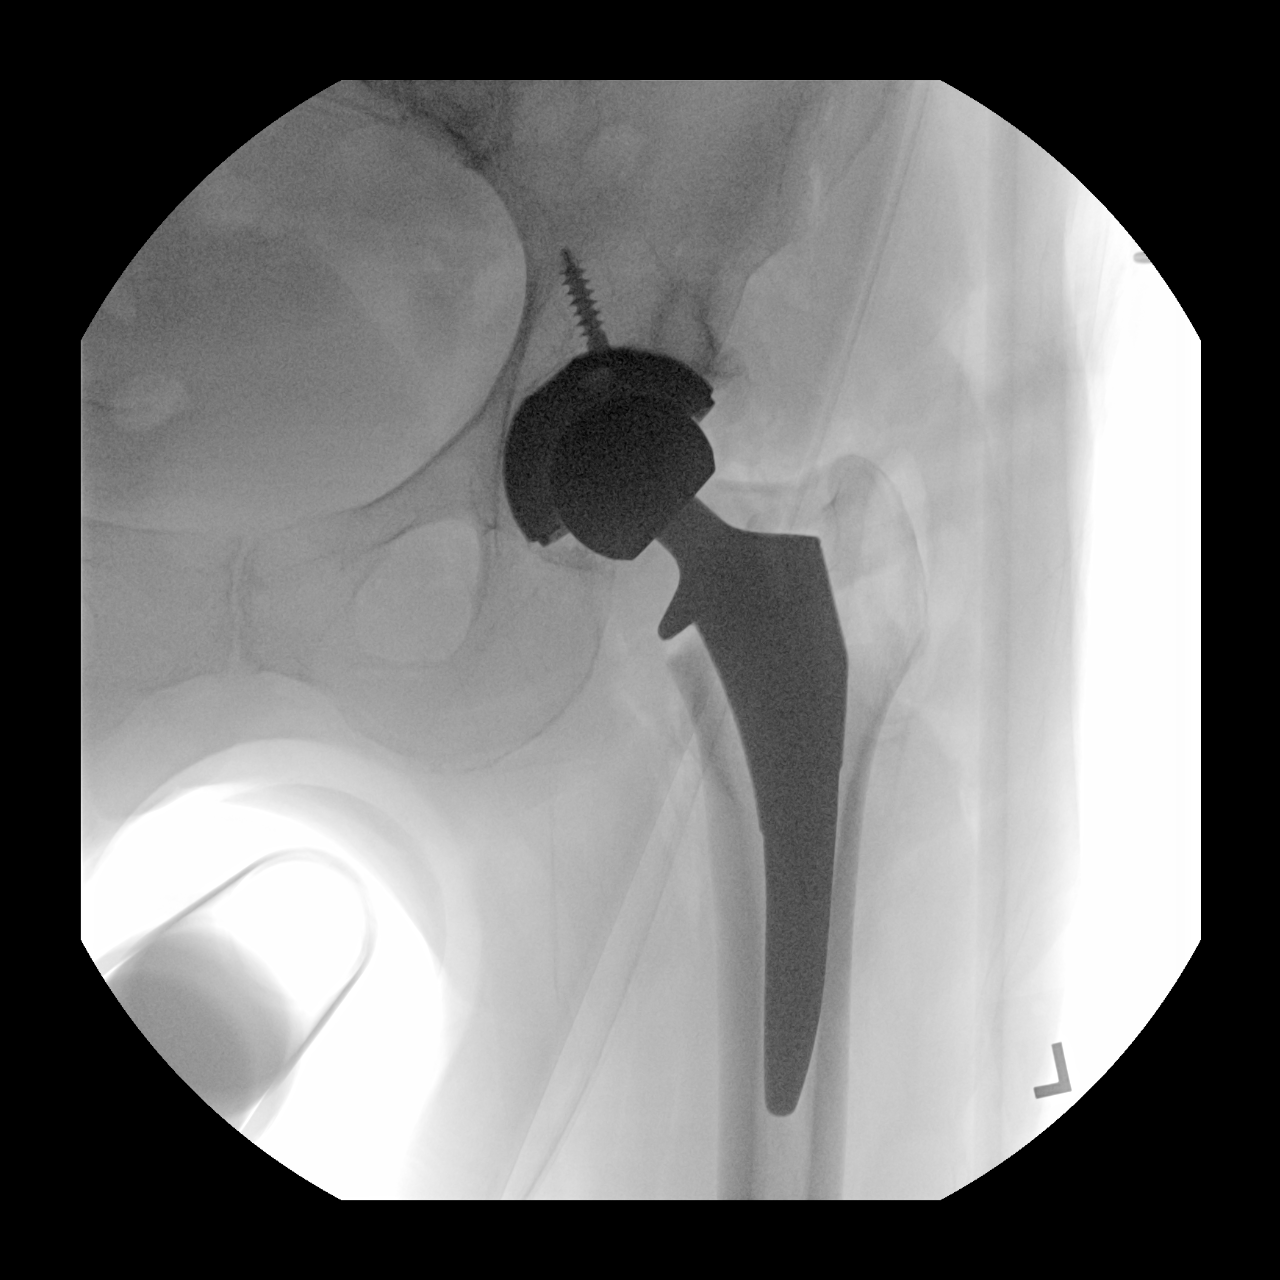
[im 2/2]
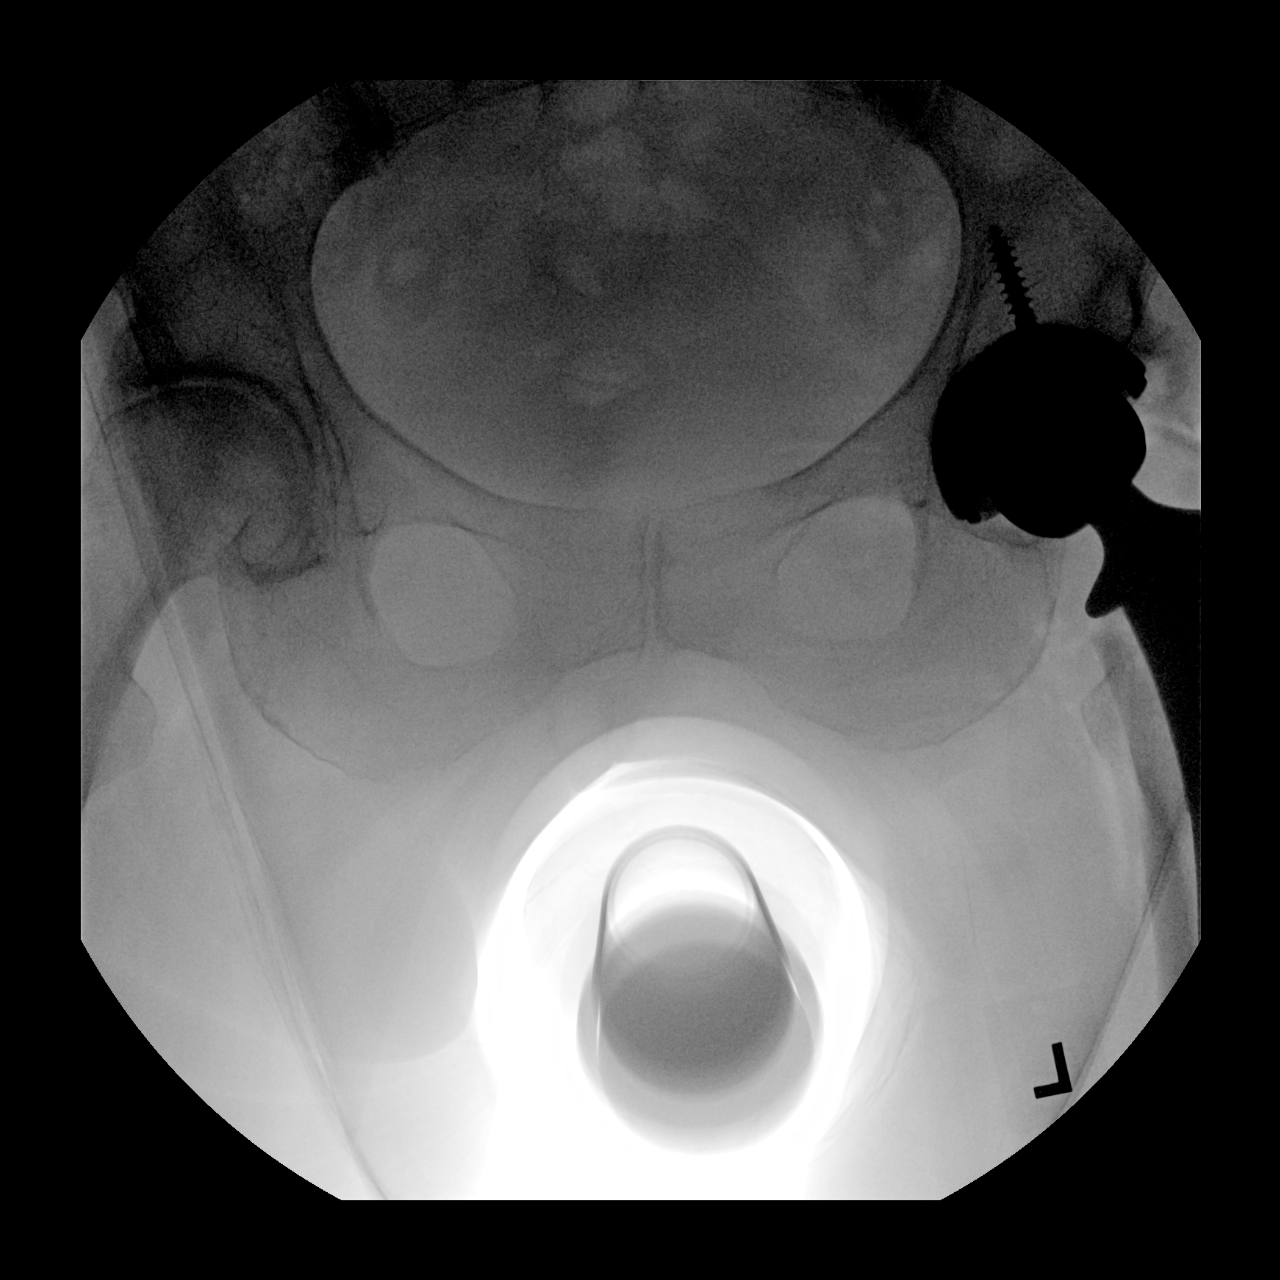

[2 of 2 positions shown; findings below may reference images not displayed]

FINDINGS: Two intraoperative fluoroscopic images of the left hip demonstrate
the left acetabular and femoral components to be well situated.
IMPRESSION: Status post left total hip arthroplasty.

## 2021-05-19 ENCOUNTER — Other Ambulatory Visit: Payer: Self-pay

## 2021-05-19 ENCOUNTER — Encounter: Payer: Self-pay | Admitting: Emergency Medicine

## 2021-05-19 ENCOUNTER — Ambulatory Visit: Payer: PPO

## 2021-05-19 ENCOUNTER — Ambulatory Visit
Admission: EM | Admit: 2021-05-19 | Discharge: 2021-05-19 | Disposition: A | Discharge status: Home / Self Care | Payer: PPO | Attending: Urgent Care | Admitting: Urgent Care

## 2021-05-19 DIAGNOSIS — S60211A Contusion of right wrist, initial encounter: Secondary | ICD-10-CM

## 2021-05-19 DIAGNOSIS — M25531 Pain in right wrist: Secondary | ICD-10-CM | POA: Diagnosis not present

## 2021-05-19 DIAGNOSIS — M79641 Pain in right hand: Secondary | ICD-10-CM | POA: Diagnosis not present

## 2021-05-19 DIAGNOSIS — S60229A Contusion of unspecified hand, initial encounter: Secondary | ICD-10-CM

## 2021-05-19 DIAGNOSIS — M19031 Primary osteoarthritis, right wrist: Secondary | ICD-10-CM | POA: Diagnosis not present

## 2021-05-19 MED ORDER — PREDNISONE 10 MG PO TABS: 30.0000 mg | ORAL_TABLET | Freq: Every day | ORAL | 0 refills | Status: DC

## 2021-05-19 NOTE — ED Triage Notes (Signed)
Pt sts trip and fall today with right wrist and hand pain after fall

## 2021-05-19 NOTE — ED Provider Notes (Signed)
[place]   MRN: [id number] DOB: [birth year]  Subjective:   Lori Cook is a 70 y.o. female presenting for suffering a fall today.  Patient was walking on the driveway and had an elevated uneven paved surface that she tripped on.  This caused her to fall making impact directly with her right hand and wrist while he was outstretched.  She made secondary impact with the chin.  Denies loss consciousness, confusion, laceration, bony deformity.  Denies history of fracture of the right wrist.  Has taken Tylenol, took 650 mg prior to coming into the clinic.  No current facility-administered medications for this encounter.  Current Outpatient Medications:    acetaminophen (TYLENOL) 500 MG tablet, Take 1,000 mg by mouth every 6 (six) hours as needed for moderate pain. , Disp: , Rfl:    albuterol (VENTOLIN HFA) 108 (90 Base) MCG/ACT inhaler, Inhale 2 puffs into the lungs every 6 (six) hours as needed for wheezing or shortness of breath., Disp: 8 g, Rfl: 2   alendronate (FOSAMAX) 70 MG tablet, TAKE 1 TABLET BY MOUTH ONCE A WEEK TAKE  WITH  A  FULL  GLASS  OF  WATER  ON  AN  EMPTY  STOMACH, Disp: 12 tablet, Rfl: 0   amLODipine (NORVASC) 10 MG tablet, Take 1 tablet (10 mg total) by mouth daily., Disp: 90 tablet, Rfl: 0   aspirin EC 81 MG tablet, Take 1 tablet (81 mg total) by mouth in the morning and at bedtime., Disp: 84 tablet, Rfl: 0   Cholecalciferol (VITAMIN D3 PO), Take by mouth., Disp: , Rfl:    doxycycline (VIBRAMYCIN) 100 MG capsule, Take 1 capsule (100 mg total) by mouth 2 (two) times daily. (Patient not taking: Reported on 05/19/2021), Disp: 20 capsule, Rfl: 0   famotidine (PEPCID) 20 MG tablet, Take 20 mg by mouth daily. , Disp: , Rfl:    fluticasone (FLONASE) 50 MCG/ACT nasal spray, Place 2 sprays into both nostrils daily., Disp: 1 g, Rfl: 0   furosemide (LASIX) 20 MG tablet, Take 1 tablet (20 mg total) by mouth daily., Disp: 30 tablet, Rfl: 0   hydrochlorothiazide  (HYDRODIURIL) 25 MG tablet, Take 1 tablet (25 mg total) by mouth daily., Disp: 90 tablet, Rfl: 0   naproxen sodium (ALEVE) 220 MG tablet, Take 440 mg by mouth 2 (two) times daily as needed (pain). , Disp: , Rfl:    simvastatin (ZOCOR) 40 MG tablet, Take 1 tablet by mouth once daily with breakfast, Disp: 90 tablet, Rfl: 0   Allergies  Allergen Reactions   Other    Tramadol Nausea And Vomiting   Codeine Other (See Comments)    Headache   Oxycodone Palpitations    Past Medical History:  Diagnosis Date   Allergy    Arthritis    Blood transfusion without reported diagnosis    Cataract    Cholelithiasis    large stone detected by CT 01/2016   GERD (gastroesophageal reflux disease)    Hyperlipidemia    Hypertension    Nephrolithiasis    19m kidney stone L by CT 01/2016   Osteoporosis    Retinal vein occlusion, 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   [place]    in [place] only      Past Surgical History:  Procedure Laterality Date   ABDOMINAL HYSTERECTOMY     APPENDECTOMY     CHOLECYSTECTOMY     COLONOSCOPY     [facility]- records purges and destroyed- per pt Normal  JOINT REPLACEMENT N/A    [employer] 01/08/2021   SPINE SURGERY     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   Procedure: LEFT TOTAL HIP ARTHROPLASTY ANTERIOR APPROACH;  Surgeon: Leandrew Koyanagi, MD;  Location: [facility];  Service: Orthopedics;  Laterality: Left;   TOTAL HIP ARTHROPLASTY Right 06/15/2020   Procedure: RIGHT TOTAL HIP ARTHROPLASTY ANTERIOR APPROACH;  Surgeon: Leandrew Koyanagi, MD;  Location: [place];  Service: Orthopedics;  Laterality: Right;    Family History  Problem Relation Age of Onset   Stroke Father    Diabetes Father    Heart disease Father 48       AMI   Hypertension Sister    Arthritis Brother    Mental illness Mother    Heart disease Paternal Uncle    Colon cancer Neg Hx    Colon polyps Neg Hx    Esophageal cancer Neg Hx    Rectal cancer Neg Hx    Stomach cancer Neg Hx     Social History   Tobacco  Use   Smoking status: Former    Packs/day: 1.50    Years: 39.00    Pack years: 58.50    Types: Cigarettes    Start date: 53    Quit date: 09/08/2010    Years since quitting: 10.7   Smokeless tobacco: Never  Vaping Use   Vaping Use: Never used  Substance Use Topics   Alcohol use: No   Drug use: No    ROS   Objective:   Vitals: BP (!) 149/76 (BP Location: Left Arm)   Pulse 68   Temp 97.8 F (36.6 C) (Oral)   Resp 18   SpO2 99%   Physical Exam Constitutional:      General: She is not in acute distress.    Appearance: Normal appearance. She is well-developed. She is not ill-appearing, toxic-appearing or diaphoretic.  HENT:     Head: Normocephalic and atraumatic.     Nose: Nose normal.     Mouth/Throat:     Mouth: Mucous membranes are moist.     Pharynx: Oropharynx is clear.  Eyes:     General: No scleral icterus.       Right eye: No discharge.        Left eye: No discharge.     Extraocular Movements: Extraocular movements intact.     Conjunctiva/sclera: Conjunctivae normal.     Pupils: Pupils are equal, round, and reactive to light.  Cardiovascular:     Rate and Rhythm: Normal rate.  Pulmonary:     Effort: Pulmonary effort is normal.  Musculoskeletal:     Right wrist: Swelling (dorsal, ulnar aspect), tenderness and bony tenderness present. No deformity, effusion, lacerations, snuff box tenderness or crepitus. Decreased range of motion.     Right hand: No swelling, deformity, lacerations, tenderness or bony tenderness. Normal range of motion. Normal strength. Normal capillary refill.     Comments: She has resolving ecchymosis over the dorsal aspect of her right hand from a previous injury.  Skin:    General: Skin is warm and dry.  Neurological:     General: No focal deficit present.     Mental Status: She is alert and oriented to person, place, and time.     Cranial Nerves: No cranial nerve deficit.     Motor: No weakness.     Coordination: Coordination  normal.     Gait: Gait normal.     Deep Tendon Reflexes: Reflexes normal.  Psychiatric:  Mood and Affect: Mood normal.        Behavior: Behavior normal.        Thought Content: Thought content normal.        Judgment: Judgment normal.    DG Wrist Complete Right  Result Date: 05/19/2021 CLINICAL DATA:  Fall yesterday with wrist and hand pain. EXAM: RIGHT WRIST - COMPLETE 3+ VIEW COMPARISON:  02/19/2015 MRI. FINDINGS: Mild osteopenia. Calcification within the triangular fibrocartilage. Remote ulnar styloid fracture. Degenerative changes involving the radiocarpal articulation and radial aspect of the carpal bones, as well as the base of the thumb, moderate. Scaphoid intact. IMPRESSION: Degenerative change, without acute osseous finding. Calcification in the triangular fibrocartilage, consistent with calcium pyrophosphate deposition disease. Electronically Signed   By: Abigail Miyamoto M.D.   On: 05/19/2021 14:58     Assessment and Plan :   PDMP not reviewed this encounter.  1. Contusion of right wrist, initial encounter   2. Right hand pain   3. Traumatic ecchymosis of hand, initial encounter   4. Right wrist pain     Recommended conservative management for right wrist/hand contusion.  Use RICE method, APAP for pain and inflammation. In light of her arthritis, recommended prednisone as well. A 2" Ace wrap was applied to the right wrist. Counseled patient on potential for adverse effects with medications prescribed/recommended today, ER and return-to-clinic precautions discussed, patient verbalized understanding.    Jaynee Eagles, PA-C 05/19/21 1504

## 2021-05-26 ENCOUNTER — Ambulatory Visit: Payer: PPO | Admitting: Family

## 2021-05-27 ENCOUNTER — Other Ambulatory Visit: Payer: Self-pay

## 2021-05-27 NOTE — Progress Notes (Signed)
Patient ID: Lori Cook, female    DOB: Feb 10, 1951  MRN: [id number]  CC: Hypertension Follow-Up  Subjective: Lori Cook is a 70 y.o. female who presents for hypertension follow-up.   Her concerns today include:   HYPERTENSION FOLLOW-UP: 02/22/2021: - Continue Amlodipine and Hydrochlorothiazide as prescribed.   05/31/2021: Doing well on current regimen. No side effects. No issues/concerns. Reports since last visit she did not take Furosemide as bilateral ankle edema resolved.  2. HYPERLIPIDEMIA FOLLOW-UP: Requesting refills of Simvastatin.  3. RIGHT SHIN PAIN: Reports ongoing for some time. Intermittent in nature. Reports seemed more after recent procedure at Dermatology. Reports worsening throughout day and improving during nighttime with rest. Denies red flag symptoms such as but not limited to swelling, tenderness, and redness.    Patient Active Problem List   Diagnosis Date Noted   Prediabetes 02/21/2021   Status post total replacement of right hip 06/15/2020   Primary osteoarthritis of right hip 06/14/2020   Vitamin D insufficiency 01/09/2020   Osteoporosis without current pathological fracture 01/07/2020   Hypercalcemia 01/07/2020   Status post total replacement of left hip 11/25/2019   Calculus of gallbladder without cholecystitis without obstruction 05/07/2016   Nephrolithiasis 05/07/2016   Pulmonary nodules 05/07/2016   Hematuria, microscopic 05/07/2016   Essential hypertension, benign 04/24/2015   Allergic rhinitis due to pollen 04/24/2015   Retinal vein occlusion, branch 04/24/2015   Retinal vein occlusion 03/08/2015   Essential hypertension-new onset 03/08/2015   Hyperlipidemia with target LDL less than 130 09/09/2012     Current Outpatient Medications on File Prior to Visit  Medication Sig Dispense Refill   acetaminophen (TYLENOL) 500 MG tablet Take 1,000 mg by mouth every 6 (six) hours as needed for moderate pain.      albuterol (VENTOLIN HFA) 108  (90 Base) MCG/ACT inhaler Inhale 2 puffs into the lungs every 6 (six) hours as needed for wheezing or shortness of breath. 8 g 2   alendronate (FOSAMAX) 70 MG tablet TAKE 1 TABLET BY MOUTH ONCE A WEEK TAKE  WITH  A  FULL  GLASS  OF  WATER  ON  AN  EMPTY  STOMACH 12 tablet 0   aspirin EC 81 MG tablet Take 1 tablet (81 mg total) by mouth in the morning and at bedtime. 84 tablet 0   Cholecalciferol (VITAMIN D3 PO) Take by mouth.     doxycycline (VIBRAMYCIN) 100 MG capsule Take 1 capsule (100 mg total) by mouth 2 (two) times daily. (Patient not taking: Reported on 05/19/2021) 20 capsule 0   famotidine (PEPCID) 20 MG tablet Take 20 mg by mouth daily.      fluticasone (FLONASE) 50 MCG/ACT nasal spray Place 2 sprays into both nostrils daily. 1 g 0   furosemide (LASIX) 20 MG tablet Take 1 tablet (20 mg total) by mouth daily. 30 tablet 0   naproxen sodium (ALEVE) 220 MG tablet Take 440 mg by mouth 2 (two) times daily as needed (pain).      predniSONE (DELTASONE) 10 MG tablet Take 3 tablets (30 mg total) by mouth daily with breakfast. 15 tablet 0   No current facility-administered medications on file prior to visit.    Allergies  Allergen Reactions   Other    Tramadol Nausea And Vomiting   Codeine Other (See Comments)    Headache   Oxycodone Palpitations    Social History   Socioeconomic History   Marital status: Single    Spouse name: Not on file   Number  of children: Not on file   Years of education: Not on file   Highest education level: Not on file  Occupational History   Not on file  Tobacco Use   Smoking status: Former    Packs/day: 1.50    Years: 39.00    Pack years: 58.50    Types: Cigarettes    Start date: 30    Quit date: 09/08/2010    Years since quitting: 10.7   Smokeless tobacco: Never  Vaping Use   Vaping Use: Never used  Substance and Sexual Activity   Alcohol use: No   Drug use: No   Sexual activity: Not on file  Other Topics Concern   Not on file  Social  History Narrative   Marital status: single; not dating; not interested in 2018     Children: 2 daughters, 1 son passed away 23.51 years old.  Two grandchildren (20,8)      Lives: with daughter, grandson.      Employment:  Education administrator business full time; one employee      Tobacco: quit smoking      Alcohol:  None      Exercise: no formal exercise      ADLs: independent with ADLs; drives; no assistant devices.       Advanced Directives: none in 2018.  FULL CODE; no prolonged measures. HCPOA: daughters   Social Determinants of Health   Financial Resource Strain: Not on file  Food Insecurity: Not on file  Transportation Needs: Not on file  Physical Activity: Not on file  Stress: Not on file  Social Connections: Not on file  Intimate Partner Violence: Not on file    Family History  Problem Relation Age of Onset   Stroke Father    Diabetes Father    Heart disease Father 74       AMI   Hypertension Sister    Arthritis Brother    Mental illness Mother    Heart disease Paternal Uncle    Colon cancer Neg Hx    Colon polyps Neg Hx    Esophageal cancer Neg Hx    Rectal cancer Neg Hx    Stomach cancer Neg Hx     Past Surgical History:  Procedure Laterality Date   ABDOMINAL HYSTERECTOMY     APPENDECTOMY     CHOLECYSTECTOMY     COLONOSCOPY     [facility]- records purges and destroyed- per pt Normal    JOINT REPLACEMENT N/A    [employer] 01/08/2021   SPINE SURGERY     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   Procedure: LEFT TOTAL HIP ARTHROPLASTY ANTERIOR APPROACH;  Surgeon: Leandrew Koyanagi, MD;  Location: [place];  Service: Orthopedics;  Laterality: Left;   TOTAL HIP ARTHROPLASTY Right 06/15/2020   Procedure: RIGHT TOTAL HIP ARTHROPLASTY ANTERIOR APPROACH;  Surgeon: Leandrew Koyanagi, MD;  Location: [place];  Service: Orthopedics;  Laterality: Right;    ROS: Review of Systems Negative except as stated above  PHYSICAL EXAM: BP 133/84   Pulse 75   Temp 97.9 F (36.6 C)   Resp 18    Ht 5' 5.98" (1.676 m)   Wt 148 lb 9.6 oz (67.4 kg)   SpO2 96%   BMI 24.00 kg/m   Physical Exam HENT:     Head: Normocephalic and atraumatic.  Eyes:     Extraocular Movements: Extraocular movements intact.     Conjunctiva/sclera: Conjunctivae normal.     Pupils: Pupils are equal, round, and  reactive to light.  Cardiovascular:     Rate and Rhythm: Normal rate and regular rhythm.     Pulses: Normal pulses.     Heart sounds: Normal heart sounds.  Pulmonary:     Effort: Pulmonary effort is normal.     Breath sounds: Normal breath sounds.  Musculoskeletal:     Cervical back: Normal range of motion and neck supple.     Comments: Right lower extremity without evidence of edema, tenderness, or erythema with palpation.   Neurological:     General: No focal deficit present.     Mental Status: She is alert and oriented to person, place, and time.  Psychiatric:        Mood and Affect: Mood normal.        Behavior: Behavior normal.    ASSESSMENT AND PLAN: 1. Essential hypertension: - Continue Amlodipine and Hydrochlorothiazide as prescribed.  - Counseled on blood pressure goal of less than 140/90, low-sodium, DASH diet, medication compliance, 150 minutes of moderate intensity exercise per week as tolerated. Discussed medication compliance, adverse effects. - CMP14+EGFR to check kidney function, liver function, and electrolyte balance.  - Follow-up with primary provider in 3 months or sooner if needed.  - CMP14+EGFR - amLODipine (NORVASC) 10 MG tablet; Take 1 tablet (10 mg total) by mouth daily.  Dispense: 90 tablet; Refill: 0 - hydrochlorothiazide (HYDRODIURIL) 25 MG tablet; Take 1 tablet (25 mg total) by mouth daily.  Dispense: 90 tablet; Refill: 0  2. Hyperlipidemia, unspecified hyperlipidemia type: -Practice low-fat heart healthy diet and at least 150 minutes of moderate intensity exercise weekly as tolerated.  - Continue Simvastatin as prescribed.  - Follow-up with primary  provider as scheduled.  - simvastatin (ZOCOR) 40 MG tablet; Take 1 tablet by mouth once daily with breakfast  Dispense: 90 tablet; Refill: 0  3. Pain in shin, right: - No evidence of red flag symptoms.  - Patient declined diagnostic x-ray. Prefers to watch and wait. Agrees to update primary provider should symptoms worsen and/or become severe.     Patient was given the opportunity to ask questions.  Patient verbalized understanding of the plan and was able to repeat key elements of the plan. Patient was given clear instructions to go to Emergency Department or return to medical center if symptoms don't improve, worsen, or new problems develop.The patient verbalized understanding.   Orders Placed This Encounter  Procedures   CMP14+EGFR     Requested Prescriptions   Signed Prescriptions Disp Refills   amLODipine (NORVASC) 10 MG tablet 90 tablet 0    Sig: Take 1 tablet (10 mg total) by mouth daily.   hydrochlorothiazide (HYDRODIURIL) 25 MG tablet 90 tablet 0    Sig: Take 1 tablet (25 mg total) by mouth daily.   simvastatin (ZOCOR) 40 MG tablet 90 tablet 0    Sig: Take 1 tablet by mouth once daily with breakfast    Return in about 3 months (around 08/30/2021) for Follow-Up or next available hypertension .  Camillia Herter, NP

## 2021-05-31 ENCOUNTER — Ambulatory Visit (INDEPENDENT_AMBULATORY_CARE_PROVIDER_SITE_OTHER): Payer: PPO | Admitting: Family

## 2021-05-31 ENCOUNTER — Encounter: Payer: Self-pay | Admitting: Family

## 2021-05-31 ENCOUNTER — Other Ambulatory Visit: Payer: Self-pay

## 2021-05-31 VITALS — BP 133/84 | HR 75 | Temp 97.9°F | Resp 18 | Ht 65.98 in | Wt 148.6 lb

## 2021-05-31 DIAGNOSIS — E785 Hyperlipidemia, unspecified: Secondary | ICD-10-CM | POA: Diagnosis not present

## 2021-05-31 DIAGNOSIS — M79661 Pain in right lower leg: Secondary | ICD-10-CM | POA: Diagnosis not present

## 2021-05-31 DIAGNOSIS — I10 Essential (primary) hypertension: Secondary | ICD-10-CM | POA: Diagnosis not present

## 2021-05-31 MED ORDER — HYDROCHLOROTHIAZIDE 25 MG PO TABS: 25.0000 mg | ORAL_TABLET | Freq: Every day | ORAL | 0 refills | Status: DC

## 2021-05-31 MED ORDER — SIMVASTATIN 40 MG PO TABS: ORAL_TABLET | ORAL | 0 refills | Status: DC

## 2021-05-31 MED ORDER — AMLODIPINE BESYLATE 10 MG PO TABS: 10.0000 mg | ORAL_TABLET | Freq: Every day | ORAL | 0 refills | Status: DC

## 2021-05-31 NOTE — Progress Notes (Signed)
Pt presents for hypertension follow-up, pt reports that mild pain present in right shin

## 2021-06-01 LAB — CMP14+EGFR
ALT: 17 IU/L (ref 0–32)
AST: 18 IU/L (ref 0–40)
Albumin/Globulin Ratio: 2 (ref 1.2–2.2)
Albumin: 4.4 g/dL (ref 3.8–4.8)
Alkaline Phosphatase: 80 IU/L (ref 44–121)
BUN/Creatinine Ratio: 20 (ref 12–28)
BUN: 19 mg/dL (ref 8–27)
Bilirubin Total: 0.4 mg/dL (ref 0.0–1.2)
CO2: 22 mmol/L (ref 20–29)
Calcium: 10.2 mg/dL (ref 8.7–10.3)
Chloride: 99 mmol/L (ref 96–106)
Creatinine, Ser: 0.94 mg/dL (ref 0.57–1.00)
Globulin, Total: 2.2 g/dL (ref 1.5–4.5)
Glucose: 89 mg/dL (ref 65–99)
Potassium: 3.5 mmol/L (ref 3.5–5.2)
Sodium: 138 mmol/L (ref 134–144)
Total Protein: 6.6 g/dL (ref 6.0–8.5)
eGFR: 66 mL/min/{1.73_m2} (ref >59)

## 2021-06-01 NOTE — Progress Notes (Signed)
Kidney function normal.   Liver function normal.

## 2021-06-12 ENCOUNTER — Ambulatory Visit (INDEPENDENT_AMBULATORY_CARE_PROVIDER_SITE_OTHER): Payer: PPO

## 2021-06-12 ENCOUNTER — Ambulatory Visit
Admission: EM | Admit: 2021-06-12 | Discharge: 2021-06-12 | Disposition: A | Discharge status: Home / Self Care | Payer: PPO | Attending: Urgent Care | Admitting: Urgent Care

## 2021-06-12 ENCOUNTER — Other Ambulatory Visit: Payer: Self-pay

## 2021-06-12 DIAGNOSIS — R053 Chronic cough: Secondary | ICD-10-CM

## 2021-06-12 DIAGNOSIS — R059 Cough, unspecified: Secondary | ICD-10-CM | POA: Diagnosis not present

## 2021-06-12 DIAGNOSIS — J3089 Other allergic rhinitis: Secondary | ICD-10-CM

## 2021-06-12 DIAGNOSIS — K219 Gastro-esophageal reflux disease without esophagitis: Secondary | ICD-10-CM

## 2021-06-12 MED ORDER — CETIRIZINE HCL 10 MG PO TABS: 10.0000 mg | ORAL_TABLET | Freq: Every day | ORAL | 0 refills | Status: DC

## 2021-06-12 MED ORDER — FAMOTIDINE 20 MG PO TABS: 20.0000 mg | ORAL_TABLET | Freq: Two times a day (BID) | ORAL | 0 refills | Status: DC

## 2021-06-12 NOTE — ED Provider Notes (Signed)
[place]   MRN: [id number] DOB: [birth year]  Subjective:   Lori Cook is a 70 y.o. female presenting for 83-month history of persistent intermittent cough.  Denies fever, runny or stuffy nose, throat pain, sinus pain, ear pain, chest pain, shortness of breath, wheezing.  She was seen toward the end of July, had a chest x-ray that showed bronchitis.  She was prescribed doxycycline, and albuterol inhaler.  She completed this course but never really tried the albuterol inhaler much.  She does have a history of allergic rhinitis and acid reflux.  She has not taken medications for either.  Reports that years ago she did have a really bad cough that was associated with her acid reflux.  She was seen about 3 weeks ago for a wrist injury and underwent a steroid course as we had to avoid use of NSAIDs.  It did not make much of a difference in her cough however.  No current facility-administered medications for this encounter.  Current Outpatient Medications:    acetaminophen (TYLENOL) 500 MG tablet, Take 1,000 mg by mouth every 6 (six) hours as needed for moderate pain. , Disp: , Rfl:    albuterol (VENTOLIN HFA) 108 (90 Base) MCG/ACT inhaler, Inhale 2 puffs into the lungs every 6 (six) hours as needed for wheezing or shortness of breath., Disp: 8 g, Rfl: 2   alendronate (FOSAMAX) 70 MG tablet, TAKE 1 TABLET BY MOUTH ONCE A WEEK TAKE  WITH  A  FULL  GLASS  OF  WATER  ON  AN  EMPTY  STOMACH, Disp: 12 tablet, Rfl: 0   amLODipine (NORVASC) 10 MG tablet, Take 1 tablet (10 mg total) by mouth daily., Disp: 90 tablet, Rfl: 0   aspirin EC 81 MG tablet, Take 1 tablet (81 mg total) by mouth in the morning and at bedtime., Disp: 84 tablet, Rfl: 0   Cholecalciferol (VITAMIN D3 PO), Take by mouth., Disp: , Rfl:    doxycycline (VIBRAMYCIN) 100 MG capsule, Take 1 capsule (100 mg total) by mouth 2 (two) times daily. (Patient not taking: Reported on 05/19/2021), Disp: 20 capsule, Rfl: 0   famotidine  (PEPCID) 20 MG tablet, Take 20 mg by mouth daily. , Disp: , Rfl:    fluticasone (FLONASE) 50 MCG/ACT nasal spray, Place 2 sprays into both nostrils daily., Disp: 1 g, Rfl: 0   furosemide (LASIX) 20 MG tablet, Take 1 tablet (20 mg total) by mouth daily., Disp: 30 tablet, Rfl: 0   hydrochlorothiazide (HYDRODIURIL) 25 MG tablet, Take 1 tablet (25 mg total) by mouth daily., Disp: 90 tablet, Rfl: 0   naproxen sodium (ALEVE) 220 MG tablet, Take 440 mg by mouth 2 (two) times daily as needed (pain). , Disp: , Rfl:    predniSONE (DELTASONE) 10 MG tablet, Take 3 tablets (30 mg total) by mouth daily with breakfast., Disp: 15 tablet, Rfl: 0   simvastatin (ZOCOR) 40 MG tablet, Take 1 tablet by mouth once daily with breakfast, Disp: 90 tablet, Rfl: 0   Allergies  Allergen Reactions   Other    Tramadol Nausea And Vomiting   Codeine Other (See Comments)    Headache   Oxycodone Palpitations    Past Medical History:  Diagnosis Date   Allergy    Arthritis    Blood transfusion without reported diagnosis    Cataract    Cholelithiasis    large stone detected by CT 01/2016   GERD (gastroesophageal reflux disease)    Hyperlipidemia    Hypertension  Nephrolithiasis    35mm kidney stone L by CT 01/2016   Osteoporosis    Retinal vein occlusion, 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      Past Surgical History:  Procedure Laterality Date   ABDOMINAL HYSTERECTOMY     APPENDECTOMY     CHOLECYSTECTOMY     COLONOSCOPY     [facility]- records purges and destroyed- per pt Normal    JOINT REPLACEMENT N/A    [employer] 01/08/2021   SPINE SURGERY     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   Procedure: LEFT TOTAL HIP ARTHROPLASTY ANTERIOR APPROACH;  Surgeon: Leandrew Koyanagi, MD;  Location: [place];  Service: Orthopedics;  Laterality: Left;   TOTAL HIP ARTHROPLASTY Right 06/15/2020   Procedure: RIGHT TOTAL HIP ARTHROPLASTY ANTERIOR APPROACH;  Surgeon: Leandrew Koyanagi, MD;  Location: [place];  Service:  Orthopedics;  Laterality: Right;    Family History  Problem Relation Age of Onset   Stroke Father    Diabetes Father    Heart disease Father 71       AMI   Hypertension Sister    Arthritis Brother    Mental illness Mother    Heart disease Paternal Uncle    Colon cancer Neg Hx    Colon polyps Neg Hx    Esophageal cancer Neg Hx    Rectal cancer Neg Hx    Stomach cancer Neg Hx     Social History   Tobacco Use   Smoking status: Former    Packs/day: 1.50    Years: 39.00    Pack years: 58.50    Types: Cigarettes    Start date: 67    Quit date: 09/08/2010    Years since quitting: 10.7   Smokeless tobacco: Never  Vaping Use   Vaping Use: Never used  Substance Use Topics   Alcohol use: No   Drug use: No    ROS   Objective:   Vitals: BP 130/77 (BP Location: Left Arm)   Pulse 71   Temp 98.1 F (36.7 C) (Oral)   Resp 18   SpO2 96%   Physical Exam Constitutional:      General: She is not in acute distress.    Appearance: Normal appearance. She is well-developed. She is not ill-appearing, toxic-appearing or diaphoretic.  HENT:     Head: Normocephalic and atraumatic.     Right Ear: Tympanic membrane, ear canal and external ear normal. No drainage or tenderness. No middle ear effusion. Tympanic membrane is not erythematous.     Left Ear: Tympanic membrane, ear canal and external ear normal. No drainage or tenderness.  No middle ear effusion. Tympanic membrane is not erythematous.     Nose: Nose normal. No congestion or rhinorrhea.     Comments: Nasal mucosa boggy and edematous.    Mouth/Throat:     Mouth: Mucous membranes are moist. No oral lesions.     Pharynx: No pharyngeal swelling, oropharyngeal exudate, posterior oropharyngeal erythema or uvula swelling.     Tonsils: No tonsillar exudate or tonsillar abscesses.     Comments: Slight drainage overlying pharynx. Eyes:     Extraocular Movements: Extraocular movements intact.     Right eye: Normal extraocular  motion.     Left eye: Normal extraocular motion.     Conjunctiva/sclera: Conjunctivae normal.     Pupils: Pupils are equal, round, and reactive to light.  Cardiovascular:     Rate and Rhythm: Normal rate  and regular rhythm.     Pulses: Normal pulses.     Heart sounds: Normal heart sounds. No murmur heard.   No friction rub. No gallop.  Pulmonary:     Effort: Pulmonary effort is normal. No respiratory distress.     Breath sounds: Normal breath sounds. No stridor. No wheezing, rhonchi or rales.  Musculoskeletal:     Cervical back: Normal range of motion and neck supple.  Lymphadenopathy:     Cervical: No cervical adenopathy.  Skin:    General: Skin is warm and dry.     Findings: No rash.  Neurological:     General: No focal deficit present.     Mental Status: She is alert and oriented to person, place, and time.  Psychiatric:        Mood and Affect: Mood normal.        Behavior: Behavior normal.        Thought Content: Thought content normal.    DG Chest 2 View  Result Date: 06/12/2021 CLINICAL DATA:  Persistent cough for over 1 month EXAM: CHEST - 2 VIEW COMPARISON:  04/03/2021 FINDINGS: Normal heart size and mediastinal contours. No acute infiltrate or edema. No effusion or pneumothorax. No acute osseous findings. IMPRESSION: No evidence of active disease. Electronically Signed   By: Jorje Guild M.D.   On: 06/12/2021 09:00     Assessment and Plan :   PDMP not reviewed this encounter.  1. Persistent cough   2. Allergic rhinitis due to other allergic trigger, unspecified seasonality   3. Gastroesophageal reflux disease, unspecified whether esophagitis present     Negative chest x-ray, hemodynamically stable vital signs.  Recommended addressing her persistent cough from 2 angles including allergic rhinitis and acid reflux.  Patient is to restart her Flonase, Zyrtec and also start famotidine.  We will avoid steroid use as she just took this for another reason 2 weeks ago.   Counseled patient on potential for adverse effects with medications prescribed/recommended today, ER and return-to-clinic precautions discussed, patient verbalized understanding.    Jaynee Eagles, [place] 06/12/21 [phone]

## 2021-06-12 NOTE — ED Triage Notes (Signed)
Over one month h/o cough. Pt was seen on 7/23 for a cough and dx with bronchitis. Since then Pt notes that the cough lessened but has gradually returned in the last 3 weeks. No meds taken. No other uri sxs. Pt questions is cough stems from GERD.

## 2021-06-22 ENCOUNTER — Encounter: Payer: Self-pay | Admitting: Orthopaedic Surgery

## 2021-06-22 ENCOUNTER — Ambulatory Visit: Payer: PPO | Admitting: Orthopaedic Surgery

## 2021-06-22 ENCOUNTER — Other Ambulatory Visit: Payer: Self-pay

## 2021-06-22 ENCOUNTER — Ambulatory Visit: Payer: Self-pay

## 2021-06-22 DIAGNOSIS — Z96642 Presence of left artificial hip joint: Secondary | ICD-10-CM | POA: Diagnosis not present

## 2021-06-22 DIAGNOSIS — Z96641 Presence of right artificial hip joint: Secondary | ICD-10-CM | POA: Diagnosis not present

## 2021-06-22 MED ORDER — AMOXICILLIN 500 MG PO CAPS: ORAL_CAPSULE | ORAL | 2 refills | Status: DC

## 2021-06-22 NOTE — Progress Notes (Signed)
Office Visit Note   Patient: Lori Cook           Date of Birth: [birth year]           MRN: [id number] Visit Date: 06/22/2021              Requested by: Wendie Agreste, MD [id number] [place],  [place] [id number] PCP: Camillia Herter, NP   Assessment & Plan: Visit Diagnoses:  1. Status post total replacement of right hip   2. Status post total hip replacement, right   3. Status post total replacement of left hip     Plan: Impression is status post sequential bilateral total hip replacements doing well.  She will continue to advance with activity as tolerated.  Dental prophylaxis reinforced.  Follow-up with [place] in 1 year for repeat evaluation and AP pelvis x-rays.  Call with concerns or questions in the meantime.  Follow-Up Instructions: Return in about 1 year (around 06/22/2022).   Orders:  Orders Placed This Encounter  Procedures   XR HIPS BILAT W OR W/O PELVIS 3-4 VIEWS   Meds ordered this encounter  Medications   amoxicillin (AMOXIL) 500 MG capsule    Sig: Take 4 pills one hour prior to dental work    Dispense:  8 capsule    Refill:  2       Procedures: No procedures performed   Clinical Data: No additional findings.   Subjective: Chief Complaint  Patient presents with   Right Hip - Pain   Left Hip - Pain    HPI patient is a very pleasant 70 year old female is here today for follow-up of bilateral total hip replacements, left 11/25/2019 and right 06/15/2020.  She has been doing well.  She notes an occasional discomfort to the left hip with increased activity.  Overall, very pleased with the outcome.  Review of Systems as detailed in HPI.  All others reviewed and are negative.   Objective: Vital Signs: There were no vitals taken for this visit.  Physical Exam well-developed and well-nourished female in no acute distress.  Alert and oriented x3.  Ortho Exam examination of both hips reveals painless logroll.  Full strength.  She is  neurovascular intact distally.  Specialty Comments:  No specialty comments available.  Imaging: XR HIPS BILAT W OR W/O PELVIS 3-4 VIEWS  Result Date: 06/22/2021 X-rays reveal well-seated prostheses without complication    PMFS History: Patient Active Problem List   Diagnosis Date Noted   Prediabetes 02/21/2021   Status post total replacement of right hip 06/15/2020   Primary osteoarthritis of right hip 06/14/2020   Vitamin D insufficiency 01/09/2020   Osteoporosis without current pathological fracture 01/07/2020   Hypercalcemia 01/07/2020   Status post total replacement of left hip 11/25/2019   Calculus of gallbladder without cholecystitis without obstruction 05/07/2016   Nephrolithiasis 05/07/2016   Pulmonary nodules 05/07/2016   Hematuria, microscopic 05/07/2016   Essential hypertension, benign 04/24/2015   Allergic rhinitis due to pollen 04/24/2015   Retinal vein occlusion, branch 04/24/2015   Retinal vein occlusion 03/08/2015   Essential hypertension-new onset 03/08/2015   Hyperlipidemia with target LDL less than 130 09/09/2012   Past Medical History:  Diagnosis Date   Allergy    Arthritis    Blood transfusion without reported diagnosis    Cataract    Cholelithiasis    large stone detected by CT 01/2016   GERD (gastroesophageal reflux disease)    Hyperlipidemia  Hypertension    Nephrolithiasis    52mm kidney stone L by CT 01/2016   Osteoporosis    Retinal vein occlusion, 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     Family History  Problem Relation Age of Onset   Stroke Father    Diabetes Father    Heart disease Father 59       AMI   Hypertension Sister    Arthritis Brother    Mental illness Mother    Heart disease Paternal Uncle    Colon cancer Neg Hx    Colon polyps Neg Hx    Esophageal cancer Neg Hx    Rectal cancer Neg Hx    Stomach cancer Neg Hx     Past Surgical History:  Procedure Laterality Date   ABDOMINAL HYSTERECTOMY      APPENDECTOMY     CHOLECYSTECTOMY     COLONOSCOPY     [facility]- records purges and destroyed- per pt Normal    JOINT REPLACEMENT N/A    [employer] 01/08/2021   SPINE SURGERY     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   Procedure: LEFT TOTAL HIP ARTHROPLASTY ANTERIOR APPROACH;  Surgeon: Leandrew Koyanagi, MD;  Location: [place];  Service: Orthopedics;  Laterality: Left;   TOTAL HIP ARTHROPLASTY Right 06/15/2020   Procedure: RIGHT TOTAL HIP ARTHROPLASTY ANTERIOR APPROACH;  Surgeon: Leandrew Koyanagi, MD;  Location: [place];  Service: Orthopedics;  Laterality: Right;   Social History   Occupational History   Not on file  Tobacco Use   Smoking status: Former    Packs/day: 1.50    Years: 39.00    Pack years: 58.50    Types: Cigarettes    Start date: 60    Quit date: 09/08/2010    Years since quitting: 10.7   Smokeless tobacco: Never  Vaping Use   Vaping Use: Never used  Substance and Sexual Activity   Alcohol use: No   Drug use: No   Sexual activity: Not on file

## 2021-07-12 ENCOUNTER — Encounter: Payer: Self-pay | Admitting: Family

## 2021-07-13 ENCOUNTER — Ambulatory Visit (INDEPENDENT_AMBULATORY_CARE_PROVIDER_SITE_OTHER): Payer: PPO

## 2021-07-13 ENCOUNTER — Ambulatory Visit
Admission: EM | Admit: 2021-07-13 | Discharge: 2021-07-13 | Disposition: A | Discharge status: Home / Self Care | Payer: PPO | Attending: Physician Assistant | Admitting: Physician Assistant

## 2021-07-13 ENCOUNTER — Other Ambulatory Visit: Payer: Self-pay

## 2021-07-13 DIAGNOSIS — M79661 Pain in right lower leg: Secondary | ICD-10-CM | POA: Diagnosis not present

## 2021-07-13 MED ORDER — GABAPENTIN 100 MG PO CAPS: 100.0000 mg | ORAL_CAPSULE | Freq: Every day | ORAL | 0 refills | Status: DC

## 2021-07-13 NOTE — Discharge Instructions (Addendum)
No acute findings on imaging today Continue with Tylenol as needed Follow up with Primary Care Physician

## 2021-07-13 NOTE — ED Triage Notes (Signed)
Ongoing h/o anterior right lower leg pain. Pt notes sxs started intermittently around 75mo ago and has increased in frequency within the last week. Pain is interfering with her sleep. Has been taking tylenol w/o relief. Pt has also taken percocet with relief. No injuries or bruising. Pt notes some swelling which she attributed to elevated BP. Walking and sitting do not aggravate sxs.

## 2021-07-13 NOTE — ED Provider Notes (Signed)
[facility]    CSN: [id number] Arrival date & time: 07/13/21  1629      History   Chief Complaint Chief Complaint  Patient presents with   Leg Pain    Right lower    HPI Lori Cook is a 70 y.o. female.   Pt complains of intermittent right lower leg pain that started about four months ago.  Denies injury or trauma.  She reports pain has become much worse over the last week.  No new injury or trauma.  She reports difficutly sleeping due to pain.  She is taking tylenol, percocet with no relief.  Nothing seems to make the sx Cook or worse. Pt seen by PCP in September with similar complaint, deferred imaging at that time. She had a lesion removed from the right lower leg several months ago, reports benign, and initially some improvement of pain. She is on her feet a lot at work, cleans home, but reports pain is not consistently worse with activity.  Denies numbness or tingling.     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   Primary osteoarthritis of right hip 06/14/2020   Vitamin D insufficiency 01/09/2020   Osteoporosis without current pathological fracture 01/07/2020   Hypercalcemia 01/07/2020   Status post total replacement of left hip 11/25/2019   Calculus of gallbladder without cholecystitis without obstruction 05/07/2016   Nephrolithiasis 05/07/2016   Pulmonary nodules 05/07/2016   Hematuria, microscopic 05/07/2016   Essential hypertension, benign 04/24/2015    Allergic rhinitis due to pollen 04/24/2015   Retinal vein occlusion, branch 04/24/2015   Retinal vein occlusion 03/08/2015   Essential hypertension-new onset 03/08/2015   Hyperlipidemia with target LDL less than 130 09/09/2012    Past Surgical History:  Procedure Laterality Date   ABDOMINAL HYSTERECTOMY     APPENDECTOMY     CHOLECYSTECTOMY     COLONOSCOPY     [facility]- records purges and destroyed- per pt Normal    JOINT REPLACEMENT N/A    Phreesia 01/08/2021   SPINE SURGERY     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   Procedure: LEFT TOTAL HIP ARTHROPLASTY ANTERIOR APPROACH;  Surgeon: Leandrew Koyanagi, MD;  Location: [place];  Service: Orthopedics;  Laterality: Left;   TOTAL HIP ARTHROPLASTY Right 06/15/2020   Procedure: RIGHT TOTAL HIP ARTHROPLASTY ANTERIOR APPROACH;  Surgeon: Leandrew Koyanagi, MD;  Location: [place];  Service: Orthopedics;  Laterality: Right;    OB History   No obstetric history on file.      Home Medications    Prior to Admission medications   Medication Sig Start Date End Date Taking? Authorizing Provider  acetaminophen (TYLENOL) 500 MG tablet Take 1,000 mg by mouth every 6 (six) hours as needed for moderate pain.     [provider]  albuterol (VENTOLIN HFA) 108 (90 Base) MCG/ACT inhaler Inhale 2 puffs  into the lungs every 6 (six) hours as needed for wheezing or shortness of breath. 04/03/21   Fransico Meadow, PA-C  alendronate (FOSAMAX) 70 MG tablet TAKE 1 TABLET BY MOUTH ONCE A WEEK TAKE  WITH  A  FULL  GLASS  OF  WATER  ON  AN  EMPTY  STOMACH 04/19/21   Shamleffer, Melanie Crazier, MD  amLODipine (NORVASC) 10 MG tablet Take 1 tablet (10 mg total) by mouth daily. 05/31/21 08/29/21  Camillia Herter, NP  amoxicillin (AMOXIL) 500 MG capsule Take 4 pills one hour prior to dental work 06/22/21   Aundra Dubin, PA-C  aspirin EC 81 MG tablet Take 1 tablet (81 mg total) by mouth in the morning and at bedtime. 06/09/20   Aundra Dubin, PA-C   cetirizine (ZYRTEC ALLERGY) 10 MG tablet Take 1 tablet (10 mg total) by mouth daily. 06/12/21   Jaynee Eagles, PA-C  Cholecalciferol (VITAMIN D3 PO) Take by mouth.    [provider]  doxycycline (VIBRAMYCIN) 100 MG capsule Take 1 capsule (100 mg total) by mouth 2 (two) times daily. Patient not taking: Reported on 05/19/2021 04/03/21   Fransico Meadow, PA-C  famotidine (PEPCID) 20 MG tablet Take 1 tablet (20 mg total) by mouth 2 (two) times daily. 06/12/21   Jaynee Eagles, PA-C  fluticasone (FLONASE) 50 MCG/ACT nasal spray Place 2 sprays into both nostrils daily. 04/17/20   Tasia Catchings, Amy V, PA-C  furosemide (LASIX) 20 MG tablet Take 1 tablet (20 mg total) by mouth daily. 02/22/21   Camillia Herter, NP  hydrochlorothiazide (HYDRODIURIL) 25 MG tablet Take 1 tablet (25 mg total) by mouth daily. 05/31/21 08/29/21  Camillia Herter, NP  naproxen sodium (ALEVE) 220 MG tablet Take 440 mg by mouth 2 (two) times daily as needed (pain).     [provider]  predniSONE (DELTASONE) 10 MG tablet Take 3 tablets (30 mg total) by mouth daily with breakfast. 05/19/21   Jaynee Eagles, PA-C  simvastatin (ZOCOR) 40 MG tablet Take 1 tablet by mouth once daily with breakfast 05/31/21   Camillia Herter, NP    Family History Family History  Problem Relation Age of Onset   Stroke Father    Diabetes Father    Heart disease Father 68       AMI   Hypertension Sister    Arthritis Brother    Mental illness Mother    Heart disease Paternal Uncle    Colon cancer Neg Hx    Colon polyps Neg Hx    Esophageal cancer Neg Hx    Rectal cancer Neg Hx    Stomach cancer Neg Hx     Social History Social History   Tobacco Use   Smoking status: Former    Packs/day: 1.50    Years: 39.00    Pack years: 58.50    Types: Cigarettes    Start date: 20    Quit date: 09/08/2010    Years since quitting: 10.8   Smokeless tobacco: Never  Vaping Use   Vaping Use: Never used  Substance Use Topics   Alcohol use: No   Drug use: No      Allergies   Other, Tramadol, Codeine, and Oxycodone   Review of Systems Review of Systems  Constitutional:  Negative for chills and fever.  HENT:  Negative for ear pain and sore throat.   Eyes:  Negative for pain and visual disturbance.  Respiratory:  Negative for cough and shortness of breath.  Cardiovascular:  Negative for chest pain and palpitations.  Gastrointestinal:  Negative for abdominal pain and vomiting.  Genitourinary:  Negative for dysuria and hematuria.  Musculoskeletal:  Positive for myalgias. Negative for arthralgias and back pain.  Skin:  Negative for color change and rash.  Neurological:  Negative for seizures and syncope.  All other systems reviewed and are negative.   Physical Exam Triage Vital Signs ED Triage Vitals  Enc Vitals Group     BP 07/13/21 1646 (!) 144/81     Pulse Rate 07/13/21 1646 71     Resp 07/13/21 1646 18     Temp 07/13/21 1646 98.3 F (36.8 C)     Temp Source 07/13/21 1646 Oral     SpO2 07/13/21 1646 96 %     Weight --      Height --      Head Circumference --      Peak Flow --      Pain Score 07/13/21 1650 0     Pain Loc --      Pain Edu? --      Excl. in [place]? --    No data found.  Updated Vital Signs BP (!) 144/81 (BP Location: Left Arm)   Pulse 71   Temp 98.3 F (36.8 C) (Oral)   Resp 18   SpO2 96%   Visual Acuity Right Eye Distance:   Left Eye Distance:   Bilateral Distance:    Right Eye Near:   Left Eye Near:    Bilateral Near:     Physical Exam Vitals and nursing note reviewed.  Constitutional:      General: She is not in acute distress.    Appearance: She is well-developed.  HENT:     Head: Normocephalic and atraumatic.  Eyes:     Conjunctiva/sclera: Conjunctivae normal.  Cardiovascular:     Rate and Rhythm: Normal rate and regular rhythm.     Heart sounds: No murmur heard. Pulmonary:     Effort: Pulmonary effort is normal. No respiratory distress.     Breath sounds: Normal breath sounds.   Abdominal:     Palpations: Abdomen is soft.     Tenderness: There is no abdominal tenderness.  Musculoskeletal:     Cervical back: Neck supple.     Comments: No calf swelling or tenderness noted, no bruising or rash noted.  No tenderness to palpation  Skin:    General: Skin is warm and dry.  Neurological:     Mental Status: She is alert.     UC Treatments / Results  Labs (all labs ordered are listed, but only abnormal results are displayed) Labs Reviewed - No data to display  EKG   Radiology DG Tibia/Fibula Right  Result Date: 07/13/2021 CLINICAL DATA:  Right lower leg pain. EXAM: RIGHT TIBIA AND FIBULA - 2 VIEW COMPARISON:  None. FINDINGS: No cortical margins of the tibia and fibula are intact. There is no evidence of fracture or other focal bone lesions. No erosion or periosteal reaction knee and ankle alignment are maintained. Soft tissues are unremarkable. IMPRESSION: Negative radiographs of the right lower leg. No explanation for pain. Electronically Signed   By: Keith Rake M.D.   On: 07/13/2021 17:31    Procedures Procedures (including critical care time)  Medications Ordered in UC Medications - No data to display  Initial Impression / Assessment and Plan / UC Course  I have reviewed the triage vital signs and the nursing notes.  Pertinent labs &  imaging results that were available during my care of the patient were reviewed by me and considered in my medical decision making (see chart for details).     No right lower extremity swelling or tenderness to palpation.  Imaging negative. Discussed with pt.  Advised Tylenol as needed.  She is in no pain in clinic today.  Follow up with PCP.    Pt requests GI specialist due to chronic GERD.  Final Clinical Impressions(s) / UC Diagnoses   Final diagnoses:  Pain in right lower leg     Discharge Instructions      No acute findings on imaging today Continue with Tylenol as needed Follow up with Primary Care  Physician      ED Prescriptions   None    PDMP not reviewed this encounter.   Ward, Lenise Arena, PA-C 07/13/21 1749

## 2021-07-15 ENCOUNTER — Other Ambulatory Visit: Payer: Self-pay | Admitting: Family

## 2021-07-15 ENCOUNTER — Other Ambulatory Visit: Payer: Self-pay | Admitting: Internal Medicine

## 2021-07-15 DIAGNOSIS — K219 Gastro-esophageal reflux disease without esophagitis: Secondary | ICD-10-CM

## 2021-07-26 ENCOUNTER — Encounter: Payer: Self-pay | Admitting: Internal Medicine

## 2021-07-26 ENCOUNTER — Ambulatory Visit: Payer: PPO | Admitting: Internal Medicine

## 2021-07-26 ENCOUNTER — Other Ambulatory Visit: Payer: Self-pay

## 2021-07-26 ENCOUNTER — Other Ambulatory Visit (INDEPENDENT_AMBULATORY_CARE_PROVIDER_SITE_OTHER): Payer: PPO

## 2021-07-26 VITALS — BP 124/80 | HR 69 | Ht 65.98 in | Wt 157.8 lb

## 2021-07-26 DIAGNOSIS — M810 Age-related osteoporosis without current pathological fracture: Secondary | ICD-10-CM | POA: Diagnosis not present

## 2021-07-26 DIAGNOSIS — E559 Vitamin D deficiency, unspecified: Secondary | ICD-10-CM

## 2021-07-26 LAB — BASIC METABOLIC PANEL
BUN: 29 mg/dL — ABNORMAL HIGH (ref 6–23)
CO2: 29 mEq/L (ref 19–32)
Calcium: 10.4 mg/dL (ref 8.4–10.5)
Chloride: 95 mEq/L — ABNORMAL LOW (ref 96–112)
Creatinine, Ser: 1.02 mg/dL (ref 0.40–1.20)
GFR: 56.24 mL/min — ABNORMAL LOW (ref >60.00)
Glucose, Bld: 93 mg/dL (ref 70–99)
Potassium: 3.1 mEq/L — ABNORMAL LOW (ref 3.5–5.1)
Sodium: 138 mEq/L (ref 135–145)

## 2021-07-26 LAB — VITAMIN D 25 HYDROXY (VIT D DEFICIENCY, FRACTURES): VITD: 36.04 ng/mL (ref 30.00–100.00)

## 2021-07-26 LAB — ALBUMIN: Albumin: 4.7 g/dL (ref 3.5–5.2)

## 2021-07-26 MED ORDER — ALENDRONATE SODIUM 70 MG PO TABS: 70.0000 mg | ORAL_TABLET | ORAL | 3 refills | Status: DC

## 2021-07-26 MED ORDER — POTASSIUM CHLORIDE ER 20 MEQ PO TBCR: 20.0000 meq | EXTENDED_RELEASE_TABLET | Freq: Every day | ORAL | 0 refills | Status: DC

## 2021-07-26 NOTE — Progress Notes (Signed)
Name: Lori Cook  MRN/ DOB: [id number], Jan 21, 1951    Age/ Sex: 70 y.o., female     PCP: Camillia Herter, NP   Reason for Endocrinology Evaluation: Hypercalcemia      Initial Endocrinology Clinic Visit: 01/08/2020    PATIENT IDENTIFIER: Lori Cook is a 70 y.o., female with a past medical history of Osteopenia and dyslipidemia. She has followed with [facility] since 01/08/2020 for consultative assistance with management of her hypercalcemia.   HISTORICAL SUMMARY: The patient was first diagnosed with intermittent hypercalcemia since 2016  with serum calcium os 10.5 mg/dL ( Corrected calcium 9.78) and again in 2019 , serum calcium 10.4 ( corrected 9.84 mg/dL ) and in 10/2019 at 10.8 ( Corrected 10.32).  Her PTH has been fluctuating as high as 75 pg/mL .  She has a hx of renal stones  She has low bone density on DXA ( 10/24/2018)  As well as slip and fall wrist fracture  She was started on Alendronate in 12/2019 due to clinical osteoporosis   HCTZ was stopped in 10/2019  24-hr urine Ca/Cr ratio of 0.019 with calcium of 224 mg  SUBJECTIVE:     Today (07/26/2021):  Lori Cook is here for hypercalcemia and osteoporosis Takes PPI to control cough due to GERD which has been worsening  Denies polydipsia but is currently on Lasix  Denies constipation or diarrhea  No renal stones   S/P left hip replacement in 06/2020  HOME ENDOCRINE MEDICATIONS: Alendronate 70 mg weekly  Vitamin D 2000 iu daily       HISTORY:  Past Medical History:  Past Medical History:  Diagnosis Date   Allergy    Arthritis    Blood transfusion without reported diagnosis    Cataract    Cholelithiasis    large stone detected by CT 01/2016   GERD (gastroesophageal reflux disease)    Hyperlipidemia    Hypertension    Nephrolithiasis    66mm kidney stone L by CT 01/2016   Osteoporosis    Retinal vein occlusion, 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    Past  Surgical History:  Past Surgical History:  Procedure Laterality Date   ABDOMINAL HYSTERECTOMY     APPENDECTOMY     CHOLECYSTECTOMY     COLONOSCOPY     [facility]- records purges and destroyed- per pt Normal    JOINT REPLACEMENT N/A    Phreesia 01/08/2021   SPINE SURGERY     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   Procedure: LEFT TOTAL HIP ARTHROPLASTY ANTERIOR APPROACH;  Surgeon: Leandrew Koyanagi, MD;  Location: [place];  Service: Orthopedics;  Laterality: Left;   TOTAL HIP ARTHROPLASTY Right 06/15/2020   Procedure: RIGHT TOTAL HIP ARTHROPLASTY ANTERIOR APPROACH;  Surgeon: Leandrew Koyanagi, MD;  Location: [place];  Service: Orthopedics;  Laterality: Right;   Social History:  reports that she quit smoking about 10 years ago. Her smoking use included cigarettes. She started smoking about 50 years ago. She has a 58.50 pack-year smoking history. She has never used smokeless tobacco. She reports that she does not drink alcohol and does not use drugs. Family History:  Family History  Problem Relation Age of Onset   Stroke Father    Diabetes Father    Heart disease Father 25       AMI   Hypertension Sister    Arthritis Brother    Mental illness Mother  Heart disease Paternal Uncle    Colon cancer Neg Hx    Colon polyps Neg Hx    Esophageal cancer Neg Hx    Rectal cancer Neg Hx    Stomach cancer Neg Hx      HOME MEDICATIONS: Allergies as of 07/26/2021       Reactions   Other    Tramadol Nausea And Vomiting   Codeine Other (See Comments)   Headache   Oxycodone Palpitations        Medication List        Accurate as of July 26, 2021  4:30 PM. If you have any questions, ask your nurse or doctor.          STOP taking these medications    amoxicillin 500 MG capsule Commonly known as: AMOXIL Stopped by: Dorita Sciara, MD   doxycycline 100 MG capsule Commonly known as: VIBRAMYCIN Stopped by: Dorita Sciara, MD   predniSONE 10 MG tablet Commonly  known as: DELTASONE Stopped by: Dorita Sciara, MD       TAKE these medications    acetaminophen 500 MG tablet Commonly known as: TYLENOL Take 1,000 mg by mouth every 6 (six) hours as needed for moderate pain.   albuterol 108 (90 Base) MCG/ACT inhaler Commonly known as: VENTOLIN HFA Inhale 2 puffs into the lungs every 6 (six) hours as needed for wheezing or shortness of breath.   alendronate 70 MG tablet Commonly known as: FOSAMAX TAKE 1 TABLET BY MOUTH ONCE A WEEK **TAKE  WITH  A  FULL  GLASS  OF  WATER  ON  AN  EMPTY  STOMACH   amLODipine 10 MG tablet Commonly known as: NORVASC Take 1 tablet (10 mg total) by mouth daily.   aspirin EC 81 MG tablet Take 1 tablet (81 mg total) by mouth in the morning and at bedtime.   cetirizine 10 MG tablet Commonly known as: ZyrTEC Allergy Take 1 tablet (10 mg total) by mouth daily.   famotidine 20 MG tablet Commonly known as: PEPCID Take 1 tablet (20 mg total) by mouth 2 (two) times daily.   fluticasone 50 MCG/ACT nasal spray Commonly known as: FLONASE Place 2 sprays into both nostrils daily.   furosemide 20 MG tablet Commonly known as: LASIX Take 1 tablet (20 mg total) by mouth daily.   gabapentin 100 MG capsule Commonly known as: NEURONTIN Take 1 capsule (100 mg total) by mouth at bedtime.   hydrochlorothiazide 25 MG tablet Commonly known as: HYDRODIURIL Take 1 tablet (25 mg total) by mouth daily.   naproxen sodium 220 MG tablet Commonly known as: ALEVE Take 440 mg by mouth 2 (two) times daily as needed (pain).   Potassium Chloride ER 20 MEQ Tbcr Take 20 mEq by mouth daily in the afternoon. Started by: Dorita Sciara, MD   simvastatin 40 MG tablet Commonly known as: ZOCOR Take 1 tablet by mouth once daily with breakfast   VITAMIN D3 PO Take by mouth.          OBJECTIVE:   PHYSICAL EXAM: VS: BP 124/80 (BP Location: Left Arm, Patient Position: Sitting, Cuff Size: Small)   Pulse 69   Ht 5'  5.98" (1.676 m)   Wt 157 lb 12.8 oz (71.6 kg)   SpO2 99%   BMI 25.49 kg/m    EXAM: General: Pt appears well and is in NAD  Neck: General: Supple without adenopathy. Thyroid: Thyroid size normal.  No goiter or nodules appreciated. No thyroid  bruit.  Lungs: Clear with good BS bilat with no rales, rhonchi, or wheezes  Heart: Auscultation: RRR.  Abdomen: Normoactive bowel sounds, soft, nontender, without masses or organomegaly palpable  Extremities:  BL LE: No pretibial edema normal ROM and strength.  Mental Status: Judgment, insight: Intact Orientation: Oriented to time, place, and person Memory: Intact for recent and remote events Mood and affect: No depression, anxiety, or agitation     DATA REVIEWED:  Results for DAIRA, HINE (MRN [id number]) as of 07/24/2020 13:05  Ref. Range 07/24/2020 09:39  Sodium Latest Ref Range: 135 - 145 mEq/L 140  Potassium Latest Ref Range: 3.5 - 5.1 mEq/L 4.0  Chloride Latest Ref Range: 96 - 112 mEq/L 104  CO2 Latest Ref Range: 19 - 32 mEq/L 27  Glucose Latest Ref Range: 70 - 99 mg/dL 93  BUN Latest Ref Range: 6 - 23 mg/dL 19  Creatinine Latest Ref Range: 0.40 - 1.20 mg/dL 0.85  Calcium Latest Ref Range: 8.4 - 10.5 mg/dL 9.8  Albumin Latest Ref Range: 3.5 - 5.2 g/dL 4.3  GFR Latest Ref Range: >60.00 mL/min 70.49  VITD Latest Ref Range: 30.00 - 100.00 ng/mL 19.40 (L)     ASSESSMENT / PLAN / RECOMMENDATIONS:   Hypercalcemia    - This has resolved, possible underlying Primary hyperparathyroidism  - 24-hr urine Ca/Cr ratio of 0.019 with calcium of 224 mg - Encouraged hydration  - Advised to continue avoiding OTC calcium  - Pt to consume 2-3 servings of dietary calcium     2. Clinical osteoporosis:  We discussed the high FRAX rate  Of > 3% at the hips as well as a hx of fragility fracture in the past - She is tolerating it well  - Pt will schedule a DXA scan at the breast center    Medication :   Continue Alendronate 70 mg  weekly    3. Vitamin D Deficiency :   - Resolved    Medication  Vitamin D3 2000 iu daily    4. Hypokalemia :  - Will replenish  - Most likely due to HCTZ and Furosemide   - Pt to have this rechecked in 2 weeks at PCP's office    Medication  Start KCL 20 mEq daily    F/U in 1 yr     Signed electronically by: Mack Guise, MD  [facility]  [facility] [address] [place], [place] [address] Phone: [phone] FAX: [phone]      CC: Camillia Herter, NP [place] [address] Phone: [phone]  Fax: [phone]   Return to Endocrinology clinic as below: Future Appointments  Date Time Provider [place]  08/18/2021  8:30 AM Armbruster, Carlota Raspberry, MD [facility]  08/30/2021  2:40 PM Camillia Herter, NP [facility] None  06/28/2022  4:00 PM Leandrew Koyanagi, MD [facility] None  08/01/2022  8:10 AM Yanitza Shvartsman, Melanie Crazier, MD [facility] None

## 2021-07-26 NOTE — Patient Instructions (Addendum)
-   Stay Hydrated  - Avoid over the counter calcium tablets  - Consume 2-3 servings of calcium in your diet daily  - Continue Vitamin D 2000 iu daily     Please contact the [facility] for bone density test

## 2021-07-27 ENCOUNTER — Encounter: Payer: Self-pay | Admitting: Internal Medicine

## 2021-07-27 LAB — PARATHYROID HORMONE, INTACT (NO CA): PTH: 92 pg/mL — ABNORMAL HIGH (ref 16–77)

## 2021-07-27 LAB — CALCIUM, IONIZED: Calcium, Ion: 5.22 mg/dL (ref 4.8–5.6)

## 2021-08-16 ENCOUNTER — Other Ambulatory Visit: Payer: Self-pay | Admitting: Family

## 2021-08-16 DIAGNOSIS — M25471 Effusion, right ankle: Secondary | ICD-10-CM

## 2021-08-16 DIAGNOSIS — M25472 Effusion, left ankle: Secondary | ICD-10-CM

## 2021-08-16 NOTE — Progress Notes (Signed)
Patient ID: Lori Cook, female    DOB: [birth year]  MRN: [id number]  CC: Hypertension Follow-Up   Subjective: Lori Cook is a 70 y.o. female who presents for hypertension follow-up.   Her concerns today include:  HYPERTENSION FOLLOW-UP: 05/31/2021: - Continue Amlodipine and Hydrochlorothiazide as prescribed.   07/26/2021 per Cloyd Stagers, MD: You potassium is low this is due to lasix and HCTZ. PLease  take potassium daily ( prescription sent ) please have this rechecked by primary care in 2-3 weeks to adjust the dose of potassium or water pills   08/20/2021: Reports still taking blood pressure medications as prescribed. Not checking blood pressures at home. Denies chest pain, shortness of breath, and additional red flag symptoms.   2. BILATERAL LEG EDEMA: 3. RIGHT LEG PAIN FOLLOW-UP: 07/13/2021 at [facility] per PA note: No right lower extremity swelling or tenderness to palpation.  Imaging negative. Discussed with pt.  Advised Tylenol as needed. She is in no pain in clinic today.  Follow up with PCP.   Pt requests GI specialist due to chronic GERD.   08/20/2021: Fluctuating. Feet swelling intermittently. Recently left knee swelling, reports she had [place] prior to that which may of had more than normal amount of salt. Reports bilateral leg swelling worsens throughout the day. Tends to notice swelling when sitting down with legs elevated reporting thinks more noticeable at that time because she is looking at it. She does stand on her feet a lot while at work, not wearing compression stockings. Reports if sitting down for a while when she does stand has some discomfort. Denies bilateral pain, warmth, redness, chest pain, and shortness of breath. Still taking Lasix and potassium pills as prescribed. Requesting refill of Gabapentin which helps.  Patient Active Problem List   Diagnosis Date Noted   Prediabetes 02/21/2021   Status post  total replacement of right hip 06/15/2020   Primary osteoarthritis of right hip 06/14/2020   Vitamin D insufficiency 01/09/2020   Osteoporosis without current pathological fracture 01/07/2020   Hypercalcemia 01/07/2020   Status post total replacement of left hip 11/25/2019   Calculus of gallbladder without cholecystitis without obstruction 05/07/2016   Nephrolithiasis 05/07/2016   Pulmonary nodules 05/07/2016   Hematuria, microscopic 05/07/2016   Essential hypertension, benign 04/24/2015   Allergic rhinitis due to pollen 04/24/2015   Retinal vein occlusion, branch 04/24/2015   Retinal vein occlusion 03/08/2015   Essential hypertension-new onset 03/08/2015   Hyperlipidemia with target LDL less than 130 09/09/2012     Current Outpatient Medications on File Prior to Visit  Medication Sig Dispense Refill   acetaminophen (TYLENOL) 500 MG tablet Take 1,000 mg by mouth every 6 (six) hours as needed for moderate pain.      alendronate (FOSAMAX) 70 MG tablet Take 1 tablet (70 mg total) by mouth once a week. Take with a full glass of water on an empty stomach. 12 tablet 3   aspirin EC 81 MG tablet Take 81 mg by mouth daily. Swallow whole.     cetirizine (ZYRTEC ALLERGY) 10 MG tablet Take 1 tablet (10 mg total) by mouth daily. 30 tablet 0   Cholecalciferol (VITAMIN D3 PO) Take by mouth.     fluticasone (FLONASE) 50 MCG/ACT nasal spray Place 2 sprays into both nostrils daily. 1 g 0   furosemide (LASIX) 20 MG tablet Take 1 tablet by mouth once daily 30 tablet 0   naproxen sodium (ALEVE) 220 MG tablet Take 440 mg  by mouth 2 (two) times daily as needed (pain).     omeprazole (PRILOSEC) 40 MG capsule Take one capsule by mouth once daily 60 minutes before a meal for 6 weeks. Please let Dr. Havery Moros know if you decide to start this medication. 45 capsule 0   Potassium Chloride ER 20 MEQ TBCR Take 20 mEq by mouth daily in the afternoon. 90 tablet 0   simvastatin (ZOCOR) 40 MG tablet Take 1 tablet by  mouth once daily with breakfast 90 tablet 0   No current facility-administered medications on file prior to visit.    Allergies  Allergen Reactions   Other    Tramadol Nausea And Vomiting   Codeine Other (See Comments)    Headache   Oxycodone Palpitations    Social History   Socioeconomic History   Marital status: Single    Spouse name: Not on file   Number of children: Not on file   Years of education: Not on file   Highest education level: Not on file  Occupational History   Not on file  Tobacco Use   Smoking status: Former    Packs/day: 1.50    Years: 39.00    Pack years: 58.50    Types: Cigarettes    Start date: 81    Quit date: 09/08/2010    Years since quitting: 10.9   Smokeless tobacco: Never  Vaping Use   Vaping Use: Never used  Substance and Sexual Activity   Alcohol use: No   Drug use: No   Sexual activity: Not on file  Other Topics Concern   Not on file  Social History Narrative   Marital status: single; not dating; not interested in 2018     Children: 2 daughters, 1 son passed away 37.43 years old.  Two grandchildren (20,8)      Lives: with daughter, grandson.      Employment:  Education administrator business full time; one employee      Tobacco: quit smoking      Alcohol:  None      Exercise: no formal exercise      ADLs: independent with ADLs; drives; no assistant devices.       Advanced Directives: none in 2018.  FULL CODE; no prolonged measures. HCPOA: daughters   Social Determinants of Radio broadcast assistant Strain: Not on file  Food Insecurity: Not on file  Transportation Needs: Not on file  Physical Activity: Not on file  Stress: Not on file  Social Connections: Not on file  Intimate Partner Violence: Not on file    Family History  Problem Relation Age of Onset   Stroke Father    Diabetes Father    Heart disease Father 8       AMI   Hypertension Sister    Arthritis Brother    Mental illness Mother    Heart disease Paternal Uncle     Colon cancer Neg Hx    Colon polyps Neg Hx    Esophageal cancer Neg Hx    Rectal cancer Neg Hx    Stomach cancer Neg Hx     Past Surgical History:  Procedure Laterality Date   ABDOMINAL HYSTERECTOMY     APPENDECTOMY     CHOLECYSTECTOMY     COLONOSCOPY     [facility]- records purges and destroyed- per pt Normal    JOINT REPLACEMENT N/A    [employer] 01/08/2021   SPINE SURGERY     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  Procedure: LEFT TOTAL HIP ARTHROPLASTY ANTERIOR APPROACH;  Surgeon: Leandrew Koyanagi, MD;  Location: [place];  Service: Orthopedics;  Laterality: Left;   TOTAL HIP ARTHROPLASTY Right 06/15/2020   Procedure: RIGHT TOTAL HIP ARTHROPLASTY ANTERIOR APPROACH;  Surgeon: Leandrew Koyanagi, MD;  Location: [place];  Service: Orthopedics;  Laterality: Right;    ROS: Review of Systems Negative except as stated above  PHYSICAL EXAM: BP 126/78 (BP Location: Left Arm, Patient Position: Sitting, Cuff Size: Normal)   Pulse 64   Temp 98.3 F (36.8 C)   Resp 18   Ht 5' 5.98" (1.676 m)   Wt 154 lb (69.9 kg)   SpO2 97%   BMI 24.87 kg/m   Physical Exam HENT:     Head: Normocephalic and atraumatic.  Eyes:     Extraocular Movements: Extraocular movements intact.     Conjunctiva/sclera: Conjunctivae normal.     Pupils: Pupils are equal, round, and reactive to light.  Cardiovascular:     Rate and Rhythm: Normal rate and regular rhythm.     Pulses: Normal pulses.     Heart sounds: Normal heart sounds.  Pulmonary:     Effort: Pulmonary effort is normal.     Breath sounds: Normal breath sounds.  Musculoskeletal:        General: Normal range of motion.     Cervical back: Normal range of motion and neck supple.     Comments: No evidence of bilateral leg edema, erythema, tenderness, or warmth present today on exam.  Neurological:     General: No focal deficit present.     Mental Status: She is alert and oriented to person, place, and time.  Psychiatric:        Mood and Affect:  Mood normal.        Behavior: Behavior normal.   ASSESSMENT AND PLAN: 1. Essential hypertension: - Continue Amlodipine and Hydrochlorothiazide as prescribed.  - Counseled on blood pressure goal of less than 140/90, low-sodium, DASH diet, medication compliance, 150 minutes of moderate intensity exercise per week as tolerated. Discussed medication compliance, adverse effects. - BMP to evaluate kidney function and electrolyte balance. - Follow-up with primary provider in 3 months or sooner if needed.  - Basic Metabolic Panel - amLODipine (NORVASC) 10 MG tablet; Take 1 tablet (10 mg total) by mouth daily.  Dispense: 90 tablet; Refill: 0 - hydrochlorothiazide (HYDRODIURIL) 25 MG tablet; Take 1 tablet (25 mg total) by mouth daily.  Dispense: 90 tablet; Refill: 0  2. Hyperlipidemia, unspecified hyperlipidemia type: - Practice low-fat heart healthy diet and at least 150 minutes of moderate intensity exercise weekly as tolerated.  - Continue Simvastatin as prescribed.  - Follow-up with primary provider as scheduled.  - simvastatin (ZOCOR) 40 MG tablet; Take 1 tablet by mouth once daily with breakfast  Dispense: 90 tablet; Refill: 0  3. Right leg pain: 4. Bilateral leg edema: - Fluctuating.  - Continue Furosemide as prescribed. No refills needed as of present.  - Continue Gabapentin as prescribed.  - Will recheck potassium today. - Referral to Vascular Surgery for further evaluation and management.  - Follow-up with primary provider as scheduled.  - gabapentin (NEURONTIN) 100 MG capsule; Take 1 capsule (100 mg total) by mouth at bedtime.  Dispense: 90 capsule; Refill: 0 - Ambulatory referral to Vascular Surgery    Patient was given the opportunity to ask questions.  Patient verbalized understanding of the plan and was able to repeat key elements of the plan. Patient was given clear  instructions to go to Emergency Department or return to medical center if symptoms don't improve, worsen, or new  problems develop.The patient verbalized understanding.   Orders Placed This Encounter  Procedures   Basic Metabolic Panel   Ambulatory referral to Vascular Surgery    Requested Prescriptions   Signed Prescriptions Disp Refills   gabapentin (NEURONTIN) 100 MG capsule 90 capsule 0    Sig: Take 1 capsule (100 mg total) by mouth at bedtime.   amLODipine (NORVASC) 10 MG tablet 90 tablet 0    Sig: Take 1 tablet (10 mg total) by mouth daily.   hydrochlorothiazide (HYDRODIURIL) 25 MG tablet 90 tablet 0    Sig: Take 1 tablet (25 mg total) by mouth daily.    Return in about 3 months (around 11/18/2021) for Follow-Up or next available Dorna Mai, MD .  Camillia Herter, NP

## 2021-08-18 ENCOUNTER — Encounter: Payer: Self-pay | Admitting: Gastroenterology

## 2021-08-18 ENCOUNTER — Ambulatory Visit: Payer: PPO | Admitting: Gastroenterology

## 2021-08-18 VITALS — BP 100/70 | HR 72 | Ht 66.0 in | Wt 156.1 lb

## 2021-08-18 DIAGNOSIS — R053 Chronic cough: Secondary | ICD-10-CM

## 2021-08-18 DIAGNOSIS — M810 Age-related osteoporosis without current pathological fracture: Secondary | ICD-10-CM

## 2021-08-18 MED ORDER — OMEPRAZOLE 40 MG PO CPDR: DELAYED_RELEASE_CAPSULE | ORAL | 0 refills | Status: DC

## 2021-08-18 NOTE — Progress Notes (Signed)
HPI :  70 year old female known to me for her colonoscopy exams, here to follow-up for symptoms of chronic cough and concern for underlying reflux.  I have not seen her in the clinic before.  She states about 4 to 5 years ago she developed a cough that was rather persistent.  Etiology was unclear at the time and she took some Pepcid for this which she states thought it helped.  She essentially has stayed on Pepcid daily since that time for suspected reflux.  She never really has any heartburn or regurgitation or other more typical symptoms of reflux.  She states she has not really had any problems with cough since 4 to 5 years ago until this past June of this year.  She has been having persistent cough both at daytime and at nighttime.  She was evaluated with chest x-ray, told she had bronchitis and give a course of antibiotics which did not provide any benefit.  Now cough has been going on for several months and continues to bother her despite taking Pepcid every day.  She has tried taking Pepcid twice daily more recently.  With time she states the cough has gotten a little bit better in recent weeks and not bothering her as much.  She denies any voice changes, hoarse voice.  She has used tobacco for about 20 to 30 years but quit about 10 years ago.  She has occasional belching but generally denies any water brash, chest pains, pyrosis.  She denies any chest pain or shortness of breath otherwise.  She does have seasonal allergies but denies any postnasal drip.  She has been taking Flonase for sinus issues and ear aches.  Has seen ENT in the past and on Zantac as well, never had a prior laryngoscopy.  She denies any family history of esophageal cancer.  She has never had an upper endoscopy.  She has never had a trial of PPI.  She has assumed she has had reflux causing this over the past several years given initial response to Pepcid.  She does have a history of osteoporosis on Fosamax.  She has had 2 hip  replacements and a wrist fracture about 10 to 12 years ago.  She had a CT scan of her chest in December 2018 which suggested some benign pulmonary nodules which appeared stable over time and she was told she did not need any further follow-up.  She had a chest x-ray on October 1 of this year which did not show any abnormalities.   Prior work-up: Colonoscopy 10/29/20 - The perianal and digital rectal examinations were normal. - A few small-mouthed diverticula were found in the sigmoid colon. - The colon was tortuous. The rectosigmoid colon was extremely angulated and visualization was difficult in this area. - Internal hemorrhoids were found during retroflexion. The hemorrhoids were small. - The exam was otherwise without abnormality.  Likely no further screening needed   Colonoscopy 09/07/2017 - The perianal and digital rectal examinations were normal. - The terminal ileum appeared normal. - Localized mild inflammation characterized by erosions, erythema and friability was found in the cecum. Biopsies were taken with a cold forceps for histology. - The colon was tortuous with a very angulated rectosigmoid colon. - A 5 mm polyp was found in the transverse colon. The polyp was sessile. The polyp was removed with a cold snare. Resection and retrieval were complete. - Two sessile polyps were found in the sigmoid colon. The polyps were 4 to 5 mm in  size. These polyps were removed with a cold snare. Resection and retrieval were complete. - A 4 mm polyp was found in the recto-sigmoid colon. The polyp was sessile. The polyp was removed with a cold snare. Resection and retrieval were complete. - Internal hemorrhoids were found during retroflexion. The hemorrhoids were small. - The exam was otherwise without abnormality.     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      Past Surgical History:  Procedure Laterality Date   ABDOMINAL HYSTERECTOMY     APPENDECTOMY     CHOLECYSTECTOMY     COLONOSCOPY     [facility]- records purges and destroyed- per pt Normal    JOINT REPLACEMENT N/A    [employer] 01/08/2021   SPINE SURGERY     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   Procedure: LEFT TOTAL HIP ARTHROPLASTY ANTERIOR APPROACH;  Surgeon: Leandrew Koyanagi, MD;  Location: [place];  Service: Orthopedics;  Laterality: Left;   TOTAL HIP ARTHROPLASTY Right 06/15/2020   Procedure: RIGHT TOTAL HIP ARTHROPLASTY ANTERIOR APPROACH;  Surgeon: Leandrew Koyanagi, MD;  Location: [place];  Service: Orthopedics;  Laterality: Right;   Family History  Problem Relation Age of Onset   Stroke Father    Diabetes Father    Heart disease Father 105       AMI   Hypertension Sister    Arthritis Brother    Mental illness Mother    Heart disease Paternal Uncle    Colon cancer Neg Hx    Colon polyps Neg Hx    Esophageal cancer Neg Hx    Rectal cancer Neg Hx    Stomach cancer Neg Hx    Social History   Tobacco Use   Smoking status: Former    Packs/day: 1.50    Years: 39.00    Pack years: 58.50    Types: Cigarettes    Start date: 35    Quit date: 09/08/2010    Years since quitting: 10.9   Smokeless tobacco: Never  Vaping Use   Vaping Use: Never used  Substance Use Topics   Alcohol use: No   Drug use: No   Current Outpatient Medications  Medication Sig Dispense Refill   acetaminophen (TYLENOL) 500 MG tablet Take 1,000 mg by mouth every 6 (six) hours as needed for moderate pain.      alendronate (FOSAMAX) 70 MG tablet Take 1 tablet (70 mg total) by mouth once a week. Take with a full glass of water on an empty stomach. 12 tablet 3    amLODipine (NORVASC) 10 MG tablet Take 1 tablet (10 mg total) by mouth daily. 90 tablet 0   aspirin EC 81 MG tablet Take 81 mg by mouth daily. Swallow whole.     cetirizine (ZYRTEC ALLERGY) 10 MG tablet Take 1 tablet (10 mg total) by mouth daily. 30 tablet 0   Cholecalciferol (VITAMIN D3 PO) Take by mouth.     famotidine (PEPCID) 20 MG tablet Take 20 mg by mouth daily.  fluticasone (FLONASE) 50 MCG/ACT nasal spray Place 2 sprays into both nostrils daily. 1 g 0   furosemide (LASIX) 20 MG tablet Take 1 tablet by mouth once daily 30 tablet 0   gabapentin (NEURONTIN) 100 MG capsule Take 1 capsule (100 mg total) by mouth at bedtime. 30 capsule 0   hydrochlorothiazide (HYDRODIURIL) 25 MG tablet Take 1 tablet (25 mg total) by mouth daily. 90 tablet 0   naproxen sodium (ALEVE) 220 MG tablet Take 440 mg by mouth 2 (two) times daily as needed (pain).     Potassium Chloride ER 20 MEQ TBCR Take 20 mEq by mouth daily in the afternoon. 90 tablet 0   simvastatin (ZOCOR) 40 MG tablet Take 1 tablet by mouth once daily with breakfast 90 tablet 0   No current facility-administered medications for this visit.   Allergies  Allergen Reactions   Other    Tramadol Nausea And Vomiting   Codeine Other (See Comments)    Headache   Oxycodone Palpitations     Review of Systems: All systems reviewed and negative except where noted in HPI.    Lab Results  Component Value Date   WBC 10.1 11/03/2020   HGB 13.7 11/03/2020   HCT 41.3 11/03/2020   MCV 83 11/03/2020   PLT 340 11/03/2020    Lab Results  Component Value Date   CREATININE 1.02 07/26/2021   BUN 29 (H) 07/26/2021   NA 138 07/26/2021   K 3.1 (L) 07/26/2021   CL 95 (L) 07/26/2021   CO2 29 07/26/2021    Lab Results  Component Value Date   ALT 17 05/31/2021   AST 18 05/31/2021   ALKPHOS 80 05/31/2021   BILITOT 0.4 05/31/2021     Physical Exam: BP 100/70 (BP Location: Left Arm, Patient Position: Sitting, Cuff Size: Normal)   Pulse 72    Ht 5\' 6"  (1.676 m) Comment: height measured without shoes  Wt 156 lb 2 oz (70.8 kg)   BMI 25.20 kg/m  Constitutional: Pleasant,well-developed, female in no acute distress. HEENT: Normocephalic and atraumatic. Conjunctivae are normal. No scleral icterus. Neck supple.  Cardiovascular: Normal rate, regular rhythm.  Pulmonary/chest: Effort normal and breath sounds normal. No wheezing, rales or rhonchi. Neurological: Alert and oriented to person place and time. Psychiatric: Normal mood and affect. Behavior is normal.   ASSESSMENT AND PLAN: 70 year old female here for reassessment of the following:  Chronic cough  Osteoporosis  Patient here to discuss her chronic cough.  She has assumed she has had reflux for the past few years causing this however has never had any pyrosis or regurgitation/waterbrash or other more typical reflux symptoms.  That being said she thinks Pepcid had helped her cough initially 5 years ago when it started and has continued it daily for years thinking it was suppressing her cough.  Has not had issues with cough until this past summer where she had recurrent persisting cough for several months although more recently this has been rather mild.  I discussed differential diagnosis for chronic cough with her.  While GERD is certainly a possible cause of chronic cough it remains unclear to me if this is truly the cause of her symptoms.  She denies any wheezing, postnasal drip, etc, otherwise. She has never been on PPI in the past.  Discussed options at this point.  If her cough is persistent and bothering her at this point in time we could try her on a course of PPI for 6 weeks to see if  that makes any appreciable difference.  If PPI clearly resolves this cough then that would argue reflux is the likely cause.  If it does not provide much benefit then would need to evaluate for other causes.  Given she has been feeling better more recently up to her if she wants to try course of  PPI.  We did discuss short-term that PPIs are quite safe, however if she uses this long-term there is increased risk for bone fracture and in light of her osteoporosis this will need to be taken into consideration.  She has continued Pepcid, unclear if it is providing much benefit at this point or not, advised having her stop it and see how she feels.  If she notices worsening cough or more typical reflux symptoms then we will try the course of omeprazole.  If her cough is persistent despite this then would need to further evaluate with EGD and would also recommend referral to pulmonary for PFTs etc. and evaluation for other causes.  She agreed with the plan and verbalized understanding.  Plan: - she will stop pepcid and observe for worsening of cough or more typical reflux symptoms - if cough persists / worsens would try omeprazole 40mg  / day for 6 weeks - if resolves her symptoms argues for GERD, if not, consider alternatives. If symptoms persist despite PPI would consider EGD and pulmonary eval in this situation - discussed long term risks of PPIs. Short term course very safe to see if it helps her cough, long term use would increase her risk for bone fracture  Jolly Mango, MD [facility]

## 2021-08-18 NOTE — Patient Instructions (Addendum)
If you are age 69 or older, your body mass index should be between 23-30. Your Body mass index is 25.2 kg/m. If this is out of the aforementioned range listed, please consider follow up with your Primary Care Provider.  If you are age 50 or younger, your body mass index should be between 19-25. Your Body mass index is 25.2 kg/m. If this is out of the aformentioned range listed, please consider follow up with your Primary Care Provider.   ________________________________________________________  The [place] [facility] providers would like to encourage you to use [facility] to communicate with providers for non-urgent requests or questions.  Due to long hold times on the telephone, sending your provider a message by [facility] may be a faster and more efficient way to get a response.  Please allow 48 business hours for a response.  Please remember that this is for non-urgent requests.  _______________________________________________________  Discontinue Pepcid.  If not improved, try omeprazole 40 mg once daily (60 minutes before a meal) for 6 weeks.  This has been sent to your pharmacy. Please let us know if you start this medication.   Thank you for entrusting me with your care and for choosing [facility], [facility] [place] [facility]

## 2021-08-20 ENCOUNTER — Other Ambulatory Visit: Payer: Self-pay

## 2021-08-20 ENCOUNTER — Ambulatory Visit (INDEPENDENT_AMBULATORY_CARE_PROVIDER_SITE_OTHER): Payer: PPO | Admitting: Family

## 2021-08-20 ENCOUNTER — Encounter: Payer: Self-pay | Admitting: Family

## 2021-08-20 VITALS — BP 126/78 | HR 64 | Temp 98.3°F | Resp 18 | Ht 65.98 in | Wt 154.0 lb

## 2021-08-20 DIAGNOSIS — E785 Hyperlipidemia, unspecified: Secondary | ICD-10-CM | POA: Diagnosis not present

## 2021-08-20 DIAGNOSIS — R600 Localized edema: Secondary | ICD-10-CM | POA: Diagnosis not present

## 2021-08-20 DIAGNOSIS — I10 Essential (primary) hypertension: Secondary | ICD-10-CM | POA: Diagnosis not present

## 2021-08-20 DIAGNOSIS — M79604 Pain in right leg: Secondary | ICD-10-CM

## 2021-08-20 MED ORDER — SIMVASTATIN 40 MG PO TABS: ORAL_TABLET | ORAL | 0 refills | Status: DC

## 2021-08-20 MED ORDER — HYDROCHLOROTHIAZIDE 25 MG PO TABS: 25.0000 mg | ORAL_TABLET | Freq: Every day | ORAL | 0 refills | Status: DC

## 2021-08-20 MED ORDER — AMLODIPINE BESYLATE 10 MG PO TABS: 10.0000 mg | ORAL_TABLET | Freq: Every day | ORAL | 0 refills | Status: DC

## 2021-08-20 MED ORDER — GABAPENTIN 100 MG PO CAPS: 100.0000 mg | ORAL_CAPSULE | Freq: Every day | ORAL | 0 refills | Status: DC

## 2021-08-20 NOTE — Progress Notes (Signed)
Pt presents for hypertension follow-up, Shamleffer MD advised pt she should have potassium check due to taking Lasix , Request Gabapentin refill

## 2021-08-21 LAB — BASIC METABOLIC PANEL
BUN/Creatinine Ratio: 23 (ref 12–28)
BUN: 20 mg/dL (ref 8–27)
CO2: 28 mmol/L (ref 20–29)
Calcium: 10.1 mg/dL (ref 8.7–10.3)
Chloride: 96 mmol/L (ref 96–106)
Creatinine, Ser: 0.86 mg/dL (ref 0.57–1.00)
Glucose: 86 mg/dL (ref 70–99)
Potassium: 3.2 mmol/L — ABNORMAL LOW (ref 3.5–5.2)
Sodium: 140 mmol/L (ref 134–144)
eGFR: 73 mL/min/{1.73_m2} (ref >59)

## 2021-08-21 NOTE — Progress Notes (Signed)
Kidney function normal.  Potassium remaining lower than normal. Continue prescribed potassium supplement. Stop use of Furosemide (Lasix).   Please call our office and schedule a follow-up appointment with Dorna Mai, MD within 7 days or sooner if needed.

## 2021-08-26 ENCOUNTER — Ambulatory Visit (INDEPENDENT_AMBULATORY_CARE_PROVIDER_SITE_OTHER): Payer: PPO | Admitting: Family Medicine

## 2021-08-26 ENCOUNTER — Other Ambulatory Visit: Payer: Self-pay

## 2021-08-26 VITALS — BP 127/79 | HR 75 | Temp 98.3°F | Resp 18 | Wt 156.2 lb

## 2021-08-26 DIAGNOSIS — I10 Essential (primary) hypertension: Secondary | ICD-10-CM

## 2021-08-26 DIAGNOSIS — E876 Hypokalemia: Secondary | ICD-10-CM

## 2021-08-26 NOTE — Progress Notes (Signed)
Pt presents for potassium recheck after stopping lasix last Thursday

## 2021-08-27 ENCOUNTER — Encounter: Payer: Self-pay | Admitting: Family Medicine

## 2021-08-27 LAB — BASIC METABOLIC PANEL
BUN/Creatinine Ratio: 23 (ref 12–28)
BUN: 21 mg/dL (ref 8–27)
CO2: 21 mmol/L (ref 20–29)
Calcium: 10.1 mg/dL (ref 8.7–10.3)
Chloride: 99 mmol/L (ref 96–106)
Creatinine, Ser: 0.92 mg/dL (ref 0.57–1.00)
Glucose: 91 mg/dL (ref 70–99)
Potassium: 3.8 mmol/L (ref 3.5–5.2)
Sodium: 140 mmol/L (ref 134–144)
eGFR: 67 mL/min/{1.73_m2} (ref >59)

## 2021-08-27 NOTE — Progress Notes (Signed)
LVM for patient to call office back for lab results

## 2021-08-27 NOTE — Progress Notes (Signed)
Established Patient Office Visit  Subjective:  Patient ID: Lori Cook, female    DOB: Sep 26, 1950  Age: 70 y.o. MRN: [id number]  CC:  Chief Complaint  Patient presents with   Follow-up    HPI Lori Cook presents for follow up of low potassium. Patient denies acute complaints.   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     Past Surgical History:  Procedure Laterality Date   ABDOMINAL HYSTERECTOMY     APPENDECTOMY     CHOLECYSTECTOMY     COLONOSCOPY     [facility]- records purges and destroyed- per pt Normal    JOINT REPLACEMENT N/A    [employer]rees[employer] 01/08/2021   SPINE SURGERY     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   Procedure: LEFT TOTAL HIP ARTHROPLASTY ANTERIOR APPROACH;  Surgeon: Leandrew Koyanagi, MD;  Location: [place];  Service: Orthopedics;  Laterality: Left;   TOTAL HIP ARTHROPLASTY Right 06/15/2020   Procedure: RIGHT TOTAL HIP ARTHROPLASTY ANTERIOR APPROACH;  Surgeon: Leandrew Koyanagi, MD;  Location: [place];  Service: Orthopedics;  Laterality: Right;    Family History  Problem Relation Age of Onset   Stroke Father    Diabetes Father    Heart disease Father 54       AMI   Hypertension Sister    Arthritis Brother    Mental illness Mother    Heart disease Paternal Uncle    Colon cancer Neg Hx    Colon polyps Neg Hx    Esophageal cancer Neg Hx    Rectal cancer Neg Hx    Stomach cancer Neg Hx     Social History   Socioeconomic History   Marital status: Single    Spouse name: Not on file   Number of children: Not on file   Years of education: Not on file   Highest education level: Not on  file  Occupational History   Not on file  Tobacco Use   Smoking status: Former    Packs/day: 1.50    Years: 39.00    Pack years: 58.50    Types: Cigarettes    Start date: 76    Quit date: 09/08/2010    Years since quitting: 10.9   Smokeless tobacco: Never  Vaping Use   Vaping Use: Never used  Substance and Sexual Activity   Alcohol use: No   Drug use: No   Sexual activity: Not on file  Other Topics Concern   Not on file  Social History Narrative   Marital status: single; not dating; not interested in 2018     Children: 2 daughters, 1 son passed away 63.5 years old.  Two grandchildren (20,8)      Lives: with daughter, grandson.      Employment:  Education administrator business full time; one employee      Tobacco: quit smoking      Alcohol:  None      Exercise: no formal exercise      ADLs: independent with ADLs; drives; no Environmental consultant  devices.       Advanced Directives: none in 2018.  FULL CODE; no prolonged measures. HCPOA: daughters   Social Determinants of Radio broadcast assistant Strain: Not on file  Food Insecurity: Not on file  Transportation Needs: Not on file  Physical Activity: Not on file  Stress: Not on file  Social Connections: Not on file  Intimate Partner Violence: Not on file    ROS Review of Systems  All other systems reviewed and are negative.  Objective:   Today's Vitals: BP 127/79 (BP Location: Left Arm, Patient Position: Sitting, Cuff Size: Normal)    Pulse 75    Temp 98.3 F (36.8 C)    Resp 18    Wt 156 lb 3.2 oz (70.9 kg)    SpO2 96%    BMI 25.22 kg/m   Physical Exam Vitals and nursing note reviewed.  Constitutional:      General: She is not in acute distress. Cardiovascular:     Rate and Rhythm: Normal rate and regular rhythm.  Pulmonary:     Effort: Pulmonary effort is normal.     Breath sounds: Normal breath sounds.  Abdominal:     Palpations: Abdomen is soft.     Tenderness: There is no abdominal tenderness.  Musculoskeletal:     Right  lower leg: No edema.     Left lower leg: No edema.  Neurological:     General: No focal deficit present.     Mental Status: She is alert and oriented to person, place, and time.    Assessment & Plan:   1. Hypokalemia Monitoring labs ordered.   2. Essential hypertension Appears stable. Monitoring labs ordered.  - Basic metabolic panel  Outpatient Encounter Medications as of 08/26/2021  Medication Sig   acetaminophen (TYLENOL) 500 MG tablet Take 1,000 mg by mouth every 6 (six) hours as needed for moderate pain.    alendronate (FOSAMAX) 70 MG tablet Take 1 tablet (70 mg total) by mouth once a week. Take with a full glass of water on an empty stomach.   amLODipine (NORVASC) 10 MG tablet Take 1 tablet (10 mg total) by mouth daily.   aspirin EC 81 MG tablet Take 81 mg by mouth daily. Swallow whole.   cetirizine (ZYRTEC ALLERGY) 10 MG tablet Take 1 tablet (10 mg total) by mouth daily.   Cholecalciferol (VITAMIN D3 PO) Take by mouth.   fluticasone (FLONASE) 50 MCG/ACT nasal spray Place 2 sprays into both nostrils daily.   furosemide (LASIX) 20 MG tablet Take 1 tablet by mouth once daily   gabapentin (NEURONTIN) 100 MG capsule Take 1 capsule (100 mg total) by mouth at bedtime.   hydrochlorothiazide (HYDRODIURIL) 25 MG tablet Take 1 tablet (25 mg total) by mouth daily.   naproxen sodium (ALEVE) 220 MG tablet Take 440 mg by mouth 2 (two) times daily as needed (pain).   omeprazole (PRILOSEC) 40 MG capsule Take one capsule by mouth once daily 60 minutes before a meal for 6 weeks. Please let Dr. Havery Moros know if you decide to start this medication.   Potassium Chloride ER 20 MEQ TBCR Take 20 mEq by mouth daily in the afternoon.   simvastatin (ZOCOR) 40 MG tablet Take 1 tablet by mouth once daily with breakfast   No facility-administered encounter medications on file as of 08/26/2021.    Follow-up: No follow-ups on file.   Becky Sax, MD

## 2021-08-30 ENCOUNTER — Ambulatory Visit: Payer: PPO | Admitting: Family

## 2021-08-30 ENCOUNTER — Encounter: Payer: Self-pay | Admitting: Gastroenterology

## 2021-09-09 ENCOUNTER — Other Ambulatory Visit: Payer: Self-pay | Admitting: *Deleted

## 2021-09-09 DIAGNOSIS — M79604 Pain in right leg: Secondary | ICD-10-CM

## 2021-09-24 ENCOUNTER — Encounter: Payer: Self-pay | Admitting: Vascular Surgery

## 2021-09-24 ENCOUNTER — Ambulatory Visit: Payer: PPO | Admitting: Vascular Surgery

## 2021-09-24 ENCOUNTER — Other Ambulatory Visit: Payer: Self-pay

## 2021-09-24 ENCOUNTER — Ambulatory Visit (HOSPITAL_COMMUNITY)
Admission: RE | Admit: 2021-09-24 | Discharge: 2021-09-24 | Disposition: A | Discharge status: Home / Self Care | Payer: PPO | Source: Ambulatory Visit | Attending: Vascular Surgery | Admitting: Vascular Surgery

## 2021-09-24 VITALS — BP 134/75 | HR 73 | Temp 98.2°F | Resp 14 | Ht 66.0 in | Wt 155.0 lb

## 2021-09-24 DIAGNOSIS — M79604 Pain in right leg: Secondary | ICD-10-CM | POA: Diagnosis present

## 2021-09-24 DIAGNOSIS — I872 Venous insufficiency (chronic) (peripheral): Secondary | ICD-10-CM | POA: Diagnosis not present

## 2021-09-24 NOTE — Progress Notes (Signed)
Office Note     CC: Bilateral lower extremity swelling, heaviness. Requesting Provider:  Camillia Herter, NP  HPI: Stamatia Masri is a 71 y.o. ([birth year]) female who presents at the request of Dorna Mai, MD for evaluation of bilateral lower extremity swelling.  Patient presents today on exam today, but he was doing well.  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  She is on her feet for most of the day, and appreciates heaviness and tired feeling by days end.  There is also lower extremity edema associated. No wounds, no ulcerations, no skin changes. Kayson is also appreciated some telangiectasias.  Symptoms above have improved with the use of compression stockings, which but [place] wears intermittently.  She has no history of DVT, no previous venous procedures.  No symptoms of claudication, rest pain, tissue loss.  The pt  on a statin for cholesterol management.  The pt  on a daily aspirin.   Other AC:  - The pt is on medications for hypertension.   The pt is not diabetic.   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     Past Surgical History:  Procedure Laterality Date   ABDOMINAL HYSTERECTOMY     APPENDECTOMY     CHOLECYSTECTOMY     COLONOSCOPY     [facility]- records purges and destroyed- per pt Normal    JOINT REPLACEMENT N/A    [employer]rees[employer] 01/08/2021   SPINE SURGERY     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   Procedure: LEFT TOTAL HIP ARTHROPLASTY ANTERIOR APPROACH;  Surgeon: Leandrew Koyanagi, MD;  Location: [place];   Service: Orthopedics;  Laterality: Left;   TOTAL HIP ARTHROPLASTY Right 06/15/2020   Procedure: RIGHT TOTAL HIP ARTHROPLASTY ANTERIOR APPROACH;  Surgeon: Leandrew Koyanagi, MD;  Location: [place];  Service: Orthopedics;  Laterality: Right;    Social History   Socioeconomic History   Marital status: Single    Spouse name: Not on file   Number of children: Not on file   Years of education: Not on file   Highest education level: Not on file  Occupational History   Not on file  Tobacco Use   Smoking status: Former    Packs/day: 1.50    Years: 39.00    Pack years: 58.50    Types: Cigarettes    Start date: 62    Quit date: 09/08/2010    Years since quitting: 11.0   Smokeless tobacco: Never  Vaping Use   Vaping Use: Never used  Substance and Sexual Activity   Alcohol use: No   Drug use: No   Sexual activity: Not on file  Other Topics Concern   Not on file  Social History Narrative   Marital status: single; not dating; not interested in 2018     Children: 2 daughters, 1 son passed away 22.61 years old.  Two grandchildren (20,8)      Lives:  with daughter, grandson.      Employment:  Education administrator business full time; one employee      Tobacco: quit smoking      Alcohol:  None      Exercise: no formal exercise      ADLs: independent with ADLs; drives; no assistant devices.       Advanced Directives: none in 2018.  FULL CODE; no prolonged measures. HCPOA: daughters   Social Determinants of Radio broadcast assistant Strain: Not on file  Food Insecurity: Not on file  Transportation Needs: Not on file  Physical Activity: Not on file  Stress: Not on file  Social Connections: Not on file  Intimate Partner Violence: Not on file    Family History  Problem Relation Age of Onset   Stroke Father    Diabetes Father    Heart disease Father 13       AMI   Hypertension Sister    Arthritis Brother    Mental illness Mother    Heart disease Paternal Uncle    Colon cancer Neg Hx    Colon  polyps Neg Hx    Esophageal cancer Neg Hx    Rectal cancer Neg Hx    Stomach cancer Neg Hx     Current Outpatient Medications  Medication Sig Dispense Refill   acetaminophen (TYLENOL) 500 MG tablet Take 1,000 mg by mouth every 6 (six) hours as needed for moderate pain.      alendronate (FOSAMAX) 70 MG tablet Take 1 tablet (70 mg total) by mouth once a week. Take with a full glass of water on an empty stomach. 12 tablet 3   amLODipine (NORVASC) 10 MG tablet Take 1 tablet (10 mg total) by mouth daily. 90 tablet 0   aspirin EC 81 MG tablet Take 81 mg by mouth daily. Swallow whole.     Cholecalciferol (VITAMIN D3 PO) Take by mouth.     fluticasone (FLONASE) 50 MCG/ACT nasal spray Place 2 sprays into both nostrils daily. 1 g 0   gabapentin (NEURONTIN) 100 MG capsule Take 1 capsule (100 mg total) by mouth at bedtime. 90 capsule 0   hydrochlorothiazide (HYDRODIURIL) 25 MG tablet Take 1 tablet (25 mg total) by mouth daily. 90 tablet 0   naproxen sodium (ALEVE) 220 MG tablet Take 440 mg by mouth 2 (two) times daily as needed (pain).     omeprazole (PRILOSEC) 40 MG capsule Take one capsule by mouth once daily 60 minutes before a meal for 6 weeks. Please let Dr. Havery Moros know if you decide to start this medication. 45 capsule 0   Potassium Chloride ER 20 MEQ TBCR Take 20 mEq by mouth daily in the afternoon. 90 tablet 0   simvastatin (ZOCOR) 40 MG tablet Take 1 tablet by mouth once daily with breakfast 90 tablet 0   cetirizine (ZYRTEC ALLERGY) 10 MG tablet Take 1 tablet (10 mg total) by mouth daily. 30 tablet 0   furosemide (LASIX) 20 MG tablet Take 1 tablet by mouth once daily 30 tablet 0   No current facility-administered medications for this visit.    Allergies  Allergen Reactions   Other    Tramadol Nausea And Vomiting   Codeine Other (See Comments)    Headache   Oxycodone Palpitations     REVIEW OF SYSTEMS:   [X]  denotes positive finding, [ ]  denotes negative finding Cardiac   Comments:  Chest pain or chest pressure:    Shortness of breath upon exertion:  Short of breath when lying flat:    Irregular heart rhythm:        Vascular    Pain in calf, thigh, or hip brought on by ambulation:    Pain in feet at night that wakes you up from your sleep:     Blood clot in your veins:    Leg swelling:         Pulmonary    Oxygen at home:    Productive cough:     Wheezing:         Neurologic    Sudden weakness in arms or legs:     Sudden numbness in arms or legs:     Sudden onset of difficulty speaking or slurred speech:    Temporary loss of vision in one eye:     Problems with dizziness:         Gastrointestinal    Blood in stool:     Vomited blood:         Genitourinary    Burning when urinating:     Blood in urine:        Psychiatric    Major depression:         Hematologic    Bleeding problems:    Problems with blood clotting too easily:        Skin    Rashes or ulcers:        Constitutional    Fever or chills:      PHYSICAL EXAMINATION:  Vitals:   09/24/21 1427  BP: 134/75  Pulse: 73  Resp: 14  Temp: 98.2 F (36.8 C)  TempSrc: Temporal  SpO2: 97%  Weight: 155 lb (70.3 kg)  Height: 5\' 6"  (1.676 m)    General:  WDWN in NAD; vital signs documented above Gait: Not observed HENT: WNL, normocephalic Pulmonary: normal non-labored breathing , without Rales, rhonchi,  wheezing Cardiac: regular HR, Abdomen: soft, NT, no masses Skin: without rashes Vascular Exam/Pulses:  Right Left  Radial 2+ (normal) 2+ (normal)  Ulnar 2+ (normal) 2+ (normal)  Femoral    Popliteal    DP 2+ (normal) 2+ (normal)  PT 2+ (normal) 2+ (normal)   Extremities: without ischemic changes, without Gangrene , without cellulitis; without open wounds;  Musculoskeletal: no muscle wasting or atrophy  Neurologic: A&O X 3;  No focal weakness or paresthesias are detected Psychiatric:  The pt has Normal affect.   Non-Invasive Vascular Imaging:   Venous  Reflux Times  +--------------+---------+------+-----------+------------+--------+   RIGHT          Reflux No Reflux Reflux Time Diameter cms Comments                              Yes                                       +--------------+---------+------+-----------+------------+--------+   CFV            no                                                   +--------------+---------+------+-----------+------------+--------+   FV prox        no                                                   +--------------+---------+------+-----------+------------+--------+  FV mid         no                                                   +--------------+---------+------+-----------+------------+--------+   FV dist        no                                                   +--------------+---------+------+-----------+------------+--------+   Popliteal      no                                                   +--------------+---------+------+-----------+------------+--------+   GSV at SFJ                yes     >500 ms       0.47                +--------------+---------+------+-----------+------------+--------+   GSV prox thigh            yes     >500 ms      0.366                +--------------+---------+------+-----------+------------+--------+   GSV mid thigh  no                              0.281                +--------------+---------+------+-----------+------------+--------+   GSV dist thigh no                              0.361                +--------------+---------+------+-----------+------------+--------+   GSV at knee    no                              0.308                +--------------+---------+------+-----------+------------+--------+   GSV prox calf             yes     >500 ms      0.282                +--------------+---------+------+-----------+------------+--------+   SSV Pop Fossa  no                              0.187                 +--------------+---------+------+-----------+------------+--------+   SSV prox calf  no                               0.21                +--------------+---------+------+-----------+------------+--------+   SSV mid calf  no                              0.205                +--------------+---------+------+-----------+------------+--------+     ASSESSMENT/PLAN:: 71 y.o. female presenting with bilateral lower extremity edema.  This is improved with the use of compression stockings.  Imaging today demonstrates mild venous insufficiency in the right leg with reflux appreciated within the greater saphenous vein in the thigh and calf.  Current symptoms are mild with a CEAP score of 1. I had a long discussion with Aleesa regarding medical management versus surgery.  Tinsley was not interested in surgery at this time.  Being the compression stockings relieve most of Nakyiah's edema and heaviness, I suggested she continue wearing these.  While in the office, I had her measured to ensure appropriate fit moving forward.  Endia was asked to call my office should any questions or concerns arise. She is asked to call my office immediately should wounds arise on her leg, or compression stockings stop working.   Broadus John, MD [facility] [phone]

## 2021-10-09 ENCOUNTER — Encounter: Payer: Self-pay | Admitting: Gastroenterology

## 2021-10-15 ENCOUNTER — Other Ambulatory Visit: Payer: Self-pay

## 2021-10-15 DIAGNOSIS — E785 Hyperlipidemia, unspecified: Secondary | ICD-10-CM

## 2021-10-15 MED ORDER — SIMVASTATIN 40 MG PO TABS: ORAL_TABLET | ORAL | 0 refills | Status: DC

## 2021-11-01 ENCOUNTER — Other Ambulatory Visit: Payer: Self-pay | Admitting: Family

## 2021-11-01 DIAGNOSIS — M79604 Pain in right leg: Secondary | ICD-10-CM

## 2021-11-04 IMAGING — CR DG CHEST 2V
2 series · 2 of 2 positions shown · non-contrast
Comparison: 11/21/2019

CLINICAL DATA: Preoperative evaluation for RIGHT total hip
arthroplasty on 06/15/2020, history GERD, hypertension, stroke,
former smoker

EXAM:
CHEST - 2 VIEW

[w chest pa]
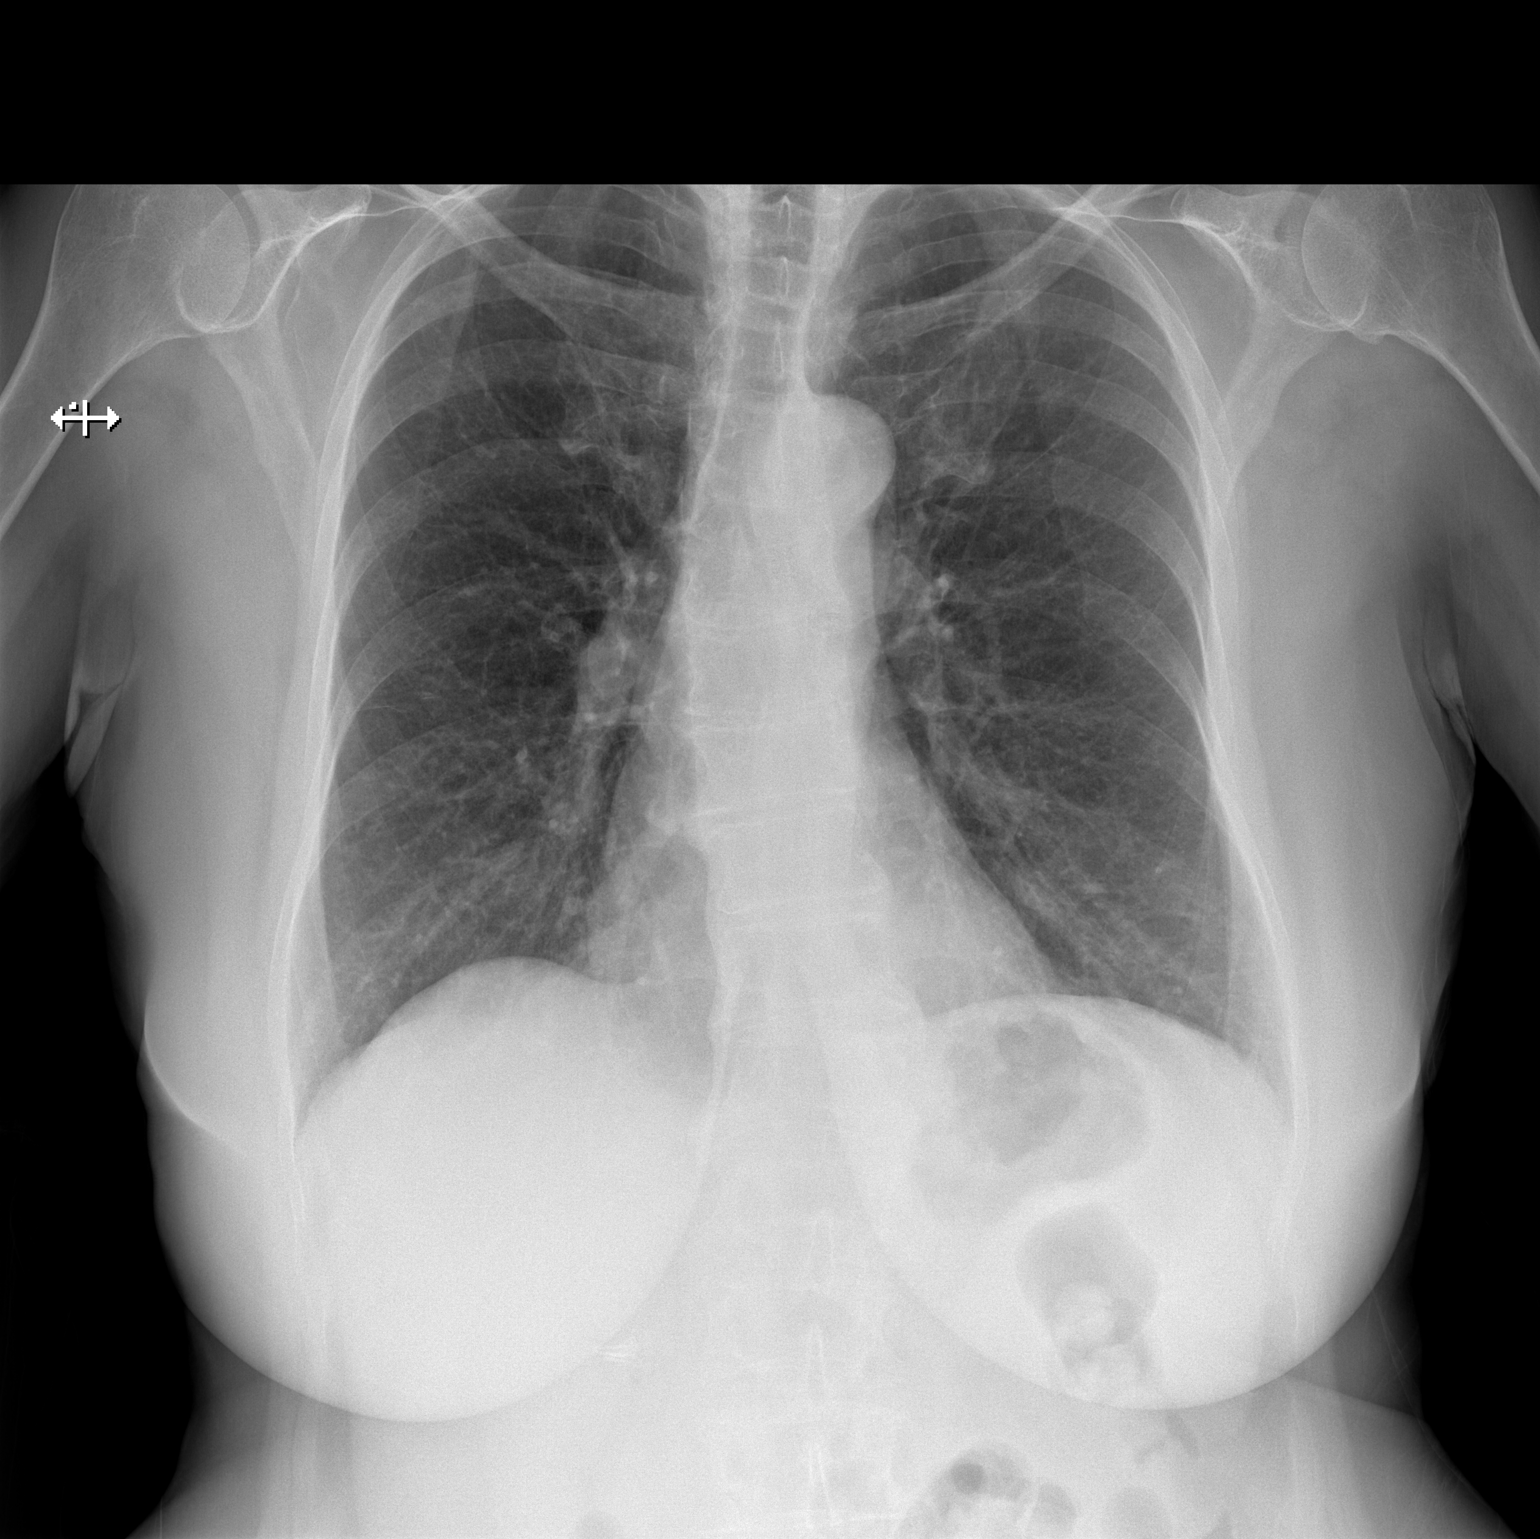

[w chest lat]
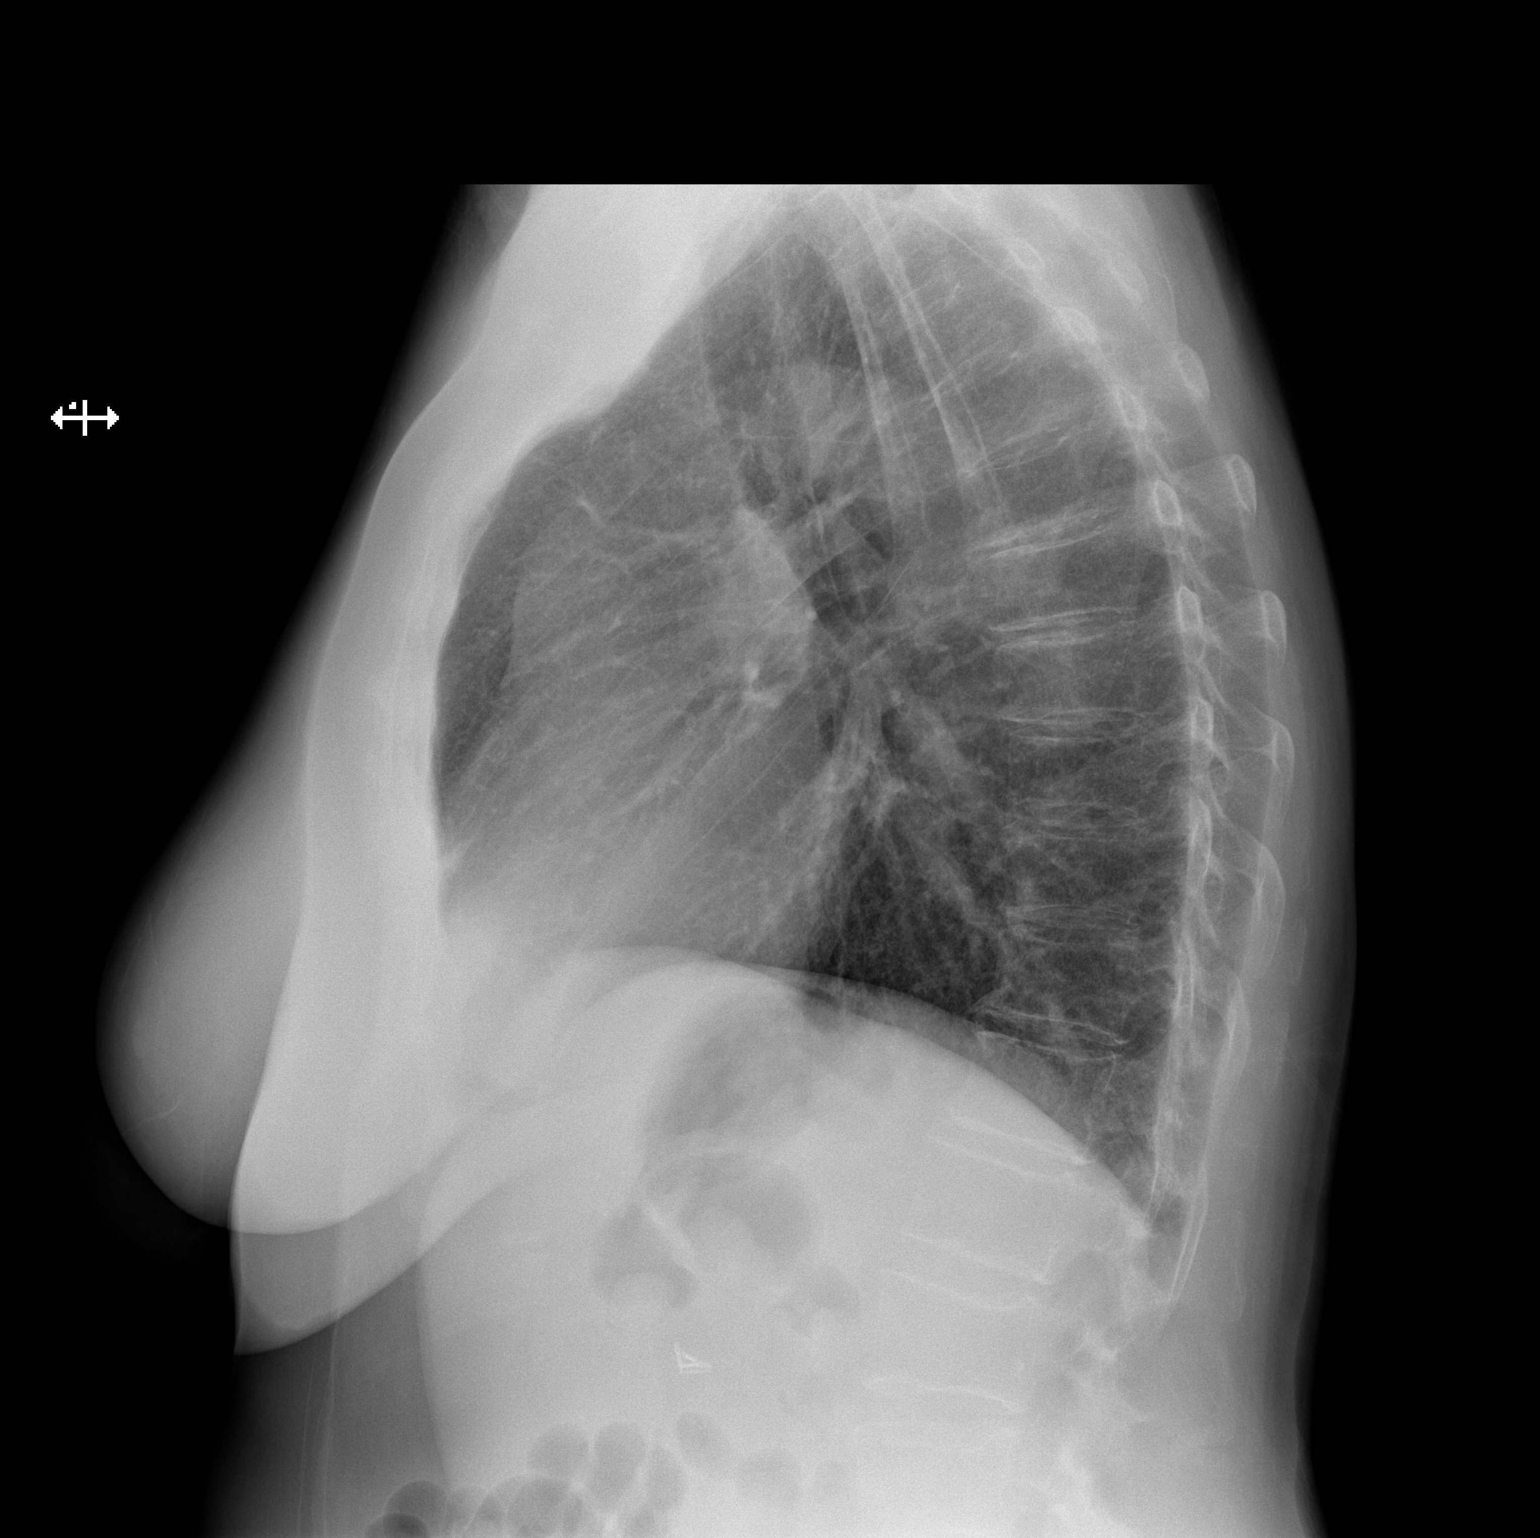

[2 of 2 positions shown; findings below may reference images not displayed]

FINDINGS: Normal heart size, mediastinal contours, and pulmonary vascularity.

Lungs clear.

No pulmonary infiltrate, pleural effusion, or pneumothorax.

Biconvex thoracolumbar scoliosis.
IMPRESSION: No acute abnormalities.

## 2021-11-09 ENCOUNTER — Other Ambulatory Visit: Payer: Self-pay

## 2021-11-09 MED ORDER — OMEPRAZOLE 40 MG PO CPDR: DELAYED_RELEASE_CAPSULE | ORAL | 2 refills | Status: DC

## 2021-11-09 NOTE — Progress Notes (Signed)
Refill request for omeprazole 40 mg once dialy

## 2021-11-18 ENCOUNTER — Ambulatory Visit: Payer: PPO | Admitting: Family Medicine

## 2021-11-19 ENCOUNTER — Ambulatory Visit (INDEPENDENT_AMBULATORY_CARE_PROVIDER_SITE_OTHER): Payer: PPO | Admitting: Family Medicine

## 2021-11-19 ENCOUNTER — Other Ambulatory Visit: Payer: Self-pay

## 2021-11-19 ENCOUNTER — Encounter: Payer: Self-pay | Admitting: Family Medicine

## 2021-11-19 VITALS — BP 132/73 | HR 69 | Temp 98.1°F | Resp 16 | Wt 161.8 lb

## 2021-11-19 DIAGNOSIS — E876 Hypokalemia: Secondary | ICD-10-CM | POA: Diagnosis not present

## 2021-11-19 DIAGNOSIS — I10 Essential (primary) hypertension: Secondary | ICD-10-CM | POA: Diagnosis not present

## 2021-11-19 DIAGNOSIS — E785 Hyperlipidemia, unspecified: Secondary | ICD-10-CM | POA: Diagnosis not present

## 2021-11-19 NOTE — Progress Notes (Signed)
Patient is her for her 3 month follow-up HTN, HDL ? ?Patient is concern about her K+, should she continue medication ? ?Patient is also concern about her right arm pain that has been giving her trouble for weeks. ?

## 2021-11-19 NOTE — Progress Notes (Signed)
? ?Established Patient Office Visit ? ?Subjective:  ?Patient ID: Lori Cook, female    DOB: 09-01-1951  Age: 71 y.o. MRN: [id number] ? ?CC:  ?Chief Complaint  ?Patient presents with  ? Follow-up  ? Hypertension  ? Hyperlipidemia  ?  3 months  ? ? ?HPI ?Lori Cook presents for follow up of chronic med issues including hypertension. She reports that she has stopped taking potassium as she is out and wants to stop because she is no longer taking lasix.  ? ?Past Medical History:  ?Diagnosis Date  ? Allergy   ? Arthritis   ? Blood transfusion without reported diagnosis   ? Cataract   ? Cholelithiasis   ? large stone detected by CT 01/2016  ? GERD (gastroesophageal reflux disease)   ? Hyperlipidemia   ? Hypertension   ? Nephrolithiasis   ? 20m kidney stone L by CT 01/2016  ? Osteoporosis   ? Retinal vein occlusion, 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  ? Stroke ([facility]   ? in retina only   ? ? ?Past Surgical History:  ?Procedure Laterality Date  ? ABDOMINAL HYSTERECTOMY    ? APPENDECTOMY    ? CHOLECYSTECTOMY    ? COLONOSCOPY    ? [facility]- records purges and destroyed- per pt Normal   ? JOINT REPLACEMENT N/A   ? [employer] 01/08/2021  ? SPINE SURGERY    ? 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  ? Procedure: LEFT TOTAL HIP ARTHROPLASTY ANTERIOR APPROACH;  Surgeon: XLeandrew Koyanagi MD;  [place]  Service: Orthopedics;  Laterality: Left;  ? TOTAL HIP ARTHROPLASTY Right 06/15/2020  ? Procedure: RIGHT TOTAL HIP ARTHROPLASTY ANTERIOR APPROACH;  Surgeon: XLeandrew Koyanagi MD;  [place]  Service: Orthopedics;  Laterality: Right;  ? ? ?Family History  ?Problem Relation Age of Onset  ? Stroke Father   ? Diabetes Father   ? Heart disease Father 524 ?     AMI  ? Hypertension Sister   ? Arthritis Brother   ? Mental illness Mother   ? Heart disease Paternal Uncle   ? Colon cancer Neg Hx   ? Colon polyps Neg Hx   ? Esophageal cancer Neg Hx   ? Rectal cancer Neg Hx   ? Stomach cancer Neg Hx   ? ? ?Social History  ? ?Socioeconomic  History  ? Marital status: Single  ?  Spouse name: Not on file  ? Number of children: Not on file  ? Years of education: Not on file  ? Highest education level: Not on file  ?Occupational History  ? Not on file  ?Tobacco Use  ? Smoking status: Former  ?  Packs/day: 1.50  ?  Years: 39.00  ?  Pack years: 58.50  ?  Types: Cigarettes  ?  Start date: 166 ?  Quit date: 09/08/2010  ?  Years since quitting: 11.2  ? Smokeless tobacco: Never  ?Vaping Use  ? Vaping Use: Never used  ?Substance and Sexual Activity  ? Alcohol use: No  ? Drug use: No  ? Sexual activity: Not on file  ?Other Topics Concern  ? Not on file  ?Social History Narrative  ? Marital status: single; not dating; not interested in 2018  ?   Children: 2 daughters, 1 son passed away 319575years old.  Two grandchildren (20,8)  ?    Lives: with daughter, grandson.  ?    Employment:  CEducation administratorbusiness full time; one employee  ?  Tobacco: quit smoking  ?    Alcohol:  None  ?    Exercise: no formal exercise  ?    ADLs: independent with ADLs; drives; no assistant devices.  ?     Advanced Directives: none in 2018.  FULL CODE; no prolonged measures. HCPOA: daughters  ? ?Social Determinants of Health  ? ?Financial Resource Strain: Not on file  ?Food Insecurity: Not on file  ?Transportation Needs: Not on file  ?Physical Activity: Not on file  ?Stress: Not on file  ?Social Connections: Not on file  ?Intimate Partner Violence: Not on file  ? ? ?ROS ?Review of Systems  ?All other systems reviewed and are negative. ? ?Objective:  ? ?Today's Vitals: BP 132/73   Pulse 69   Temp 98.1 ?F (36.7 ?C) (Oral)   Resp 16   Wt 161 lb 12.8 oz (73.4 kg)   SpO2 95%   BMI 26.12 kg/m?  ? ?Physical Exam ?Vitals and nursing note reviewed.  ?Constitutional:   ?   General: She is not in acute distress. ?Cardiovascular:  ?   Rate and Rhythm: Normal rate and regular rhythm.  ?Pulmonary:  ?   Effort: Pulmonary effort is normal.  ?   Breath sounds: Normal breath sounds.  ?Abdominal:  ?    Palpations: Abdomen is soft.  ?   Tenderness: There is no abdominal tenderness.  ?Musculoskeletal:  ?   Right lower leg: No edema.  ?   Left lower leg: No edema.  ?Neurological:  ?   General: No focal deficit present.  ?   Mental Status: She is alert and oriented to person, place, and time.  ? ? ?Assessment & Plan:  ? ?1. Essential hypertension ?Appears stable with present management. Continue and monitor ? ?2. Hyperlipidemia, unspecified hyperlipidemia type ?Continue present management ? ?3. Hypokalemia ?Appears resolved with d/c of lasix ? ? ? ?Outpatient Encounter Medications as of 11/19/2021  ?Medication Sig  ? acetaminophen (TYLENOL) 500 MG tablet Take 1,000 mg by mouth every 6 (six) hours as needed for moderate pain.   ? alendronate (FOSAMAX) 70 MG tablet Take 1 tablet (70 mg total) by mouth once a week. Take with a full glass of water on an empty stomach.  ? aspirin EC 81 MG tablet Take 81 mg by mouth daily. Swallow whole.  ? cetirizine (ZYRTEC ALLERGY) 10 MG tablet Take 1 tablet (10 mg total) by mouth daily.  ? Cholecalciferol (VITAMIN D3 PO) Take by mouth.  ? fluticasone (FLONASE) 50 MCG/ACT nasal spray Place 2 sprays into both nostrils daily.  ? gabapentin (NEURONTIN) 100 MG capsule Take 1 capsule by mouth at bedtime  ? naproxen sodium (ALEVE) 220 MG tablet Take 440 mg by mouth 2 (two) times daily as needed (pain).  ? omeprazole (PRILOSEC) 40 MG capsule Take one capsule by mouth once daily, 60 minutes before a meal  ? Potassium Chloride ER 20 MEQ TBCR Take 20 mEq by mouth daily in the afternoon.  ? potassium chloride SA (KLOR-CON M) 20 MEQ tablet Take 20 mEq by mouth daily.  ? simvastatin (ZOCOR) 40 MG tablet Take 1 tablet by mouth once daily with breakfast  ? amLODipine (NORVASC) 10 MG tablet Take 1 tablet (10 mg total) by mouth daily.  ? furosemide (LASIX) 20 MG tablet Take 1 tablet by mouth once daily  ? hydrochlorothiazide (HYDRODIURIL) 25 MG tablet Take 1 tablet (25 mg total) by mouth daily.  ? ?No  facility-administered encounter medications on file as of 11/19/2021.  ? ? ?  Follow-up: No follow-ups on file.  ? ?Becky Sax, MD ? ?

## 2021-11-22 ENCOUNTER — Encounter: Payer: Self-pay | Admitting: Family Medicine

## 2021-12-24 ENCOUNTER — Ambulatory Visit
Admission: RE | Admit: 2021-12-24 | Discharge: 2021-12-24 | Disposition: A | Discharge status: Home / Self Care | Payer: PPO | Source: Ambulatory Visit | Attending: Internal Medicine | Admitting: Internal Medicine

## 2021-12-24 DIAGNOSIS — M810 Age-related osteoporosis without current pathological fracture: Secondary | ICD-10-CM

## 2022-02-21 ENCOUNTER — Other Ambulatory Visit: Payer: Self-pay | Admitting: Family Medicine

## 2022-02-21 DIAGNOSIS — M79604 Pain in right leg: Secondary | ICD-10-CM

## 2022-02-21 NOTE — Telephone Encounter (Signed)
Refilled per patient request. 

## 2022-03-04 ENCOUNTER — Other Ambulatory Visit: Payer: Self-pay | Admitting: Gastroenterology

## 2022-03-31 ENCOUNTER — Other Ambulatory Visit: Payer: Self-pay | Admitting: Gastroenterology

## 2022-04-09 LAB — HM MAMMOGRAPHY

## 2022-05-13 ENCOUNTER — Other Ambulatory Visit: Payer: Self-pay | Admitting: Family Medicine

## 2022-05-15 ENCOUNTER — Other Ambulatory Visit: Payer: Self-pay | Admitting: Family

## 2022-05-15 DIAGNOSIS — M79604 Pain in right leg: Secondary | ICD-10-CM

## 2022-05-18 NOTE — Telephone Encounter (Signed)
Requested Prescriptions  Pending Prescriptions Disp Refills  . gabapentin (NEURONTIN) 100 MG capsule [Pharmacy Med Name: Gabapentin 100 MG Oral Capsule] 90 capsule 1    Sig: Take 1 capsule by mouth at bedtime     Neurology: Anticonvulsants - gabapentin Passed - 05/15/2022  5:47 PM      Passed - Cr in normal range and within 360 days    Creat  Date Value Ref Range Status  05/07/2016 0.86 0.50 - 0.99 mg/dL Final    Comment:      For patients > or = 70 years of age: The upper reference limit for Creatinine is approximately 13% higher for people identified as African-American.      Creatinine, Ser  Date Value Ref Range Status  08/26/2021 0.92 0.57 - 1.00 mg/dL Final         Passed - Completed PHQ-2 or PHQ-9 in the last 360 days      Passed - Valid encounter within last 12 months    Recent Outpatient Visits          6 months ago Essential hypertension   Primary Care at [facility], MD   8 months ago Hypokalemia   Primary Care at [facility], MD   9 months ago Essential hypertension   Primary Care at [facility] [place], NP   11 months ago Essential hypertension   Primary Care at [facility], [place], NP   1 year ago Medicare annual wellness visit, subsequent   Primary Care at [facility], Flonnie Hailstone, NP      Future Appointments            In 2 days Dorna Mai, MD Primary Care at [facility]   In 1 month Leandrew Koyanagi, MD [facility]

## 2022-05-20 ENCOUNTER — Ambulatory Visit (INDEPENDENT_AMBULATORY_CARE_PROVIDER_SITE_OTHER): Payer: PPO | Admitting: Family Medicine

## 2022-05-20 ENCOUNTER — Encounter: Payer: Self-pay | Admitting: Family Medicine

## 2022-05-20 VITALS — BP 146/76 | HR 69 | Temp 98.0°F | Resp 16 | Wt 161.4 lb

## 2022-05-20 DIAGNOSIS — I10 Essential (primary) hypertension: Secondary | ICD-10-CM | POA: Diagnosis not present

## 2022-05-20 DIAGNOSIS — E785 Hyperlipidemia, unspecified: Secondary | ICD-10-CM | POA: Diagnosis not present

## 2022-05-20 MED ORDER — HYDROCHLOROTHIAZIDE 25 MG PO TABS: 25.0000 mg | ORAL_TABLET | Freq: Every day | ORAL | 1 refills | Status: DC

## 2022-05-20 MED ORDER — AMLODIPINE BESYLATE 10 MG PO TABS: 10.0000 mg | ORAL_TABLET | Freq: Every day | ORAL | 1 refills | Status: DC

## 2022-05-20 MED ORDER — SIMVASTATIN 40 MG PO TABS: ORAL_TABLET | ORAL | 1 refills | Status: DC

## 2022-05-20 NOTE — Progress Notes (Signed)
Patient is here for hypertension follow-up.

## 2022-05-20 NOTE — Progress Notes (Signed)
Established Patient Office Visit  Subjective    Patient ID: Lori Cook, female    DOB: 02-25-1951  Age: 71 y.o. MRN: [id number]  CC:  Chief Complaint  Patient presents with   Hypertension    HPI Lori Cook presents for routine follow up of hypertension. Patient denies acute complaints or concerns.    Outpatient Encounter Medications as of 05/20/2022  Medication Sig   acetaminophen (TYLENOL) 500 MG tablet Take 1,000 mg by mouth every 6 (six) hours as needed for moderate pain.    alendronate (FOSAMAX) 70 MG tablet Take 1 tablet (70 mg total) by mouth once a week. Take with a full glass of water on an empty stomach.   amLODipine (NORVASC) 10 MG tablet Take 1 tablet (10 mg total) by mouth daily.   aspirin EC 81 MG tablet Take 81 mg by mouth daily. Swallow whole.   cetirizine (ZYRTEC ALLERGY) 10 MG tablet Take 1 tablet (10 mg total) by mouth daily.   Cholecalciferol (VITAMIN D3 PO) Take by mouth.   fluticasone (FLONASE) 50 MCG/ACT nasal spray Place 2 sprays into both nostrils daily.   gabapentin (NEURONTIN) 100 MG capsule Take 1 capsule by mouth at bedtime   hydrochlorothiazide (HYDRODIURIL) 25 MG tablet Take 1 tablet (25 mg total) by mouth daily.   naproxen sodium (ALEVE) 220 MG tablet Take 440 mg by mouth 2 (two) times daily as needed (pain).   omeprazole (PRILOSEC) 40 MG capsule TAKE 1 CAPSULE BY MOUTH ONCE DAILY 60  MINUTES  BEFORE  A  MEAL   simvastatin (ZOCOR) 40 MG tablet Take 1 tablet by mouth once daily with breakfast   [DISCONTINUED] amLODipine (NORVASC) 10 MG tablet Take 1 tablet (10 mg total) by mouth daily.   [DISCONTINUED] hydrochlorothiazide (HYDRODIURIL) 25 MG tablet Take 1 tablet (25 mg total) by mouth daily.   [DISCONTINUED] Potassium Chloride ER 20 MEQ TBCR Take 20 mEq by mouth daily in the afternoon.   [DISCONTINUED] potassium chloride SA (KLOR-CON M) 20 MEQ tablet Take 20 mEq by mouth daily.   [DISCONTINUED] simvastatin (ZOCOR) 40 MG tablet Take 1  tablet by mouth once daily with breakfast   No facility-administered encounter medications on file as of 05/20/2022.    Past Medical History:  Diagnosis Date   Allergy    Arthritis    Blood transfusion without reported diagnosis    Cataract    Cholelithiasis    large stone detected by CT 01/2016   GERD (gastroesophageal reflux disease)    Hyperlipidemia    Hypertension    Nephrolithiasis    91m kidney stone L by CT 01/2016   Osteoporosis    Retinal vein occlusion, 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   [facility]     Past Surgical History:  Procedure Laterality Date   ABDOMINAL HYSTERECTOMY     APPENDECTOMY     CHOLECYSTECTOMY     COLONOSCOPY     [facility]- records purges and destroyed- per pt Normal    JOINT REPLACEMENT N/A    [employer] 01/08/2021   SPINE SURGERY     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   Procedure: LEFT TOTAL HIP ARTHROPLASTY ANTERIOR APPROACH;  Surgeon: XLeandrew Koyanagi MD;  [place] [facility]  Service: Orthopedics;  Laterality: Left;   TOTAL HIP ARTHROPLASTY Right 06/15/2020   Procedure: RIGHT TOTAL HIP ARTHROPLASTY ANTERIOR APPROACH;  Surgeon: XLeandrew Koyanagi MD;  [place] [facility]  Service: Orthopedics;  Laterality: Right;    Family  History  Problem Relation Age of Onset   Stroke Father    Diabetes Father    Heart disease Father 46       AMI   Hypertension Sister    Arthritis Brother    Mental illness Mother    Heart disease Paternal Uncle    Colon cancer Neg Hx    Colon polyps Neg Hx    Esophageal cancer Neg Hx    Rectal cancer Neg Hx    Stomach cancer Neg Hx     Social History   Socioeconomic History   Marital status: Single    Spouse name: Not on file   Number of children: Not on file   Years of education: Not on file   Highest education level: Not on file  Occupational History   Not on file  Tobacco Use   Smoking status: Former    Packs/day: 1.50    Years: 39.00    Total pack years: 58.50    Types: Cigarettes    Start  date: 34    Quit date: 09/08/2010    Years since quitting: 11.7   Smokeless tobacco: Never  Vaping Use   Vaping Use: Never used  Substance and Sexual Activity   Alcohol use: No   Drug use: No   Sexual activity: Not on file  Other Topics Concern   Not on file  Social History Narrative   Marital status: single; not dating; not interested in 2018     Children: 2 daughters, 1 son passed away 67.56 years old.  Two grandchildren (20,8)      Lives: with daughter, grandson.      Employment:  Education administrator business full time; one employee      Tobacco: quit smoking      Alcohol:  None      Exercise: no formal exercise      ADLs: independent with ADLs; drives; no assistant devices.       Advanced Directives: none in 2018.  FULL CODE; no prolonged measures. HCPOA: daughters   Social Determinants of Radio broadcast assistant Strain: Not on file  Food Insecurity: Not on file  Transportation Needs: Not on file  Physical Activity: Not on file  Stress: Not on file  Social Connections: Not on file  Intimate Partner Violence: Not on file    Review of Systems  All other systems reviewed and are negative.       Objective    BP (!) 146/76   Pulse 69   Temp 98 F (36.7 C) (Temporal)   Resp 16   Wt 161 lb 6.4 oz (73.2 kg)   SpO2 95%   BMI 26.05 kg/m   Physical Exam Vitals and nursing note reviewed.  Constitutional:      General: She is not in acute distress. Cardiovascular:     Rate and Rhythm: Normal rate and regular rhythm.  Pulmonary:     Effort: Pulmonary effort is normal.     Breath sounds: Normal breath sounds.  Abdominal:     Palpations: Abdomen is soft.     Tenderness: There is no abdominal tenderness.  Musculoskeletal:     Right lower leg: No edema.     Left lower leg: No edema.  Neurological:     General: No focal deficit present.     Mental Status: She is alert and oriented to person, place, and time.         Assessment & Plan:   1. Essential  hypertension  Meds refilled. Continue present management and monitor - hydrochlorothiazide (HYDRODIURIL) 25 MG tablet; Take 1 tablet (25 mg total) by mouth daily.  Dispense: 90 tablet; Refill: 1 - amLODipine (NORVASC) 10 MG tablet; Take 1 tablet (10 mg total) by mouth daily.  Dispense: 90 tablet; Refill: 1  2. Hyperlipidemia, unspecified hyperlipidemia type continue - simvastatin (ZOCOR) 40 MG tablet; Take 1 tablet by mouth once daily with breakfast  Dispense: 90 tablet; Refill: 1    Return in about 6 months (around 11/18/2022) for follow up.   Becky Sax, MD

## 2022-06-23 ENCOUNTER — Other Ambulatory Visit: Payer: Self-pay | Admitting: Gastroenterology

## 2022-06-23 ENCOUNTER — Telehealth: Payer: PPO | Admitting: Physician Assistant

## 2022-06-23 ENCOUNTER — Telehealth: Payer: PPO

## 2022-06-23 DIAGNOSIS — H66001 Acute suppurative otitis media without spontaneous rupture of ear drum, right ear: Secondary | ICD-10-CM | POA: Diagnosis not present

## 2022-06-23 MED ORDER — NEOMYCIN-POLYMYXIN-HC 3.5-10000-1 OT SOLN: 3.0000 [drp] | Freq: Three times a day (TID) | OTIC | 0 refills | Status: DC

## 2022-06-23 MED ORDER — AMOXICILLIN 500 MG PO CAPS: 500.0000 mg | ORAL_CAPSULE | Freq: Two times a day (BID) | ORAL | 0 refills | Status: AC

## 2022-06-23 NOTE — Patient Instructions (Signed)
Damien Fusi, thank you for joining Mar Daring, PA-C for today's virtual visit.  While this provider is not your primary care provider (PCP), if your PCP is located in our provider database this encounter information will be shared with them immediately following your visit.  Consent: (Patient) Lori Cook provided verbal consent for this virtual visit at the beginning of the encounter.  Current Medications:  Current Outpatient Medications:    amoxicillin (AMOXIL) 500 MG capsule, Take 1 capsule (500 mg total) by mouth 2 (two) times daily for 10 days., Disp: 20 capsule, Rfl: 0   neomycin-polymyxin-hydrocortisone (CORTISPORIN) OTIC solution, Place 3 drops into the right ear 3 (three) times daily. X 5-7 days, Disp: 10 mL, Rfl: 0   acetaminophen (TYLENOL) 500 MG tablet, Take 1,000 mg by mouth every 6 (six) hours as needed for moderate pain. , Disp: , Rfl:    alendronate (FOSAMAX) 70 MG tablet, Take 1 tablet (70 mg total) by mouth once a week. Take with a full glass of water on an empty stomach., Disp: 12 tablet, Rfl: 3   amLODipine (NORVASC) 10 MG tablet, Take 1 tablet (10 mg total) by mouth daily., Disp: 90 tablet, Rfl: 1   aspirin EC 81 MG tablet, Take 81 mg by mouth daily. Swallow whole., Disp: , Rfl:    cetirizine (ZYRTEC ALLERGY) 10 MG tablet, Take 1 tablet (10 mg total) by mouth daily., Disp: 30 tablet, Rfl: 0   Cholecalciferol (VITAMIN D3 PO), Take by mouth., Disp: , Rfl:    fluticasone (FLONASE) 50 MCG/ACT nasal spray, Place 2 sprays into both nostrils daily., Disp: 1 g, Rfl: 0   gabapentin (NEURONTIN) 100 MG capsule, Take 1 capsule by mouth at bedtime, Disp: 90 capsule, Rfl: 1   hydrochlorothiazide (HYDRODIURIL) 25 MG tablet, Take 1 tablet (25 mg total) by mouth daily., Disp: 90 tablet, Rfl: 1   naproxen sodium (ALEVE) 220 MG tablet, Take 440 mg by mouth 2 (two) times daily as needed (pain)., Disp: , Rfl:    omeprazole (PRILOSEC) 40 MG capsule, TAKE 1 CAPSULE BY MOUTH  ONCE DAILY 60  MINUTES  BEFORE  A  MEAL, Disp: 30 capsule, Rfl: 2   simvastatin (ZOCOR) 40 MG tablet, Take 1 tablet by mouth once daily with breakfast, Disp: 90 tablet, Rfl: 1   Medications ordered in this encounter:  Meds ordered this encounter  Medications   amoxicillin (AMOXIL) 500 MG capsule    Sig: Take 1 capsule (500 mg total) by mouth 2 (two) times daily for 10 days.    Dispense:  20 capsule    Refill:  0    Order Specific Question:   Supervising Provider    Answer:   Chase Picket [[id number]]   neomycin-polymyxin-hydrocortisone (CORTISPORIN) OTIC solution    Sig: Place 3 drops into the right ear 3 (three) times daily. X 5-7 days    Dispense:  10 mL    Refill:  0    Order Specific Question:   Supervising Provider    Answer:   Chase Picket [[id number]]     *If you need refills on other medications prior to your next appointment, please contact your pharmacy*  Follow-Up: Call back or seek an in-person evaluation if the symptoms worsen or if the condition fails to improve as anticipated.  [place] [phone]  Other Instructions  Otitis Media, Adult  Otitis media is a condition in which the middle ear is red and swollen (inflamed) and full of  fluid. The middle ear is the part of the ear that contains bones for hearing as well as air that helps send sounds to the brain. The condition usually goes away on its own. What are the causes? This condition is caused by a blockage in the eustachian tube. This tube connects the middle ear to the back of the nose. It normally allows air into the middle ear. The blockage is caused by fluid or swelling. Problems that can cause blockage include: A cold or infection that affects the nose, mouth, or throat. Allergies. An irritant, such as tobacco smoke. Adenoids that have become large. The adenoids are soft tissue located in the back of the throat, behind the nose and the roof of the mouth. Growth or swelling in the  upper part of the throat, just behind the nose (nasopharynx). Damage to the ear caused by a change in pressure. This is called barotrauma. What increases the risk? You are more likely to develop this condition if you: Smoke or are exposed to tobacco smoke. Have an opening in the roof of your mouth (cleft palate). Have acid reflux. Have problems in your body's defense system (immune system). What are the signs or symptoms? Symptoms of this condition include: Ear pain. Fever. Problems with hearing. Being tired. Fluid leaking from the ear. Ringing in the ear. How is this treated? This condition can go away on its own within 3-5 days. But if the condition is caused by germs (bacteria) and does not go away on its own, or if it keeps coming back, your doctor may: Give you antibiotic medicines. Give you medicines for pain. Follow these instructions at home: Take over-the-counter and prescription medicines only as told by your doctor. If you were prescribed an antibiotic medicine, take it as told by your doctor. Do not stop taking it even if you start to feel better. Keep all follow-up visits. Contact a doctor if: You have bleeding from your nose. There is a lump on your neck. You are not feeling better in 5 days. You feel worse instead of better. Get help right away if: You have pain that is not helped with medicine. You have swelling, redness, or pain around your ear. You get a stiff neck. You cannot move part of your face (paralysis). You notice that the bone behind your ear hurts when you touch it. You get a very bad headache. Summary Otitis media means that the middle ear is red, swollen, and full of fluid. This condition usually goes away on its own. If the problem does not go away, treatment may be needed. You may be given medicines to treat the infection or to treat your pain. If you were prescribed an antibiotic medicine, take it as told by your doctor. Do not stop taking it  even if you start to feel better. Keep all follow-up visits. This information is not intended to replace advice given to you by your health care provider. Make sure you discuss any questions you have with your health care provider. Document Revised: 12/07/2020 Document Reviewed: 12/07/2020 Elsevier Patient Education  [place].    If you have been instructed to have an in-person evaluation today at a local Urgent Care facility, please use the link below. It will take you to a list of all of our available [facility], including address, phone number and hours of operation. Please do not delay care.  [facility]  If you or a family member do not  have a primary care provider, use the link below to schedule a visit and establish care. When you choose a [facility] primary care physician or advanced practice provider, you gain a long-term partner in health. Find a Primary Care Provider  Learn more about [facility]'s in-office and virtual care options: [facility]

## 2022-06-23 NOTE — Progress Notes (Signed)
Virtual Visit Consent   Lori Cook, you are scheduled for a virtual visit with a [facility] provider today. Just as with appointments in the office, your consent must be obtained to participate. Your consent will be active for this visit and any virtual visit you may have with one of our providers in the next 365 days. If you have a MyChart account, a copy of this consent can be sent to you electronically.  As this is a virtual visit, video technology does not allow for your provider to perform a traditional examination. This may limit your provider's ability to fully assess your condition. If your provider identifies any concerns that need to be evaluated in person or the need to arrange testing (such as labs, EKG, etc.), we will make arrangements to do so. Although advances in technology are sophisticated, we cannot ensure that it will always work on either your end or our end. If the connection with a video visit is poor, the visit may have to be switched to a telephone visit. With either a video or telephone visit, we are not always able to ensure that we have a secure connection.  By engaging in this virtual visit, you consent to the provision of healthcare and authorize for your insurance to be billed (if applicable) for the services provided during this visit. Depending on your insurance coverage, you may receive a charge related to this service.  I need to obtain your verbal consent now. Are you willing to proceed with your visit today? Lori Cook has provided verbal consent on 06/23/2022 for a virtual visit (video or telephone). Mar Daring, PA-C  Date: 06/23/2022 12:06 PM  Virtual Visit via Video Note   I, Mar Daring, connected with  Lori Cook  ([id number], 1951/08/20) on 06/23/22 at 12:00 PM EDT by a video-enabled telemedicine application and verified that I am speaking with the correct person using two identifiers.  Location: Patient: Virtual Visit  Location Patient: Home Provider: Virtual Visit Location Provider: Home Office   I discussed the limitations of evaluation and management by telemedicine and the availability of in person appointments. The patient expressed understanding and agreed to proceed.    History of Present Illness: Lori Cook is a 71 y.o. who identifies as a female who was assigned female at birth, and is being seen today for right ear pain.  HPI: Otalgia  There is pain in the right ear. This is a new problem. The current episode started yesterday. The problem occurs constantly. The problem has been gradually worsening. There has been no fever. The pain is mild. Associated symptoms include headaches (with bending forward). Pertinent negatives include no coughing, ear discharge, hearing loss, rhinorrhea, sore throat or vomiting. She has tried acetaminophen (flonase; h/o ETD dysfunction in right ear) for the symptoms. The treatment provided no relief. Her past medical history is significant for a chronic ear infection. There is no history of hearing loss or a tympanostomy tube.     Problems:  Patient Active Problem List   Diagnosis Date Noted   Prediabetes 02/21/2021   Status post total replacement of right hip 06/15/2020   Primary osteoarthritis of right hip 06/14/2020   Vitamin D insufficiency 01/09/2020   Osteoporosis without current pathological fracture 01/07/2020   Hypercalcemia 01/07/2020   Status post total replacement of left hip 11/25/2019   Calculus of gallbladder without cholecystitis without obstruction 05/07/2016   Nephrolithiasis 05/07/2016   Pulmonary nodules 05/07/2016   Hematuria, microscopic  05/07/2016   Essential hypertension, benign 04/24/2015   Allergic rhinitis due to pollen 04/24/2015   Retinal vein occlusion, branch 04/24/2015   Retinal vein occlusion 03/08/2015   Essential hypertension-new onset 03/08/2015   Hyperlipidemia with target LDL less than 130 09/09/2012    Allergies:   Allergies  Allergen Reactions   Other    Tramadol Nausea And Vomiting   Codeine Other (See Comments)    Headache   Oxycodone Palpitations   Medications:  Current Outpatient Medications:    amoxicillin (AMOXIL) 500 MG capsule, Take 1 capsule (500 mg total) by mouth 2 (two) times daily for 10 days., Disp: 20 capsule, Rfl: 0   neomycin-polymyxin-hydrocortisone (CORTISPORIN) OTIC solution, Place 3 drops into the right ear 3 (three) times daily. X 5-7 days, Disp: 10 mL, Rfl: 0   acetaminophen (TYLENOL) 500 MG tablet, Take 1,000 mg by mouth every 6 (six) hours as needed for moderate pain. , Disp: , Rfl:    alendronate (FOSAMAX) 70 MG tablet, Take 1 tablet (70 mg total) by mouth once a week. Take with a full glass of water on an empty stomach., Disp: 12 tablet, Rfl: 3   amLODipine (NORVASC) 10 MG tablet, Take 1 tablet (10 mg total) by mouth daily., Disp: 90 tablet, Rfl: 1   aspirin EC 81 MG tablet, Take 81 mg by mouth daily. Swallow whole., Disp: , Rfl:    cetirizine (ZYRTEC ALLERGY) 10 MG tablet, Take 1 tablet (10 mg total) by mouth daily., Disp: 30 tablet, Rfl: 0   Cholecalciferol (VITAMIN D3 PO), Take by mouth., Disp: , Rfl:    fluticasone (FLONASE) 50 MCG/ACT nasal spray, Place 2 sprays into both nostrils daily., Disp: 1 g, Rfl: 0   gabapentin (NEURONTIN) 100 MG capsule, Take 1 capsule by mouth at bedtime, Disp: 90 capsule, Rfl: 1   hydrochlorothiazide (HYDRODIURIL) 25 MG tablet, Take 1 tablet (25 mg total) by mouth daily., Disp: 90 tablet, Rfl: 1   naproxen sodium (ALEVE) 220 MG tablet, Take 440 mg by mouth 2 (two) times daily as needed (pain)., Disp: , Rfl:    omeprazole (PRILOSEC) 40 MG capsule, TAKE 1 CAPSULE BY MOUTH ONCE DAILY 60  MINUTES  BEFORE  A  MEAL, Disp: 30 capsule, Rfl: 2   simvastatin (ZOCOR) 40 MG tablet, Take 1 tablet by mouth once daily with breakfast, Disp: 90 tablet, Rfl: 1  Observations/Objective: Patient is well-developed, well-nourished in no acute distress.  Resting  comfortably at home.  Head is normocephalic, atraumatic.  No labored breathing.  Speech is clear and coherent with logical content.  Patient is alert and oriented at baseline.    Assessment and Plan: 1. Non-recurrent acute suppurative otitis media of right ear without spontaneous rupture of tympanic membrane - amoxicillin (AMOXIL) 500 MG capsule; Take 1 capsule (500 mg total) by mouth 2 (two) times daily for 10 days.  Dispense: 20 capsule; Refill: 0 - neomycin-polymyxin-hydrocortisone (CORTISPORIN) OTIC solution; Place 3 drops into the right ear 3 (three) times daily. X 5-7 days  Dispense: 10 mL; Refill: 0  - Worsening symptoms that have not responded to OTC medications.  - Will give Amoxicillin and Cortisporin - Continue Flonase - Steam and humidifier can help - Stay well hydrated and get plenty of rest.  - Seek in person evaluation if no symptom improvement or if symptoms worsen   Follow Up Instructions: I discussed the assessment and treatment plan with the patient. The patient was provided an opportunity to ask questions and all were answered. The  patient agreed with the plan and demonstrated an understanding of the instructions.  A copy of instructions were sent to the patient via MyChart unless otherwise noted below.    The patient was advised to call back or seek an in-person evaluation if the symptoms worsen or if the condition fails to improve as anticipated.  Time:  I spent 10 minutes with the patient via telehealth technology discussing the above problems/concerns.    Mar Daring, PA-C

## 2022-06-28 ENCOUNTER — Encounter: Payer: Self-pay | Admitting: Orthopaedic Surgery

## 2022-06-28 ENCOUNTER — Ambulatory Visit (INDEPENDENT_AMBULATORY_CARE_PROVIDER_SITE_OTHER): Payer: PPO

## 2022-06-28 ENCOUNTER — Ambulatory Visit (INDEPENDENT_AMBULATORY_CARE_PROVIDER_SITE_OTHER): Payer: PPO | Admitting: Orthopaedic Surgery

## 2022-06-28 DIAGNOSIS — Z96642 Presence of left artificial hip joint: Secondary | ICD-10-CM

## 2022-06-28 DIAGNOSIS — Z96641 Presence of right artificial hip joint: Secondary | ICD-10-CM

## 2022-06-28 NOTE — Progress Notes (Signed)
Office Visit Note   Patient: Lori Cook           Date of Birth: 1951/09/04           MRN: [id number] Visit Date: 06/28/2022              Requested by: Camillia Herter, NP [address] [place],  [place] [address] PCP: Dorna Mai, MD   Assessment & Plan: Visit Diagnoses:  1. Status post total replacement of right hip   2. Status post total replacement of left hip     Plan: Overall very pleased with how she has done and she has also been very pleased.  At this point we can discontinue dental prophylaxis.  She has the option of following up with [place] every 2 to 3 years for repeat x-rays if she chooses to do so or she can just follow-up as needed.  I have given her my card as a reminder.  Follow-Up Instructions: No follow-ups on file.   Orders:  Orders Placed This Encounter  Procedures   XR Pelvis 1-2 Views   No orders of the defined types were placed in this encounter.     Procedures: No procedures performed   Clinical Data: No additional findings.   Subjective: Chief Complaint  Patient presents with   Right Hip - Follow-up    Right THA 06/15/2020   Left Hip - Follow-up    Left THA 11/25/2019    HPI Lori Cook is following up for her hip replacements.  She underwent left hip replacement in 11/25/2019 and right total hip replacement on 06/15/2020.  She has done really well from the surgeries and very pleased.  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  Review of Systems   Objective: Vital Signs: There were no vitals taken for this visit.  Physical Exam  Ortho Exam Examination of the hips show fully healed surgical scars.  She has fluid painless range of motion.  Normal gait and ambulation. Specialty Comments:  No specialty comments available.  Imaging: XR Pelvis 1-2 Views  Result Date: 06/28/2022 Stable bilateral total hip replacements without any complications.  No evidence of loosening.  No evidence of poly wear.    PMFS  History: Patient Active Problem List   Diagnosis Date Noted   Prediabetes 02/21/2021   Status post total replacement of right hip 06/15/2020   Primary osteoarthritis of right hip 06/14/2020   Vitamin D insufficiency 01/09/2020   Osteoporosis without current pathological fracture 01/07/2020   Hypercalcemia 01/07/2020   Status post total replacement of left hip 11/25/2019   Calculus of gallbladder without cholecystitis without obstruction 05/07/2016   Nephrolithiasis 05/07/2016   Pulmonary nodules 05/07/2016   Hematuria, microscopic 05/07/2016   Essential hypertension, benign 04/24/2015   Allergic rhinitis due to pollen 04/24/2015   Retinal vein occlusion, branch 04/24/2015   Retinal vein occlusion 03/08/2015   Essential hypertension-new onset 03/08/2015   Hyperlipidemia with target LDL less than 130 09/09/2012   Past Medical History:  Diagnosis Date   Allergy    Arthritis    Blood transfusion without reported diagnosis    Cataract    Cholelithiasis    large stone detected by CT 01/2016   GERD (gastroesophageal reflux disease)    Hyperlipidemia    Hypertension    Nephrolithiasis    28m kidney stone L by CT 01/2016   Osteoporosis    Retinal vein occlusion, 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   [place]     Family History  Problem Relation Age of Onset   Stroke Father    Diabetes Father    Heart disease Father 71       AMI   Hypertension Sister    Arthritis Brother    Mental illness Mother    Heart disease Paternal Uncle    Colon cancer Neg Hx    Colon polyps Neg Hx    Esophageal cancer Neg Hx    Rectal cancer Neg Hx    Stomach cancer Neg Hx     Past Surgical History:  Procedure Laterality Date   ABDOMINAL HYSTERECTOMY     APPENDECTOMY     CHOLECYSTECTOMY     COLONOSCOPY     [facility]- records purges and destroyed- per pt Normal    JOINT REPLACEMENT N/A    Phrees[employer] 01/08/2021   SPINE SURGERY     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   Procedure:  LEFT TOTAL HIP ARTHROPLASTY ANTERIOR APPROACH;  Surgeon: Leandrew Koyanagi, MD;  Location: [facility];  Service: Orthopedics;  Laterality: Left;   TOTAL HIP ARTHROPLASTY Right 06/15/2020   Procedure: RIGHT TOTAL HIP ARTHROPLASTY ANTERIOR APPROACH;  Surgeon: Leandrew Koyanagi, MD;  Location: [facility];  Service: Orthopedics;  Laterality: Right;   Social History   Occupational History   Not on file  Tobacco Use   Smoking status: Former    Packs/day: 1.50    Years: 39.00    Total pack years: 58.50    Types: Cigarettes    Start date: 59    Quit date: 09/08/2010    Years since quitting: 11.8   Smokeless tobacco: Never  Vaping Use   Vaping Use: Never used  Substance and Sexual Activity   Alcohol use: No   Drug use: No   Sexual activity: Not on file

## 2022-08-01 ENCOUNTER — Ambulatory Visit (INDEPENDENT_AMBULATORY_CARE_PROVIDER_SITE_OTHER): Payer: PPO | Admitting: Internal Medicine

## 2022-08-01 ENCOUNTER — Other Ambulatory Visit: Payer: Self-pay | Admitting: *Deleted

## 2022-08-01 ENCOUNTER — Encounter: Payer: Self-pay | Admitting: Internal Medicine

## 2022-08-01 VITALS — BP 110/74 | HR 96 | Ht 66.0 in | Wt 163.0 lb

## 2022-08-01 DIAGNOSIS — E559 Vitamin D deficiency, unspecified: Secondary | ICD-10-CM

## 2022-08-01 DIAGNOSIS — M810 Age-related osteoporosis without current pathological fracture: Secondary | ICD-10-CM

## 2022-08-01 LAB — BASIC METABOLIC PANEL
BUN: 15 mg/dL (ref 6–23)
CO2: 29 mEq/L (ref 19–32)
Calcium: 10.2 mg/dL (ref 8.4–10.5)
Chloride: 101 mEq/L (ref 96–112)
Creatinine, Ser: 0.83 mg/dL (ref 0.40–1.20)
GFR: 71.51 mL/min (ref >60.00)
Glucose, Bld: 112 mg/dL — ABNORMAL HIGH (ref 70–99)
Potassium: 3.6 mEq/L (ref 3.5–5.1)
Sodium: 138 mEq/L (ref 135–145)

## 2022-08-01 LAB — ALBUMIN: Albumin: 4.6 g/dL (ref 3.5–5.2)

## 2022-08-01 LAB — VITAMIN D 25 HYDROXY (VIT D DEFICIENCY, FRACTURES): VITD: 61.48 ng/mL (ref 30.00–100.00)

## 2022-08-01 MED ORDER — ALENDRONATE SODIUM 70 MG PO TABS: 70.0000 mg | ORAL_TABLET | ORAL | 3 refills | Status: DC

## 2022-08-01 NOTE — Patient Instructions (Signed)
-   Stay Hydrated  - Avoid over the counter calcium tablets  - Consume 2-3 servings of calcium in your diet daily  - Continue Vitamin D 2000 iu daily     24-Hour Urine Collection  You will be collecting your urine for a 24-hour period of time. Your timer starts with your first urine of the morning (For example - If you first pee at [place], your timer will start at [place]) [place] away your first urine of the morning Collect your urine every time you pee for the next 24 hours STOP your urine collection 24 hours after you started the collection (For example - You would stop at 9AM the day after you started)

## 2022-08-01 NOTE — Progress Notes (Unsigned)
Name: Lori Cook  MRN/ DOB: [id number], 05-18-1951    Age/ Sex: 71 y.o., female     PCP: Dorna Mai, MD   Reason for Endocrinology Evaluation: Hypercalcemia      Initial Endocrinology Clinic Visit: 01/08/2020    PATIENT IDENTIFIER: Lori Cook is a 71 y.o., female with a past medical history of Osteopenia and dyslipidemia. She has followed with [facility] since 01/08/2020 for consultative assistance with management of her hypercalcemia.   HISTORICAL SUMMARY: The patient was first diagnosed with intermittent hypercalcemia since 2016  with serum calcium os 10.5 mg/dL ( Corrected calcium 9.78) and again in 2019 , serum calcium 10.4 ( corrected 9.84 mg/dL ) and in 10/2019 at 10.8 ( Corrected 10.32).  Her PTH has been fluctuating as high as 75 pg/mL .  She has a hx of renal stones  She has low bone density on DXA ( 10/24/2018)  As well as slip and fall wrist fracture  She was started on Alendronate in 12/2019 due to clinical osteoporosis   DXA 12/24/2021 showed osteoporosis at the distal radius with a T score of -3.0  HCTZ was stopped in 10/2019  24-hr urine Ca/Cr ratio of 0.019 with calcium of 224 mg  SUBJECTIVE:     Today (08/01/2022):  Lori Cook is here for hypercalcemia and osteoporosis.  Takes PPI to control cough due to GERD  Denies polydipsia , no polyuria  Denies constipation  No renal stones    HOME ENDOCRINE MEDICATIONS: Alendronate 70 mg weekly  Vitamin D 2000 iu daily       HISTORY:  Past Medical History:  Past Medical History:  Diagnosis Date   Allergy    Arthritis    Blood transfusion without reported diagnosis    Cataract    Cholelithiasis    large stone detected by CT 01/2016   GERD (gastroesophageal reflux disease)    Hyperlipidemia    Hypertension    Nephrolithiasis    36m kidney stone L by CT 01/2016   Osteoporosis    Retinal vein occlusion, 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   [place]    Past Surgical  History:  Past Surgical History:  Procedure Laterality Date   ABDOMINAL HYSTERECTOMY     APPENDECTOMY     CHOLECYSTECTOMY     COLONOSCOPY     [facility]- records purges and destroyed- per pt Normal    JOINT REPLACEMENT N/A    [employer]reesia 01/08/2021   SPINE SURGERY     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   Procedure: LEFT TOTAL HIP ARTHROPLASTY ANTERIOR APPROACH;  Surgeon: XLeandrew Koyanagi MD;  [facility]  Service: Orthopedics;  Laterality: Left;   TOTAL HIP ARTHROPLASTY Right 06/15/2020   Procedure: RIGHT TOTAL HIP ARTHROPLASTY ANTERIOR APPROACH;  Surgeon: XLeandrew Koyanagi MD;  [place] [facility]  Service: Orthopedics;  Laterality: Right;   Social History:  reports that she quit smoking about 11 years ago. Her smoking use included cigarettes. She started smoking about 51 years ago. She has a 58.50 pack-year smoking history. She has never used smokeless tobacco. She reports that she does not drink alcohol and does not use drugs. Family History:  Family History  Problem Relation Age of Onset   Stroke Father    Diabetes Father    Heart disease Father 550      AMI   Hypertension Sister    Arthritis Brother    Mental illness Mother  Heart disease Paternal Uncle    Colon cancer Neg Hx    Colon polyps Neg Hx    Esophageal cancer Neg Hx    Rectal cancer Neg Hx    Stomach cancer Neg Hx      HOME MEDICATIONS: Allergies as of 08/01/2022       Reactions   Other    Tramadol Nausea And Vomiting   Codeine Other (See Comments)   Headache   Oxycodone Palpitations        Medication List        Accurate as of August 01, 2022  8:02 AM. If you have any questions, ask your nurse or doctor.          STOP taking these medications    neomycin-polymyxin-hydrocortisone OTIC solution Commonly known as: CORTISPORIN Stopped by: Dorita Sciara, MD       TAKE these medications    acetaminophen 500 MG tablet Commonly known as: TYLENOL Take 1,000 mg by mouth  every 6 (six) hours as needed for moderate pain.   alendronate 70 MG tablet Commonly known as: FOSAMAX Take 1 tablet (70 mg total) by mouth once a week. Take with a full glass of water on an empty stomach.   amLODipine 10 MG tablet Commonly known as: NORVASC Take 1 tablet (10 mg total) by mouth daily.   aspirin EC 81 MG tablet Take 81 mg by mouth daily. Swallow whole.   cetirizine 10 MG tablet Commonly known as: ZyrTEC Allergy Take 1 tablet (10 mg total) by mouth daily.   fluticasone 50 MCG/ACT nasal spray Commonly known as: FLONASE Place 2 sprays into both nostrils daily.   gabapentin 100 MG capsule Commonly known as: NEURONTIN Take 1 capsule by mouth at bedtime   hydrochlorothiazide 25 MG tablet Commonly known as: HYDRODIURIL Take 1 tablet (25 mg total) by mouth daily.   naproxen sodium 220 MG tablet Commonly known as: ALEVE Take 440 mg by mouth 2 (two) times daily as needed (pain).   omeprazole 40 MG capsule Commonly known as: PRILOSEC TAKE 1 CAPSULE BY MOUTH ONCE DAILY 60 MINUTES BEFORE A MEAL   simvastatin 40 MG tablet Commonly known as: ZOCOR Take 1 tablet by mouth once daily with breakfast   VITAMIN D3 PO Take by mouth.          OBJECTIVE:   PHYSICAL EXAM: VS: BP 110/74 (BP Location: Left Arm, Patient Position: Sitting, Cuff Size: Small)   Pulse 96   Ht '5\' 6"'$  (1.676 m)   Wt 163 lb (73.9 kg)   SpO2 96%   BMI 26.31 kg/m    EXAM: General: Pt appears well and is in NAD  Neck: General: Supple without adenopathy. Thyroid: Thyroid size normal.  No goiter or nodules appreciated. No thyroid bruit.  Lungs: Clear with good BS bilat with no rales, rhonchi, or wheezes  Heart: Auscultation: RRR.  Abdomen: Normoactive bowel sounds, soft, nontender, without masses or organomegaly palpable  Extremities:  BL LE: No pretibial edema normal ROM and strength.  Mental Status: Judgment, insight: Intact Orientation: Oriented to time, place, and person Memory:  Intact for recent and remote events Mood and affect: No depression, anxiety, or agitation     DATA REVIEWED: ****   DXA 12/24/2021   The BMD measured at Forearm Radius 33% is 0.617 g/cm2 with a T-score of -3.0. This patient is considered osteoporotic according to [facility]) criteria.   The quality of the exam is good. The lumbar spine was excluded  due to degenerative changes. Left hip excluded due to surgical hardware. Right hip excluded due to surgical hardware.   Site Region Measured Date Measured Age YA BMD Significant CHANGE T-score Right Forearm Radius 33% 12/24/2021 69.6 -3.0 0.617 g/cm2  ASSESSMENT / PLAN / RECOMMENDATIONS:   Hypercalcemia    - Calcium normalized with discontinuation of HCTZ and stopping OTC calcium, his suspicion for   Primary hyperparathyroidism  - 24-hr urine Ca/Cr ratio of 0.019 with calcium of 224 mg - Encouraged hydration  - Advised to continue avoiding OTC calcium  - Pt to consume 2-3 servings of dietary calcium     2. Clinical osteoporosis:  - We discussed the high FRAX rate  Of > 3% at the hips as well as a hx of fragility fracture in the past - DXA showed osteoporosis of the left distal radius 12/24/2021 with a T score of -3.0 - DXA scan at the breast center    Medication :   Continue Alendronate 70 mg weekly    3. Vitamin D Deficiency :   - Resolved    Medication  Vitamin D3 2000 iu daily     F/U in 1 yr     Signed electronically by: Mack Guise, MD  [facility]  [address] [place], [place] [address] Phone: [phone] FAX: [phone]      CC: Dorna Mai, [address] [place] [address] Phone: [phone]  Fax: [phone]   Return to Endocrinology clinic as below: Future Appointments  Date Time Provider [place]  08/01/2022  8:10 AM Jenicka Coxe, Melanie Crazier, MD [facility] None   08/26/2022 12:40 PM [facility] None  11/21/2022  8:00 AM Dorna Mai, MD [facility] None

## 2022-08-02 LAB — PARATHYROID HORMONE, INTACT (NO CA): PTH: 49 pg/mL (ref 16–77)

## 2022-08-02 LAB — CALCIUM, IONIZED: Calcium, Ion: 5.3 mg/dL (ref 4.7–5.5)

## 2022-08-14 ENCOUNTER — Other Ambulatory Visit: Payer: Self-pay | Admitting: Gastroenterology

## 2022-08-26 ENCOUNTER — Ambulatory Visit (INDEPENDENT_AMBULATORY_CARE_PROVIDER_SITE_OTHER): Payer: PPO

## 2022-08-26 VITALS — Ht 66.0 in | Wt 159.0 lb

## 2022-08-26 DIAGNOSIS — Z0000 Encounter for general adult medical examination without abnormal findings: Secondary | ICD-10-CM

## 2022-08-26 NOTE — Progress Notes (Signed)
I connected with [name] today by telephone and verified that I am speaking with the correct person using two identifiers. Location patient: home Location provider: work Persons participating in the virtual visit: Varsha, Knock LPN.   I discussed the limitations, risks, security and privacy concerns of performing an evaluation and management service by telephone and the availability of in person appointments. I also discussed with the patient that there may be a patient responsible charge related to this service. The patient expressed understanding and verbally consented to this telephonic visit.    Interactive audio and video telecommunications were attempted between this provider and patient, however failed, due to patient having technical difficulties OR patient did not have access to video capability.  We continued and completed visit with audio only.     Vital signs may be patient reported or missing.  Subjective:   Lori Cook is a 71 y.o. female who presents for Medicare Annual (Subsequent) preventive examination.  Review of Systems     Cardiac Risk Factors include: advanced age (>58mn, >>82women);dyslipidemia;hypertension     Objective:    Today's Vitals   08/26/22 1156 08/26/22 1157  Weight: 159 lb (72.1 kg)   Height: '5\' 6"'$  (1.676 m)   PainSc:  2    Body mass index is 25.66 kg/m.     08/26/2022   12:02 PM 02/22/2021    9:02 AM 06/15/2020    2:20 PM 06/15/2020    8:41 AM 06/11/2020    1:03 PM 04/06/2020   10:29 PM 12/23/2019   11:24 AM  Advanced Directives  Does Patient Have a Medical Advance Directive? No No No No No No Yes  Type of Advance Directive       Living will;Healthcare Power of Attorney  Does patient want to make changes to medical advance directive?       No - Patient declined  [place]?       No - copy requested  Would patient like information on creating a medical advance directive?  Yes  (Inpatient - patient defers creating a medical advance directive at this time - Information given) No - Patient declined        Current Medications (verified) Outpatient Encounter Medications as of 08/26/2022  Medication Sig   acetaminophen (TYLENOL) 500 MG tablet Take 1,000 mg by mouth every 6 (six) hours as needed for moderate pain.    alendronate (FOSAMAX) 70 MG tablet Take 1 tablet (70 mg total) by mouth once a week.   amLODipine (NORVASC) 10 MG tablet Take 1 tablet (10 mg total) by mouth daily.   aspirin EC 81 MG tablet Take 81 mg by mouth daily. Swallow whole.   Cholecalciferol (VITAMIN D3 PO) Take by mouth.   fluticasone (FLONASE) 50 MCG/ACT nasal spray Place 2 sprays into both nostrils daily.   gabapentin (NEURONTIN) 100 MG capsule Take 1 capsule by mouth at bedtime   hydrochlorothiazide (HYDRODIURIL) 25 MG tablet Take 1 tablet (25 mg total) by mouth daily.   naproxen sodium (ALEVE) 220 MG tablet Take 440 mg by mouth 2 (two) times daily as needed (pain).   omeprazole (PRILOSEC) 40 MG capsule Take 1 capsule (40 mg total) by mouth daily. Please schedule an appointment for further refills. Thank you: [phone]  simvastatin (ZOCOR) 40 MG tablet Take 1 tablet by mouth once daily with breakfast   cetirizine (ZYRTEC ALLERGY) 10 MG tablet Take 1 tablet (10 mg total) by mouth daily. (  Patient not taking: Reported on 08/26/2022)   No facility-administered encounter medications on file as of 08/26/2022.    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   [place]    Past  Surgical History:  Procedure Laterality Date   ABDOMINAL HYSTERECTOMY     APPENDECTOMY     CHOLECYSTECTOMY     COLONOSCOPY     [facility]- records purges and destroyed- per pt Normal    JOINT REPLACEMENT N/A    [employer] 01/08/2021   SPINE SURGERY     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   Procedure: LEFT TOTAL HIP ARTHROPLASTY ANTERIOR APPROACH;  Surgeon: XLeandrew Koyanagi MD;  [facility]  Service: Orthopedics;  Laterality: Left;   TOTAL HIP ARTHROPLASTY Right 06/15/2020   Procedure: RIGHT TOTAL HIP ARTHROPLASTY ANTERIOR APPROACH;  Surgeon: XLeandrew Koyanagi MD;  [facility]  Service: Orthopedics;  Laterality: Right;   Family History  Problem Relation Age of Onset   Stroke Father    Diabetes Father    Heart disease Father 511      AMI   Hypertension Sister    Arthritis Brother    Mental illness Mother    Heart disease Paternal Uncle    Colon cancer Neg Hx    Colon polyps Neg Hx    Esophageal cancer Neg Hx    Rectal cancer Neg Hx    Stomach cancer Neg Hx    Social History   Socioeconomic History   Marital status: Single    Spouse name: Not on file   Number of children: Not on file   Years of education: Not on file   Highest education level: Not on file  Occupational History   Not on file  Tobacco Use   Smoking status: Former    Packs/day: 1.50    Years: 39.00    Total pack years: 58.50    Types: Cigarettes    Start date: 89   Quit date: 09/08/2010    Years since quitting: 11.9   Smokeless tobacco: Never  Vaping Use   Vaping Use: Never used  Substance and Sexual Activity   Alcohol use: No   Drug use: No   Sexual activity: Not on file  Other Topics Concern   Not on file  Social History Narrative   Marital status: single; not dating; not interested in 2018     Children: 2 daughters, 1 son passed away [id number] old.  Two grandchildren (20,8)      Lives: with daughter, grandson.      Employment:  CEducation administratorbusiness full time; one employee       Tobacco: quit smoking      Alcohol:  None      Exercise: no formal exercise      ADLs: independent with ADLs; drives; no assistant devices.       Advanced Directives: none in 2018.  FULL CODE; no prolonged measures. HCPOA: daughters   Social Determinants of Health  Financial Resource Strain: Low Risk  (08/26/2022)   Overall Financial Resource Strain (CARDIA)    Difficulty of Paying Living Expenses: Not hard at all  Food Insecurity: No Food Insecurity (08/26/2022)   Hunger Vital Sign    Worried About Running Out of Food in the Last Year: Never true    Ran Out of Food in the Last Year: Never true  Transportation Needs: No Transportation Needs (08/26/2022)   PRAPARE - Hydrologist (Medical): No    Lack of Transportation (Non-Medical): No  Physical Activity: Inactive (08/26/2022)   Exercise Vital Sign    Days of Exercise per Week: 0 days    Minutes of Exercise per Session: 0 min  Stress: No Stress Concern Present (08/26/2022)   Independence    Feeling of Stress : Not at all  Social Connections: Not on file    Tobacco Counseling Counseling given: Not Answered   Clinical Intake:  Pre-visit preparation completed: Yes  Pain : 0-10 Pain Score: 2  Pain Type: Acute pain Pain Location: Mouth Pain Descriptors / Indicators: Aching Pain Onset: More than a month ago Pain Frequency: Constant     Nutritional Status: BMI 25 -29 Overweight Nutritional Risks: None Diabetes: No  How often do you need to have someone help you when you read instructions, pamphlets, or other written materials from your doctor or pharmacy?: 1 - Never  Diabetic? no  Interpreter Needed?: No  Information entered by :: NAllen LPN   Activities of Daily Living    08/26/2022   12:03 PM 08/25/2022   12:24 PM  In your present state of health, do you have any difficulty performing the following activities:   Hearing? 0 0  Vision? 0 0  Difficulty concentrating or making decisions? 0 0  Walking or climbing stairs? 0 0  Dressing or bathing? 0 0  Doing errands, shopping? 0 0  Preparing Food and eating ? N N  Using the Toilet? N N  In the past six months, have you accidently leaked urine? Y Y  Do you have problems with loss of bowel control? N N  Managing your Medications? N N  Managing your Finances? N N  Housekeeping or managing your Housekeeping? N N    Patient Care Team: Dorna Mai, MD as PCP - General (Family Medicine)  Indicate any recent Medical Services you may have received from other than [facility] providers in the past year (date may be approximate).     Assessment:   This is a routine wellness examination for Lori Cook.  Hearing/Vision screen Vision Screening - Comments:: Regular eye exams, [facility]  Dietary issues and exercise activities discussed: Current Exercise Habits: The patient has a physically strenuous job, but has no regular exercise apart from work.   Goals Addressed             This Visit's Progress    Patient Stated       08/26/2022, wants to watch what she eats       Depression Screen    08/26/2022   12:03 PM 08/20/2021    8:18 AM 05/31/2021    3:36 PM 02/22/2021    9:03 AM 01/11/2021    3:21 PM 11/03/2020    4:08 PM 10/26/2020    2:55 PM  PHQ 2/9 Scores  PHQ - 2 Score 0 0 0 0 0 0 0  PHQ- 9 Score    0 0  Fall Risk    08/26/2022   12:02 PM 08/25/2022   12:24 PM 08/22/2022    7:36 PM 05/31/2021    3:51 PM 01/11/2021    3:21 PM  [place] in the past year? 0 0 0 1 0  Number falls in past yr: 0 0 0 0 0  Injury with Fall? 0 0 0 1 0  Risk for fall due to : Medication side effect   Impaired balance/gait No Fall Risks  Follow up Falls prevention discussed;Education provided;Falls evaluation completed   Falls evaluation completed Falls evaluation completed    FALL RISK PREVENTION PERTAINING TO THE HOME:  Any stairs in or around  the home? Yes  If so, are there any without handrails? No  Home free of loose throw rugs in walkways, pet beds, electrical cords, etc? Yes  Adequate lighting in your home to reduce risk of falls? Yes   ASSISTIVE DEVICES UTILIZED TO PREVENT FALLS:  Life alert? No  Use of a cane, walker or w/c? No  Grab bars in the bathroom? Yes  Shower chair or bench in shower? No  Elevated toilet seat or a handicapped toilet? Yes   TIMED UP AND GO:  Was the test performed? No .      Cognitive Function:    02/22/2021    9:03 AM  MMSE - Mini Mental State Exam  Orientation to time 5  Orientation to Place 5  Registration 3  Attention/ Calculation 5  Recall 3  Language- name 2 objects 2  Language- repeat 1  Language- follow 3 step command 3  Language- read & follow direction 1  Write a sentence 1  Copy design 1  Total score 30        08/26/2022   12:04 PM 12/23/2019   11:13 AM 11/21/2018    3:59 PM  6CIT Screen  What Year? 0 points 0 points   What month? 0 points 0 points 0 points  What time? 0 points 0 points   Count back from 20 0 points 0 points 0 points  Months in reverse 0 points 0 points 0 points  Repeat phrase 4 points 0 points 0 points  Total Score 4 points 0 points     Immunizations Immunization History  Administered Date(s) Administered   Fluad Quad(high Dose 65+) 06/21/2019   Influenza Split 07/28/2015   Influenza, Seasonal, Injecte, Preservative Fre 09/08/2012, 09/19/2013   Influenza,inj,Quad PF,6+ Mos 05/07/2016, 07/22/2017   Influenza-Unspecified 06/26/2019, 06/25/2021   PFIZER(Purple Top)SARS-COV-2 Vaccination 10/27/2019, 11/19/2019   Pneumococcal Conjugate-13 08/12/2017   Pneumococcal Polysaccharide-23 11/09/2013, 11/21/2018   Tdap 11/09/2013   Zoster Recombinat (Shingrix) 06/28/2018, 08/30/2018   Zoster, Live 09/12/2013    TDAP status: Up to date  Flu Vaccine status: Up to date  Pneumococcal vaccine status: Up to date  Covid-19 vaccine status:  Completed vaccines  Qualifies for Shingles Vaccine? Yes   Zostavax completed Yes   Shingrix Completed?: Yes  Screening Tests Health Maintenance  Topic Date Due   Lung Cancer Screening  08/29/2018   COVID-19 Vaccine (3 - Pfizer risk series) 12/17/2019   Medicare Annual Wellness (AWV)  02/22/2022   INFLUENZA VACCINE  04/12/2022   DTaP/Tdap/Td (2 - Td or Tdap) 11/10/2023   MAMMOGRAM  04/09/2024   COLONOSCOPY (Pts 45-47yr Insurance coverage will need to be confirmed)  10/29/2030   Pneumonia Vaccine 70 Years old  Completed   DEXA SCAN  Completed   Hepatitis C Screening  Completed   Zoster  Vaccines- Shingrix  Completed   HPV VACCINES  Aged Out    Health Maintenance  Health Maintenance Due  Topic Date Due   Lung Cancer Screening  08/29/2018   COVID-19 Vaccine (3 - Pfizer risk series) 12/17/2019   Medicare Annual Wellness (AWV)  02/22/2022   INFLUENZA VACCINE  04/12/2022    Colorectal cancer screening: No longer required.   Mammogram status: Completed 04/09/2022. Repeat every year  Bone Density status: Completed 12/24/2021.   Lung Cancer Screening: (Low Dose CT Chest recommended if Age 47-80 years, 30 pack-year currently smoking OR have quit w/in 15years.) does not qualify.   Lung Cancer Screening Referral: no  Additional Screening:  Hepatitis C Screening: does qualify; Completed 05/07/2016  Vision Screening: Recommended annual ophthalmology exams for early detection of glaucoma and other disorders of the eye. Is the patient up to date with their annual eye exam?  Yes  Who is the provider or what is the name of the office in which the patient attends annual eye exams? [facility] If pt is not established with a provider, would they like to be referred to a provider to establish care? No .   Dental Screening: Recommended annual dental exams for proper oral hygiene  Community Resource Referral / Chronic Care Management: CRR required this visit?  No   CCM required this  visit?  No      Plan:     I have personally reviewed and noted the following in the patient's chart:   Medical and social history Use of alcohol, tobacco or illicit drugs  Current medications and supplements including opioid prescriptions. Patient is not currently taking opioid prescriptions. Functional ability and status Nutritional status Physical activity Advanced directives List of other physicians Hospitalizations, surgeries, and ER visits in previous 12 months Vitals Screenings to include cognitive, depression, and falls Referrals and appointments  In addition, I have reviewed and discussed with patient certain preventive protocols, quality metrics, and best practice recommendations. A written personalized care plan for preventive services as well as general preventive health recommendations were provided to patient.     Kellie Simmering, LPN   [id number]   Nurse Notes: none  Due to this being a virtual visit, the after visit summary with patients personalized plan was offered to patient via mail or my-chart.  Patient would like to access on my-chart

## 2022-08-26 NOTE — Patient Instructions (Signed)
Lori Cook , Thank you for taking time to come for your Medicare Wellness Visit. I appreciate your ongoing commitment to your health goals. Please review the following plan we discussed and let me know if I can assist you in the future.   These are the goals we discussed:  Goals      DIET - EAT MORE FRUITS AND VEGETABLES     Get other hip fixed      Patient Stated     08/26/2022, wants to watch what she eats        This is a list of the screening recommended for you and due dates:  Health Maintenance  Topic Date Due   Screening for Lung Cancer  08/29/2018   COVID-19 Vaccine (3 - Pfizer risk series) 12/17/2019   Flu Shot  04/12/2022   Medicare Annual Wellness Visit  08/27/2023   DTaP/Tdap/Td vaccine (2 - Td or Tdap) 11/10/2023   Mammogram  04/09/2024   Colon Cancer Screening  10/29/2030   Pneumonia Vaccine  Completed   DEXA scan (bone density measurement)  Completed   Hepatitis C Screening: USPSTF Recommendation to screen - Ages 64-79 yo.  Completed   Zoster (Shingles) Vaccine  Completed   HPV Vaccine  Aged Out    Advanced directives: Please bring a copy of your POA (Power of [place]) and/or Living Will to your next appointment.   Conditions/risks identified: none  Next appointment: Follow up in one year for your annual wellness visit    Preventive Care 65 Years and Older, Female Preventive care refers to lifestyle choices and visits with your health care provider that can promote health and wellness. What does preventive care include? A yearly physical exam. This is also called an annual well check. Dental exams once or twice a year. Routine eye exams. Ask your health care provider how often you should have your eyes checked. Personal lifestyle choices, including: Daily care of your teeth and gums. Regular physical activity. Eating a healthy diet. Avoiding tobacco and drug use. Limiting alcohol use. Practicing safe sex. Taking low-dose aspirin every day. Taking  vitamin and mineral supplements as recommended by your health care provider. What happens during an annual well check? The services and screenings done by your health care provider during your annual well check will depend on your age, overall health, lifestyle risk factors, and family history of disease. Counseling  Your health care provider may ask you questions about your: Alcohol use. Tobacco use. Drug use. Emotional well-being. Home and relationship well-being. Sexual activity. Eating habits. History of falls. Memory and ability to understand (cognition). Work and work Statistician. Reproductive health. Screening  You may have the following tests or measurements: Height, weight, and BMI. Blood pressure. Lipid and cholesterol levels. These may be checked every 5 years, or more frequently if you are over 43 years old. Skin check. Lung cancer screening. You may have this screening every year starting at age 84 if you have a 30-pack-year history of smoking and currently smoke or have quit within the past 15 years. Fecal occult blood test (FOBT) of the stool. You may have this test every year starting at age 22. Flexible sigmoidoscopy or colonoscopy. You may have a sigmoidoscopy every 5 years or a colonoscopy every 10 years starting at age 34. Hepatitis C blood test. Hepatitis B blood test. Sexually transmitted disease (STD) testing. Diabetes screening. This is done by checking your blood sugar (glucose) after you have not eaten for a while (fasting). You may  have this done every 1-3 years. Bone density scan. This is done to screen for osteoporosis. You may have this done starting at age 35. Mammogram. This may be done every 1-2 years. Talk to your health care provider about how often you should have regular mammograms. Talk with your health care provider about your test results, treatment options, and if necessary, the need for more tests. Vaccines  Your health care provider may  recommend certain vaccines, such as: Influenza vaccine. This is recommended every year. Tetanus, diphtheria, and acellular pertussis (Tdap, Td) vaccine. You may need a Td booster every 10 years. Zoster vaccine. You may need this after age 32. Pneumococcal 13-valent conjugate (PCV13) vaccine. One dose is recommended after age 4. Pneumococcal polysaccharide (PPSV23) vaccine. One dose is recommended after age 74. Talk to your health care provider about which screenings and vaccines you need and how often you need them. This information is not intended to replace advice given to you by your health care provider. Make sure you discuss any questions you have with your health care provider. Document Released: 09/25/2015 Document Revised: 05/18/2016 Document Reviewed: 06/30/2015 Elsevier Interactive Patient Education  2017 [place] Prevention in the Home Falls can cause injuries. They can happen to people of all ages. There are many things you can do to make your home safe and to help prevent falls. What can I do on the outside of my home? Regularly fix the edges of walkways and driveways and fix any cracks. Remove anything that might make you trip as you walk through a door, such as a raised step or threshold. Trim any bushes or trees on the path to your home. Use bright outdoor lighting. Clear any walking paths of anything that might make someone trip, such as rocks or tools. Regularly check to see if handrails are loose or broken. Make sure that both sides of any steps have handrails. Any raised decks and porches should have guardrails on the edges. Have any leaves, snow, or ice cleared regularly. Use sand or salt on walking paths during winter. Clean up any spills in your garage right away. This includes oil or grease spills. What can I do in the bathroom? Use night lights. Install grab bars by the toilet and in the tub and shower. Do not use towel bars as grab bars. Use non-skid  mats or decals in the tub or shower. If you need to sit down in the shower, use a plastic, non-slip stool. Keep the floor dry. Clean up any water that spills on the floor as soon as it happens. Remove soap buildup in the tub or shower regularly. Attach bath mats securely with double-sided non-slip rug tape. Do not have throw rugs and other things on the floor that can make you trip. What can I do in the bedroom? Use night lights. Make sure that you have a light by your bed that is easy to reach. Do not use any sheets or blankets that are too big for your bed. They should not hang down onto the floor. Have a firm chair that has side arms. You can use this for support while you get dressed. Do not have throw rugs and other things on the floor that can make you trip. What can I do in the kitchen? Clean up any spills right away. Avoid walking on wet floors. Keep items that you use a lot in easy-to-reach places. If you need to reach something above you, use a strong step  stool that has a grab bar. Keep electrical cords out of the way. Do not use floor polish or wax that makes floors slippery. If you must use wax, use non-skid floor wax. Do not have throw rugs and other things on the floor that can make you trip. What can I do with my stairs? Do not leave any items on the stairs. Make sure that there are handrails on both sides of the stairs and use them. Fix handrails that are broken or loose. Make sure that handrails are as long as the stairways. Check any carpeting to make sure that it is firmly attached to the stairs. Fix any carpet that is loose or worn. Avoid having throw rugs at the top or bottom of the stairs. If you do have throw rugs, attach them to the floor with carpet tape. Make sure that you have a light switch at the top of the stairs and the bottom of the stairs. If you do not have them, ask someone to add them for you. What else can I do to help prevent falls? Wear shoes  that: Do not have high heels. Have rubber bottoms. Are comfortable and fit you well. Are closed at the toe. Do not wear sandals. If you use a stepladder: Make sure that it is fully opened. Do not climb a closed stepladder. Make sure that both sides of the stepladder are locked into place. Ask someone to hold it for you, if possible. Clearly mark and make sure that you can see: Any grab bars or handrails. First and last steps. Where the edge of each step is. Use tools that help you move around (mobility aids) if they are needed. These include: Canes. Walkers. Scooters. Crutches. Turn on the lights when you go into a dark area. Replace any light bulbs as soon as they burn out. Set up your furniture so you have a clear path. Avoid moving your furniture around. If any of your floors are uneven, fix them. If there are any pets around you, be aware of where they are. Review your medicines with your doctor. Some medicines can make you feel dizzy. This can increase your chance of falling. Ask your doctor what other things that you can do to help prevent falls. This information is not intended to replace advice given to you by your health care provider. Make sure you discuss any questions you have with your health care provider. Document Released: 06/25/2009 Document Revised: 02/04/2016 Document Reviewed: 10/03/2014 Elsevier Interactive Patient Education  2017 [employer].

## 2022-09-26 ENCOUNTER — Other Ambulatory Visit: Payer: PPO

## 2022-09-26 NOTE — Progress Notes (Signed)
Total volume 1,750 started 09-24-2022 at 7:30am.  Ended 09-25-2022 7:30 am.

## 2022-09-27 LAB — CREATININE, URINE, 24 HOUR: Creatinine, 24H Ur: 1.17 g/(24.h) (ref 0.50–2.15)

## 2022-09-27 LAB — CALCIUM, URINE, 24 HOUR: Calcium, 24H Urine: 28 mg/24 h — ABNORMAL LOW

## 2022-09-29 ENCOUNTER — Encounter: Payer: Self-pay | Admitting: Internal Medicine

## 2022-10-03 ENCOUNTER — Other Ambulatory Visit: Payer: Self-pay

## 2022-10-26 ENCOUNTER — Telehealth: Payer: PPO | Admitting: Physician Assistant

## 2022-10-26 DIAGNOSIS — H1030 Unspecified acute conjunctivitis, unspecified eye: Secondary | ICD-10-CM

## 2022-10-26 MED ORDER — POLYMYXIN B-TRIMETHOPRIM 10000-0.1 UNIT/ML-% OP SOLN: OPHTHALMIC | 0 refills | Status: DC

## 2022-10-26 NOTE — Progress Notes (Signed)

## 2022-10-26 NOTE — Progress Notes (Signed)
I have spent 5 minutes in review of e-visit questionnaire, review and updating patient chart, medical decision making and response to patient.   Yeng Perz Cody Aryonna Gunnerson, PA-C    

## 2022-10-28 ENCOUNTER — Other Ambulatory Visit: Payer: Self-pay | Admitting: Gastroenterology

## 2022-11-08 ENCOUNTER — Telehealth: Payer: Self-pay | Admitting: Gastroenterology

## 2022-11-08 NOTE — Telephone Encounter (Signed)
Patient is calling to schedule an appointment to get a refill on her Prilosec Rx, wondering if she can get a refill to hold her off till her appt 5/10. Says she won't need any until April. Please advise

## 2022-11-09 ENCOUNTER — Other Ambulatory Visit: Payer: Self-pay | Admitting: Family Medicine

## 2022-11-09 DIAGNOSIS — M79604 Pain in right leg: Secondary | ICD-10-CM

## 2022-11-21 ENCOUNTER — Encounter: Payer: Self-pay | Admitting: Family Medicine

## 2022-11-21 ENCOUNTER — Ambulatory Visit (INDEPENDENT_AMBULATORY_CARE_PROVIDER_SITE_OTHER): Payer: PPO | Admitting: Family Medicine

## 2022-11-21 VITALS — BP 122/72 | HR 67 | Temp 98.1°F | Resp 16 | Ht 66.0 in | Wt 161.6 lb

## 2022-11-21 DIAGNOSIS — M2550 Pain in unspecified joint: Secondary | ICD-10-CM | POA: Diagnosis not present

## 2022-11-21 DIAGNOSIS — I10 Essential (primary) hypertension: Secondary | ICD-10-CM | POA: Diagnosis not present

## 2022-11-21 DIAGNOSIS — K219 Gastro-esophageal reflux disease without esophagitis: Secondary | ICD-10-CM

## 2022-11-21 DIAGNOSIS — E785 Hyperlipidemia, unspecified: Secondary | ICD-10-CM | POA: Diagnosis not present

## 2022-11-21 NOTE — Progress Notes (Unsigned)
Patient is here for their 6 month follow-up Patient has no concerns today Care gaps have been discussed with patient  

## 2022-11-23 ENCOUNTER — Encounter: Payer: Self-pay | Admitting: Family Medicine

## 2022-11-24 ENCOUNTER — Encounter: Payer: Self-pay | Admitting: Family Medicine

## 2022-11-24 LAB — LIPID PANEL
Chol/HDL Ratio: 3.5 ratio (ref 0.0–4.4)
Cholesterol, Total: 201 mg/dL — ABNORMAL HIGH (ref 100–199)
HDL: 57 mg/dL (ref >39)
LDL Chol Calc (NIH): 115 mg/dL — ABNORMAL HIGH (ref 0–99)
Triglycerides: 165 mg/dL — ABNORMAL HIGH (ref 0–149)
VLDL Cholesterol Cal: 29 mg/dL (ref 5–40)

## 2022-11-24 LAB — BASIC METABOLIC PANEL
BUN/Creatinine Ratio: 19 (ref 12–28)
BUN: 15 mg/dL (ref 8–27)
CO2: 24 mmol/L (ref 20–29)
Calcium: 10.4 mg/dL — ABNORMAL HIGH (ref 8.7–10.3)
Chloride: 98 mmol/L (ref 96–106)
Creatinine, Ser: 0.81 mg/dL (ref 0.57–1.00)
Glucose: 100 mg/dL — ABNORMAL HIGH (ref 70–99)
Potassium: 3.8 mmol/L (ref 3.5–5.2)
Sodium: 141 mmol/L (ref 134–144)
eGFR: 78 mL/min/{1.73_m2} (ref >59)

## 2022-11-24 LAB — ALT: ALT: 14 IU/L (ref 0–32)

## 2022-11-24 LAB — SPECIMEN STATUS REPORT

## 2022-11-24 LAB — AST: AST: 18 IU/L (ref 0–40)

## 2022-11-24 NOTE — Progress Notes (Signed)
Established Patient Office Visit  Subjective    Patient ID: Lori Cook, female    DOB: October 23, 1950  Age: 72 y.o. MRN: [id number]  CC:  Chief Complaint  Patient presents with   Follow-up   Hypertension    HPI Lori Cook presents for routine follow up of chronic med issues. Patient denies acute complaints or concerns.    Outpatient Encounter Medications as of 11/21/2022  Medication Sig   acetaminophen (TYLENOL) 500 MG tablet Take 1,000 mg by mouth every 6 (six) hours as needed for moderate pain.    alendronate (FOSAMAX) 70 MG tablet Take 1 tablet (70 mg total) by mouth once a week.   aspirin EC 81 MG tablet Take 81 mg by mouth daily. Swallow whole.   Cholecalciferol (VITAMIN D3 PO) Take by mouth.   fluticasone (FLONASE) 50 MCG/ACT nasal spray Place 2 sprays into both nostrils daily.   gabapentin (NEURONTIN) 100 MG capsule Take 1 capsule by mouth at bedtime   naproxen sodium (ALEVE) 220 MG tablet Take 440 mg by mouth 2 (two) times daily as needed (pain).   omeprazole (PRILOSEC) 40 MG capsule Take 1 capsule (40 mg total) by mouth daily. Please schedule an appointment for further refills. Thank you: [phone]   simvastatin (ZOCOR) 40 MG tablet Take 1 tablet by mouth once daily with breakfast   amLODipine (NORVASC) 10 MG tablet Take 1 tablet (10 mg total) by mouth daily.   cetirizine (ZYRTEC ALLERGY) 10 MG tablet Take 1 tablet (10 mg total) by mouth daily. (Patient not taking: Reported on 08/26/2022)   hydrochlorothiazide (HYDRODIURIL) 25 MG tablet Take 1 tablet (25 mg total) by mouth daily.   [DISCONTINUED] trimethoprim-polymyxin b (POLYTRIM) ophthalmic solution Apply 1-2 drops into affected eye QID x 5 days.   No facility-administered encounter medications on file as of 11/21/2022.    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   Stroke ([facility] [place]    in retina only     Past Surgical History:  Procedure Laterality Date   ABDOMINAL HYSTERECTOMY     APPENDECTOMY     CHOLECYSTECTOMY     COLONOSCOPY     [facility]- records purges and destroyed- per pt Normal    JOINT REPLACEMENT N/A    [employer] 01/08/2021   SPINE SURGERY     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   Procedure: LEFT TOTAL HIP ARTHROPLASTY ANTERIOR APPROACH;  Surgeon: XLeandrew Koyanagi MD;  [facility]  Service: Orthopedics;  Laterality: Left;   TOTAL HIP ARTHROPLASTY Right 06/15/2020   Procedure: RIGHT TOTAL HIP ARTHROPLASTY ANTERIOR APPROACH;  Surgeon: XLeandrew Koyanagi MD;  [facility]  Service: Orthopedics;  Laterality: Right;    Family History  Problem Relation Age of Onset   Stroke Father    Diabetes Father    Heart disease Father 536      AMI   Hypertension Sister    Arthritis Brother    Mental illness Mother    Heart disease Paternal Uncle    Colon cancer Neg Hx    Colon polyps Neg  Hx    Esophageal cancer Neg Hx    Rectal cancer Neg Hx    Stomach cancer Neg Hx     Social History   Socioeconomic History   Marital status: Single    Spouse name: Not on file   Number of children: Not on file   Years of education: Not on file   Highest education level: Not on file  Occupational History   Not on file  Tobacco Use   Smoking status: Former    Packs/day: 1.50    Years: 39.00    Additional pack years: 0.00    Total pack years: 58.50    Types: Cigarettes    Start date: 34    Quit date: 09/08/2010    Years since quitting: 12.2   Smokeless tobacco: Never  Vaping Use   Vaping Use: Never used  Substance and Sexual Activity   Alcohol use: No   Drug use: No   Sexual activity: Not on file  Other Topics Concern   Not on file   Social History Narrative   Marital status: single; not dating; not interested in 2018     Children: 2 daughters, 1 son passed away 25.42 years old.  Two grandchildren (20,8)      Lives: with daughter, grandson.      Employment:  Education administrator business full time; one employee      Tobacco: quit smoking      Alcohol:  None      Exercise: no formal exercise      ADLs: independent with ADLs; drives; no assistant devices.       Advanced Directives: none in 2018.  FULL CODE; no prolonged measures. HCPOA: daughters   Social Determinants of Health   Financial Resource Strain: Low Risk  (08/26/2022)   Overall Financial Resource Strain (CARDIA)    Difficulty of Paying Living Expenses: Not hard at all  Food Insecurity: No Food Insecurity (08/26/2022)   Hunger Vital Sign    Worried About Running Out of Food in the Last Year: Never true    Ran Out of Food in the Last Year: Never true  Transportation Needs: No Transportation Needs (08/26/2022)   PRAPARE - Hydrologist (Medical): No    Lack of Transportation (Non-Medical): No  Physical Activity: Inactive (08/26/2022)   Exercise Vital Sign    Days of Exercise per Week: 0 days    Minutes of Exercise per Session: 0 min  Stress: No Stress Concern Present (08/26/2022)   [place]    Feeling of Stress : Not at all  Social Connections: Not on file  Intimate Partner Violence: Not on file    Review of Systems  All other systems reviewed and are negative.       Objective    BP 122/72   Pulse 67   Temp 98.1 F (36.7 C) (Oral)   Resp 16   Ht '5\' 6"'$  (1.676 m)   Wt 161 lb 9.6 oz (73.3 kg)   SpO2 96%   BMI 26.08 kg/m   Physical Exam Vitals and nursing note reviewed.  Constitutional:      General: She is not in acute distress. Cardiovascular:     Rate and Rhythm: Normal rate and regular rhythm.  Pulmonary:     Effort: Pulmonary effort is normal.      Breath sounds: Normal breath sounds.  Abdominal:     Palpations: Abdomen is soft.  Tenderness: There is no abdominal tenderness.  Musculoskeletal:     Right lower leg: No edema.     Left lower leg: No edema.  Neurological:     General: No focal deficit present.     Mental Status: She is alert and oriented to person, place, and time.         Assessment & Plan:   1. Essential hypertension Appears stable. Continue  - Basic Metabolic Panel - AST - ALT - Lipid Panel  2. Hyperlipidemia, unspecified hyperlipidemia type Continue  - Basic Metabolic Panel - AST - ALT - Lipid Panel  3. Arthralgia, unspecified joint   4. Gastroesophageal reflux disease, unspecified whether esophagitis present Continue     Return in about 6 months (around 05/24/2023) for follow up.   Becky Sax, MD

## 2022-12-05 NOTE — Telephone Encounter (Signed)
Please advise patient about her lab results

## 2023-01-11 ENCOUNTER — Other Ambulatory Visit: Payer: Self-pay | Admitting: Family Medicine

## 2023-01-11 DIAGNOSIS — E785 Hyperlipidemia, unspecified: Secondary | ICD-10-CM

## 2023-01-11 DIAGNOSIS — I10 Essential (primary) hypertension: Secondary | ICD-10-CM

## 2023-01-11 NOTE — Telephone Encounter (Signed)
Requested Prescriptions  Pending Prescriptions Disp Refills   hydrochlorothiazide (HYDRODIURIL) 25 MG tablet [Pharmacy Med Name: hydroCHLOROthiazide 25 MG Oral Tablet] 90 tablet 0    Sig: Take 1 tablet by mouth once daily     Cardiovascular: Diuretics - Thiazide Passed - 01/11/2023  7:27 AM      Passed - Cr in normal range and within 180 days    Creat  Date Value Ref Range Status  05/07/2016 0.86 0.50 - 0.99 mg/dL Final    Comment:      For patients > or = 71 years of age: The upper reference limit for Creatinine is approximately 13% higher for people identified as African-American.      Creatinine, Ser  Date Value Ref Range Status  11/21/2022 0.81 0.57 - 1.00 mg/dL Final         Passed - K in normal range and within 180 days    Potassium  Date Value Ref Range Status  11/21/2022 3.8 3.5 - 5.2 mmol/L Final         Passed - Na in normal range and within 180 days    Sodium  Date Value Ref Range Status  11/21/2022 141 134 - 144 mmol/L Final         Passed - Last BP in normal range    BP Readings from Last 1 Encounters:  11/21/22 122/72         Passed - Valid encounter within last 6 months    Recent Outpatient Visits           1 month ago Essential hypertension   [place] [facility], MD   7 months ago Essential hypertension   [place] [facility], MD   1 year ago Essential hypertension   [place] [facility], MD   1 year ago Hypokalemia   [place] [facility], Lauris Poag, MD   1 year ago Essential hypertension   [place] [facility] [place], NP       Future Appointments             In 4 months Georganna Skeans, MD [facility]             simvastatin (ZOCOR) 40 MG tablet [Pharmacy Med Name: Simvastatin 40 MG Oral Tablet] 90 tablet 0    Sig: Take 1 tablet by mouth  once daily with breakfast     Cardiovascular:  Antilipid - Statins Failed - 01/11/2023  7:27 AM      Failed - Lipid Panel in normal range within the last 12 months    Cholesterol, Total  Date Value Ref Range Status  11/21/2022 201 (H) 100 - 199 mg/dL Final   LDL Chol Calc (NIH)  Date Value Ref Range Status  11/21/2022 115 (H) 0 - 99 mg/dL Final   HDL  Date Value Ref Range Status  11/21/2022 57 >39 mg/dL Final   Triglycerides  Date Value Ref Range Status  11/21/2022 165 (H) 0 - 149 mg/dL Final         Passed - Patient is not pregnant      Passed - Valid encounter within last 12 months    Recent Outpatient Visits           1 month ago Essential hypertension   [place] [facility],  Lauris Poag, MD   7 months ago Essential hypertension   [place] [facility], MD   1 year ago Essential hypertension   [employer] [employer] [facility], MD   1 year ago Hypokalemia   [place] [facility], MD   1 year ago Essential hypertension   [place] [facility] [place], NP       Future Appointments             In 4 months Georganna Skeans, MD [facility]             amLODipine (NORVASC) 10 MG tablet [Pharmacy Med Name: amLODIPine Besylate 10 MG Oral Tablet] 90 tablet 0    Sig: Take 1 tablet by mouth once daily     Cardiovascular: Calcium Channel Blockers 2 Passed - 01/11/2023  7:27 AM      Passed - Last BP in normal range    BP Readings from Last 1 Encounters:  11/21/22 122/72         Passed - Last Heart Rate in normal range    Pulse Readings from Last 1 Encounters:  11/21/22 67         Passed - Valid encounter within last 6 months    Recent Outpatient Visits           1 month ago Essential hypertension   [place] [facility], MD   7 months ago Essential  hypertension   [place] [facility], MD   1 year ago Essential hypertension   [place] [facility], MD   1 year ago Hypokalemia   [place] [facility], MD   1 year ago Essential hypertension   [place] [facility] [place], NP       Future Appointments             In 4 months Georganna Skeans, MD [facility]

## 2023-01-20 ENCOUNTER — Encounter: Payer: Self-pay | Admitting: Gastroenterology

## 2023-01-20 ENCOUNTER — Ambulatory Visit: Payer: PPO | Admitting: Gastroenterology

## 2023-01-20 VITALS — BP 124/72 | HR 65 | Ht 66.0 in | Wt 161.0 lb

## 2023-01-20 DIAGNOSIS — M810 Age-related osteoporosis without current pathological fracture: Secondary | ICD-10-CM

## 2023-01-20 DIAGNOSIS — Z79899 Other long term (current) drug therapy: Secondary | ICD-10-CM

## 2023-01-20 DIAGNOSIS — K219 Gastro-esophageal reflux disease without esophagitis: Secondary | ICD-10-CM | POA: Diagnosis not present

## 2023-01-20 DIAGNOSIS — R053 Chronic cough: Secondary | ICD-10-CM | POA: Diagnosis not present

## 2023-01-20 DIAGNOSIS — Z87891 Personal history of nicotine dependence: Secondary | ICD-10-CM

## 2023-01-20 MED ORDER — OMEPRAZOLE 40 MG PO CPDR: 40.0000 mg | DELAYED_RELEASE_CAPSULE | Freq: Every day | ORAL | 1 refills | Status: DC

## 2023-01-20 NOTE — Progress Notes (Signed)
HPI :  72 year old female here for a follow-up visit for history of suspected reflux and chronic cough.  I last saw her in December 2022.  Recall she has a history of chronic cough for years.  Etiology was unclear over time and she took some Pepcid which she thought helped for some time.  At the time she really did not have any heartburn or regurgitation that bothered her, however in June 2022 she had worsening symptoms with persistent cough and day and night.  Chest x-ray was negative, treated for bronchitis which did not help.  CT scan of her chest showed some benign nodules but nothing concerning.  We discussed options at the last visit and put her on some Prilosec 20 mg.  Initially that did help but symptoms persisted to some extent.  We increased her Prilosec to 40 mg a day and she has been on that for most of the past year.  She states the cough is definitely better on the regimen but perhaps did not slightly resolve although she was much better compared to before hand.  She states she tried reducing her dose and stopped over the past 2 weeks, thinks symptoms perhaps may have come back.  She has not really had much typical reflux symptoms, can have coughing with eating and have some at night however.  Tries to sleep with her head of the bed elevated.  She does have some belching and burping that is been worse lately.  She does feel that omeprazole 40 mg helps more than the 20 mg.  She denies any nausea or vomiting.  No dysphagia.  Of note she did smoke tobacco for several years but quit.  Has no prior PFTs.  Recall she does have a history of osteoporosis on Fosamax.  She has had 2 hip replacements and a wrist fracture many years ago.    Prior work-up: Colonoscopy 10/29/20 - The perianal and digital rectal examinations were normal. - A few small-mouthed diverticula were found in the sigmoid colon. - The colon was tortuous. The rectosigmoid colon was extremely angulated and visualization was  difficult in this area. - Internal hemorrhoids were found during retroflexion. The hemorrhoids were small. - The exam was otherwise without abnormality.   Likely no further screening needed     Colonoscopy 09/07/2017 - The perianal and digital rectal examinations were normal. - The terminal ileum appeared normal. - Localized mild inflammation characterized by erosions, erythema and friability was found in the cecum. Biopsies were taken with a cold forceps for histology. - The colon was tortuous with a very angulated rectosigmoid colon. - A 5 mm polyp was found in the transverse colon. The polyp was sessile. The polyp was removed with a cold snare. Resection and retrieval were complete. - Two sessile polyps were found in the sigmoid colon. The polyps were 4 to 5 mm in size. These polyps were removed with a cold snare. Resection and retrieval were complete. - A 4 mm polyp was found in the recto-sigmoid colon. The polyp was sessile. The polyp was removed with a cold snare. Resection and retrieval were complete. - Internal hemorrhoids were found during retroflexion. The hemorrhoids were small. - The exam was otherwise without abnormality.      Past Medical History:  Diagnosis Date   Allergy    Arthritis    Blood transfusion without reported diagnosis    Cataract    Cholelithiasis    large stone detected by CT 01/2016   GERD (gastroesophageal  reflux disease)    Hyperlipidemia    Hypertension    Nephrolithiasis    2mm kidney stone L by CT 01/2016   Osteoporosis    Retinal vein occlusion, 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   Stroke ([facility])    in retina only      Past Surgical History:  Procedure Laterality Date   ABDOMINAL HYSTERECTOMY     APPENDECTOMY     CHOLECYSTECTOMY     COLONOSCOPY     [facility]- records purges and destroyed- per pt Normal    JOINT REPLACEMENT N/A    [employer] 01/08/2021   SPINE SURGERY     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   Procedure: LEFT TOTAL HIP  ARTHROPLASTY ANTERIOR APPROACH;  Surgeon: Tarry Kos, MD;  [facility];  Service: Orthopedics;  Laterality: Left;   TOTAL HIP ARTHROPLASTY Right 06/15/2020   Procedure: RIGHT TOTAL HIP ARTHROPLASTY ANTERIOR APPROACH;  Surgeon: Tarry Kos, MD;  [facility];  Service: Orthopedics;  Laterality: Right;   Family History  Problem Relation Age of Onset   Stroke Father    Diabetes Father    Heart disease Father 1       AMI   Hypertension Sister    Arthritis Brother    Mental illness Mother    Heart disease Paternal Uncle    Colon cancer Neg Hx    Colon polyps Neg Hx    Esophageal cancer Neg Hx    Rectal cancer Neg Hx    Stomach cancer Neg Hx    Social History   Tobacco Use   Smoking status: Former    Packs/day: 1.50    Years: 39.00    Additional pack years: 0.00    Total pack years: 58.50    Types: Cigarettes    Start date: 54    Quit date: 09/08/2010    Years since quitting: 12.3   Smokeless tobacco: Never  Vaping Use   Vaping Use: Never used  Substance Use Topics   Alcohol use: No   Drug use: No   Current Outpatient Medications  Medication Sig Dispense Refill   acetaminophen (TYLENOL) 500 MG tablet Take 1,000 mg by mouth every 6 (six) hours as needed for moderate pain.      alendronate (FOSAMAX) 70 MG tablet Take 1 tablet (70 mg total) by mouth once a week. 12 tablet 3   amLODipine (NORVASC) 10 MG tablet Take 1 tablet by mouth once daily 90 tablet 0   aspirin EC 81 MG tablet Take 81 mg by mouth daily. Swallow whole.     Cholecalciferol (VITAMIN D3 PO) Take by mouth.     fluticasone (FLONASE) 50 MCG/ACT nasal spray Place 2 sprays into both nostrils daily. 1 g 0   gabapentin (NEURONTIN) 100 MG capsule Take 1 capsule by mouth at bedtime 90 capsule 0   hydrochlorothiazide (HYDRODIURIL) 25 MG tablet Take 1 tablet by mouth once daily 90 tablet 0   naproxen sodium (ALEVE) 220 MG tablet Take 440 mg by mouth 2 (two) times daily as needed (pain).     omeprazole  (PRILOSEC) 40 MG capsule Take 1 capsule (40 mg total) by mouth daily. Please schedule an appointment for further refills. Thank you: [phone] 30 capsule 1   simvastatin (ZOCOR) 40 MG tablet Take 1 tablet by mouth once daily with breakfast 90 tablet 0   cetirizine (ZYRTEC ALLERGY) 10 MG tablet Take 1 tablet (10 mg total) by mouth daily. (Patient not taking: Reported on 01/20/2023) 30 tablet  0   No current facility-administered medications for this visit.   Allergies  Allergen Reactions   Other    Tramadol Nausea And Vomiting   Codeine Other (See Comments)    Headache   Oxycodone Palpitations     Review of Systems: All systems reviewed and negative except where noted in HPI.   Lab Results  Component Value Date   WBC 10.1 11/03/2020   HGB 13.7 11/03/2020   HCT 41.3 11/03/2020   MCV 83 11/03/2020   PLT 340 11/03/2020    Lab Results  Component Value Date   CREATININE 0.81 11/21/2022   BUN 15 11/21/2022   NA 141 11/21/2022   K 3.8 11/21/2022   CL 98 11/21/2022   CO2 24 11/21/2022    Lab Results  Component Value Date   ALT 14 11/21/2022   AST 18 11/21/2022   ALKPHOS 80 05/31/2021   BILITOT 0.4 05/31/2021    Physical Exam: BP 124/72   Pulse 65   Ht 5\' 6"  (1.676 m)   Wt 161 lb (73 kg)   BMI 25.99 kg/m  Constitutional: Pleasant,well-developed, female in no acute distress. Neurological: Alert and oriented to person place and time. Psychiatric: Normal mood and affect. Behavior is normal.   ASSESSMENT: 72 y.o. female here for assessment of the following  1. Chronic cough   2. Gastroesophageal reflux disease, unspecified whether esophagitis present   3. Long-term current use of proton pump inhibitor therapy   4. Osteoporosis without current pathological fracture, unspecified osteoporosis type   5. History of tobacco use    72 year old female here for reassessment of chronic cough.  She has not had much typical reflux symptoms in general, however her cough does  seem to respond to moderate dose PPI and has helped, but not completely resolved.  She has been on PPI now for the past year or so.  She tried stopping it and symptoms perhaps got worse.  Of note she has used tobacco for years in the past and has since quit.  She has never had PFTs.  We discussed options.  Given her response to PPI do think reflux is contributing if not causing her cough.  If PPI has helped her I think she should continue it.  We discussed long-term risks of chronic PPI use to include increased risk for bone fracture she does have a history of osteoporosis.  Want to use lowest dose needed to control her symptoms.  She has never had an EGD, I think reasonable to assess for erosive changes, large hiatal hernia, rule out Barrett's etc.  I discussed risk and benefits of the procedure and anesthesia, she agrees and wants to proceed.  Will see what we find in relation to her chronic cough.  I otherwise do think it would be useful for her to see pulmonary for baseline PFTs to ensure no COPD or pulmonary process that could be contributing to her cough as well.  She agrees with this.  Further recommendations pending results of her testing and course  PLAN: - EGD [facility] the [facility] - Monday AM - rule out large HH, BE, assess for erosive changes - refill omeprazole 40mg  / day - long term use the lowest dose of PPI needed to control symptoms - discussed long term risks of chronic PPI use - she does have osteporosis but prefers 40mg  than 20mg .  - refer to [place] [facility] - needs PFTs, rule out COPD  Harlin Rain, MD [facility]

## 2023-01-20 NOTE — Patient Instructions (Addendum)
You have been scheduled for an endoscopy. Please follow written instructions given to you at your visit today. If you use inhalers (even only as needed), please bring them with you on the day of your procedure.  We have sent the following medications to your pharmacy for you to pick up at your convenience: Omeprazole 40 mg daily  We have placed a referral to [facility] for evaluation of your chronic cough and to rule out COPD. If you do not hear from their office with an appointment within the next 2 weeks, please contact them directly at [phone].  _______________________________________________________  If your blood pressure at your visit was 140/90 or greater, please contact your primary care physician to follow up on this.  _______________________________________________________  If you are age 71 or older, your body mass index should be between 23-30. Your Body mass index is 25.99 kg/m. If this is out of the aforementioned range listed, please consider follow up with your Primary Care Provider.  If you are age 71 or younger, your body mass index should be between 19-25. Your Body mass index is 25.99 kg/m. If this is out of the aformentioned range listed, please consider follow up with your Primary Care Provider.   ________________________________________________________  The [place] [facility] providers would like to encourage you to use [facility] to communicate with providers for non-urgent requests or questions.  Due to long hold times on the telephone, sending your provider a message by [facility] may be a faster and more efficient way to get a response.  Please allow 48 business hours for a response.  Please remember that this is for non-urgent requests.  _______________________________________________________  Due to recent changes in healthcare laws, you may see the results of your imaging and laboratory studies on MyChart before your provider has had a chance to review them.  We  understand that in some cases there may be results that are confusing or concerning to you. Not all laboratory results come back in the same time frame and the provider may be waiting for multiple results in order to interpret others.  Please give [place] 48 hours in order for your provider to thoroughly review all the results before contacting the office for clarification of your results.

## 2023-01-23 ENCOUNTER — Encounter: Payer: Self-pay | Admitting: Gastroenterology

## 2023-01-23 ENCOUNTER — Ambulatory Visit (AMBULATORY_SURGERY_CENTER): Payer: PPO | Admitting: Gastroenterology

## 2023-01-23 VITALS — BP 117/64 | HR 56 | Temp 98.0°F | Resp 12 | Ht 66.0 in | Wt 161.0 lb

## 2023-01-23 DIAGNOSIS — R053 Chronic cough: Secondary | ICD-10-CM

## 2023-01-23 DIAGNOSIS — K219 Gastro-esophageal reflux disease without esophagitis: Secondary | ICD-10-CM

## 2023-01-23 MED ORDER — SODIUM CHLORIDE 0.9 % IV SOLN: 500.0000 mL | Freq: Once | INTRAVENOUS | Status: DC

## 2023-01-23 NOTE — Progress Notes (Signed)
History and Physical Interval Note: See clinic note 01/20/23 for details - no interval changes. History of chronic cough improved by but not resolved with PPI. EGD to further evaluate, help clarify if GERD is causing or contributing to cough. She otherwise feels well without complaints today. No cardiopulmonary symptoms.    01/23/2023 10:08 AM  Lori Cook  has presented today for endoscopic procedure(s), with the diagnosis of  Encounter Diagnoses  Name Primary?   Chronic cough Yes   Gastroesophageal reflux disease, unspecified whether esophagitis present   .  The various methods of evaluation and treatment have been discussed with the patient and/or family. After consideration of risks, benefits and other options for treatment, the patient has consented to  the endoscopic procedure(s).   The patient's history has been reviewed, patient examined, no change in status, stable for surgery.  I have reviewed the patient's chart and labs.  Questions were answered to the patient's satisfaction.    Harlin Rain, MD [facility]

## 2023-01-23 NOTE — Patient Instructions (Signed)
Continue taking Prilosec  (omeprazole), use lowest does needed to control/ benefit symptoms.    YOU HAD AN ENDOSCOPIC PROCEDURE TODAY AT THE [facility]:   Refer to the procedure report that was given to you for any specific questions about what was found during the examination.  If the procedure report does not answer your questions, please call your gastroenterologist to clarify.  If you requested that your care partner not be given the details of your procedure findings, then the procedure report has been included in a sealed envelope for you to review at your convenience later.  YOU SHOULD EXPECT: Some feelings of bloating in the abdomen. Passage of more gas than usual.  Walking can help get rid of the air that was put into your GI tract during the procedure and reduce the bloating. If you had a lower endoscopy (such as a colonoscopy or flexible sigmoidoscopy) you may notice spotting of blood in your stool or on the toilet paper. If you underwent a bowel prep for your procedure, you may not have a normal bowel movement for a few days.  Please Note:  You might notice some irritation and congestion in your nose or some drainage.  This is from the oxygen used during your procedure.  There is no need for concern and it should clear up in a day or so.  SYMPTOMS TO REPORT IMMEDIATELY:   Following upper endoscopy (EGD)  Vomiting of blood or coffee ground material  New chest pain or pain under the shoulder blades  Painful or persistently difficult swallowing  New shortness of breath  Fever of 100F or higher  Black, tarry-looking stools  For urgent or emergent issues, a gastroenterologist can be reached at any hour by calling [phone]. Do not use MyChart messaging for urgent concerns.    DIET:  We do recommend a small meal at first, but then you may proceed to your regular diet.  Drink plenty of fluids but you should avoid alcoholic beverages for 24 hours.  ACTIVITY:  You  should plan to take it easy for the rest of today and you should NOT DRIVE or use heavy machinery until tomorrow (because of the sedation medicines used during the test).    FOLLOW UP: Our staff will call the number listed on your records the next business day following your procedure.  We will call around 7:15- 8:00 am to check on you and address any questions or concerns that you may have regarding the information given to you following your procedure. If we do not reach you, we will leave a message.     If any biopsies were taken you will be contacted by phone or by letter within the next 1-3 weeks.  Please call us at [phone] if you have not heard about the biopsies in 3 weeks.    SIGNATURES/CONFIDENTIALITY: You and/or your care partner have signed paperwork which will be entered into your electronic medical record.  These signatures attest to the fact that that the information above on your After Visit Summary has been reviewed and is understood.  Full responsibility of the confidentiality of this discharge information lies with you and/or your care-partner.

## 2023-01-23 NOTE — Progress Notes (Signed)
Uneventful anesthetic. Report to pacu rn. Vss on [place] O2. Care resumed by rn. 

## 2023-01-23 NOTE — Op Note (Signed)
[facility] Patient Name: Lori Cook Procedure Date: 01/23/2023 10:10 AM MRN: [id number] Endoscopist: Viviann Spare P. Adela Lank , MD, [id number] Age: 72 Referring MD:  Date of Birth: [birth year] Gender: Female Account #: [id number] Procedure:                Upper GI endoscopy Indications:              Suspected gastro-esophageal reflux disease, Chronic                            cough - improved on omeprazole 40mg  / day but not                            resolved. Medicines:                Monitored Anesthesia Care Procedure:                Pre-Anesthesia Assessment:                           - Prior to the procedure, a History and Physical                            was performed, and patient medications and                            allergies were reviewed. The patient's tolerance of                            previous anesthesia was also reviewed. The risks                            and benefits of the procedure and the sedation                            options and risks were discussed with the patient.                            All questions were answered, and informed consent                            was obtained. Prior Anticoagulants: The patient has                            taken no anticoagulant or antiplatelet agents. ASA                            Grade Assessment: II - A patient with mild systemic                            disease. After reviewing the risks and benefits,                            the patient was deemed in satisfactory condition to  undergo the procedure.                           After obtaining informed consent, the endoscope was                            passed under direct vision. Throughout the                            procedure, the patient's blood pressure, pulse, and                            oxygen saturations were monitored continuously. The                            Olympus Scope [id number] was introduced  through the                            mouth, and advanced to the second part of duodenum.                            The upper GI endoscopy was accomplished without                            difficulty. The patient tolerated the procedure                            well. Scope In: Scope Out: Findings:                 Esophagogastric landmarks were identified: the                            Z-line was found at 35 cm, the gastroesophageal                            junction was found at 35 cm and the upper extent of                            the gastric folds was found at 36 cm from the                            incisors.                           A 1 cm hiatal hernia was present.                           There was a mild / widely patent Shatski ring at                            the GEJ. The exam of the esophagus was otherwise                            normal.  The entire examined stomach was normal.                           The examined duodenum was normal. Complications:            No immediate complications. Estimated blood loss:                            None. Estimated Blood Loss:     Estimated blood loss: none. Impression:               - Esophagogastric landmarks identified.                           - 1 cm hiatal hernia.                           - Widely patent / mild Shatski ring                           - Normal esophagus otherwise - no Barrett's                           - Normal stomach.                           - Normal examined duodenum. Recommendation:           - Patient has a contact number available for                            emergencies. The signs and symptoms of potential                            delayed complications were discussed with the                            patient. Return to normal activities tomorrow.                            Written discharge instructions were provided to the                             patient.                           - Resume previous diet.                           - Continue present medications.                           - Continue omeprazole, use lowest dose needed to                            control / benefit symptoms                           -  Patient was referred to Pulmonary for PFTs and                            further evaluation (tobacco history) to evaluate                            for other causes of chronic cough [name] P. [name], MD 01/23/2023 10:24:20 AM This report has been signed electronically.

## 2023-01-23 NOTE — Progress Notes (Signed)
VS by CW  Pt's states no medical or surgical changes since previsit or office visit.  

## 2023-01-24 ENCOUNTER — Telehealth: Payer: Self-pay

## 2023-01-24 NOTE — Telephone Encounter (Signed)
Left message on follow up call. 

## 2023-02-02 ENCOUNTER — Other Ambulatory Visit: Payer: Self-pay | Admitting: Family Medicine

## 2023-02-02 DIAGNOSIS — M79604 Pain in right leg: Secondary | ICD-10-CM

## 2023-02-23 ENCOUNTER — Encounter: Payer: Self-pay | Admitting: Pulmonary Disease

## 2023-02-23 ENCOUNTER — Ambulatory Visit (INDEPENDENT_AMBULATORY_CARE_PROVIDER_SITE_OTHER): Payer: PPO | Admitting: Pulmonary Disease

## 2023-02-23 VITALS — BP 122/72 | HR 70 | Temp 97.7°F | Ht 66.0 in | Wt 160.8 lb

## 2023-02-23 DIAGNOSIS — F17201 Nicotine dependence, unspecified, in remission: Secondary | ICD-10-CM | POA: Diagnosis not present

## 2023-02-23 DIAGNOSIS — R053 Chronic cough: Secondary | ICD-10-CM | POA: Diagnosis not present

## 2023-02-23 NOTE — Patient Instructions (Signed)
It is nice to meet you  Since the cough reliably improves with the omeprazole and reliably worsens when you are off of this medication it is most likely the cough is related to reflux.  With your hiatal hernia you are at high risk of having silent reflux.  Suppression of acid will help reduce esophagitis and severity of irritation but the reflux will continue to cause laryngeal inflammation and lead to cough.  Fortunately with acid suppression your cough has improved with the decrease in irritation.  Continue omeprazole as you are  No changes to medications  If you develop worsening cough despite omeprazole or develop shortness of breath or shortness of breath with exertion please let me know and I am happy to reevaluate  Return to clinic as needed

## 2023-02-23 NOTE — Progress Notes (Signed)
@Patient  ID: Lori Cook, female    DOB: May 15, 1951, 72 y.o.   MRN: [id number]  Chief Complaint  Patient presents with   Consult    Pt has had this cough for 5-6 years. Pt had a cxr which lead to her doctor putting heron reflux medication which controlled it for awhile. Pt ha started prilosec which helped as well and pt states her cough is a lot better.     Referring provider: Benancio Deeds, MD  HPI:   72 y.o. woman whom we are seeing for evaluation of chronic cough.  Most recent PCP note x 2 reviewed.  Most recent GI note x 2 reviewed.  Cough present for several years.  She reported history of reflux and heartburn.  In the past with treatment of heartburn with famotidine her cough improved.  However more recently her cough came back again despite famotidine.  She was placed on PPI, omeprazole.  Her cough essentially went away.  She then stopped the omeprazole and her cough returned again.  She underwent EGD 01/2023, note reviewed.  This demonstrated a 1 cm hiatal hernia, no esophagitis.  She resumed her PPI, omeprazole 40 mg daily.  Initially at 20 mg without total control of cough but with a higher dose cough essentially now resolved once again.  Denies any significant cough.  In the past he would wake up vomiting in the night without fever or chills or diarrhea.  No further vomiting during the day.  Discussed likely related to severe reflux causing nocturnal vomiting when lying down likely in the setting of hiatal hernia.  Discussed that given her cough reliably improves on PPI and gets worse off PPI that reflux is certainly the most likely etiology of her cough.  Given well-controlled symptoms, no justification for adding additional medications or treatment test at this time.  Reviewed her most recent chest imaging 06/2021 with chief complaint of cough that reveals clear lungs bilaterally with hyperinflation on the lateral and PA view.  Reviewed CT scan chest 08/2017 that on my  review interpretation reveals clear lungs bilaterally, no significant emphysematous changes.  PMH: Tobacco abuse in remission, seasonal allergies, hypertension, GERD Surgical history: Patient reports history of cholecystectomy, hysterectomy, appendectomy, bilateral hip replacements Family history: Father with CAD, CVA Social history: Former smoker, quit 2012, lives in [place], continues to work   Public house manager / Location manager:   ACT:      No data to display          MMRC:     No data to display          Epworth:      No data to display          Tests:   FENO:  No results found for: "NITRICOXIDE"  PFT:     No data to display          WALK:      No data to display          Imaging: Personally reviewed and as per EMR and discussion in this note No results found.  Lab Results: Personally reviewed CBC    Component Value Date/Time   WBC 10.1 11/03/2020 1634   WBC 24.2 (H) 06/16/2020 0731   RBC 4.95 11/03/2020 1634   RBC 3.86 (L) 06/16/2020 0731   HGB 13.7 11/03/2020 1634   HCT 41.3 11/03/2020 1634   PLT 340 11/03/2020 1634   MCV 83 11/03/2020 1634   MCH 27.7 11/03/2020 1634  MCH 28.8 06/16/2020 0731   MCHC 33.2 11/03/2020 1634   MCHC 32.6 06/16/2020 0731   RDW 14.6 11/03/2020 1634   LYMPHSABS 3.6 (H) 11/03/2020 1634   MONOABS 0.9 06/11/2020 1317   EOSABS 0.2 11/03/2020 1634   BASOSABS 0.1 11/03/2020 1634    BMET    Component Value Date/Time   NA 141 11/21/2022 0848   K 3.8 11/21/2022 0848   CL 98 11/21/2022 0848   CO2 24 11/21/2022 0848   GLUCOSE 100 (H) 11/21/2022 0848   GLUCOSE 112 (H) 08/01/2022 0823   BUN 15 11/21/2022 0848   CREATININE 0.81 11/21/2022 0848   CREATININE 0.86 05/07/2016 0859   CALCIUM 10.4 (H) 11/21/2022 0848   GFRNONAA 67 11/03/2020 1634   GFRNONAA 71 11/28/2015 1117   GFRAA 77 11/03/2020 1634   GFRAA 82 11/28/2015 1117    BNP No results found for: "BNP"  ProBNP No results found for:  "PROBNP"  Specialty Problems       Pulmonary Problems   Allergic rhinitis due to pollen   Pulmonary nodules    Allergies  Allergen Reactions   Other    Tramadol Nausea And Vomiting   Codeine Other (See Comments)    Headache   Oxycodone Palpitations    Immunization History  Administered Date(s) Administered   Fluad Quad(high Dose 65+) 06/21/2019   Influenza Split 07/28/2015   Influenza, Seasonal, Injecte, Preservative Fre 09/08/2012, 09/19/2013   Influenza,inj,Quad PF,6+ Mos 05/07/2016, 07/22/2017   Influenza,inj,Quad PF,6-35 Mos 07/27/2022   Influenza-Unspecified 06/26/2019, 06/25/2021   PFIZER(Purple Top)SARS-COV-2 Vaccination 10/27/2019, 11/19/2019   Pneumococcal Conjugate-13 08/12/2017   Pneumococcal Polysaccharide-23 11/09/2013, 11/21/2018   Tdap 11/09/2013   Zoster Recombinat (Shingrix) 06/28/2018, 08/30/2018   Zoster, Live 09/12/2013    Past Medical History:  Diagnosis Date   Allergy    Arthritis    Blood transfusion without reported diagnosis    Cataract    Cholelithiasis    large stone detected by CT 01/2016   GERD (gastroesophageal reflux disease)    Hyperlipidemia    Hypertension    Nephrolithiasis    2mm kidney stone L by CT 01/2016   Osteoporosis    Retinal vein occlusion, 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   Stroke (HCC)    in retina only     Tobacco History: Social History   Tobacco Use  Smoking Status Former   Packs/day: 1.50   Years: 39.00   Additional pack years: 0.00   Total pack years: 58.50   Types: Cigarettes   Start date: 19   Quit date: 09/08/2010   Years since quitting: 12.4  Smokeless Tobacco Never   Counseling given: Not Answered   Continue to not smoke  Outpatient Encounter Medications as of 02/23/2023  Medication Sig   acetaminophen (TYLENOL) 500 MG tablet Take 1,000 mg by mouth every 6 (six) hours as needed for moderate pain.    alendronate (FOSAMAX) 70 MG tablet Take 1 tablet (70 mg total) by mouth once a week.   amLODipine  (NORVASC) 10 MG tablet Take 1 tablet by mouth once daily   aspirin EC 81 MG tablet Take 81 mg by mouth daily. Swallow whole.   Cholecalciferol (VITAMIN D3 PO) Take by mouth.   fluticasone (FLONASE) 50 MCG/ACT nasal spray Place 2 sprays into both nostrils daily.   gabapentin (NEURONTIN) 100 MG capsule Take 1 capsule by mouth at bedtime   hydrochlorothiazide (HYDRODIURIL) 25 MG tablet Take 1 tablet by mouth once daily   naproxen sodium (ALEVE) 220 MG tablet Take 440  mg by mouth 2 (two) times daily as needed (pain).   omeprazole (PRILOSEC) 40 MG capsule Take 1 capsule (40 mg total) by mouth daily.   simvastatin (ZOCOR) 40 MG tablet Take 1 tablet by mouth once daily with breakfast   cetirizine (ZYRTEC ALLERGY) 10 MG tablet Take 1 tablet (10 mg total) by mouth daily. (Patient not taking: Reported on 01/20/2023)   No facility-administered encounter medications on file as of 02/23/2023.     Review of Systems  Review of Systems  No chest pain exertion.  No orthopnea or PND.  Comprehensive review of systems otherwise negative. Physical Exam  BP 122/72   Pulse 70   Temp 97.7 F (36.5 C) (Oral)   Ht 5\' 6"  (1.676 m)   Wt 160 lb 12.8 oz (72.9 kg)   SpO2 98%   BMI 25.95 kg/m   Wt Readings from Last 5 Encounters:  02/23/23 160 lb 12.8 oz (72.9 kg)  01/23/23 161 lb (73 kg)  01/20/23 161 lb (73 kg)  11/21/22 161 lb 9.6 oz (73.3 kg)  08/26/22 159 lb (72.1 kg)    BMI Readings from Last 5 Encounters:  02/23/23 25.95 kg/m  01/23/23 25.99 kg/m  01/20/23 25.99 kg/m  11/21/22 26.08 kg/m  08/26/22 25.66 kg/m     Physical Exam General: Sitting in chair, no acute distress Eyes: EOMI, no icterus Neck: Supple, no JVP Pulmonary clear, no work of breathing EXTR warm, no edema Abdomen: Not think about sounds present MSK: There is no mass, no joint effusion Neuro: Normal gait, no weakness Psych: Normal mood, full affect   Assessment & Plan:   Chronic cough: Present for years.   Reliably improved in the past with H2 blockers for treatment of reflux.  Recurred despite this.  Now improved with daily PPI therapy.  Hiatal hernia on endoscopy.  Heartburn in the past with better symptoms on PPI, nocturnal vomiting in the past no longer occurring on PPI.  Most likely related to laryngeal irritation related to reflux.  She is at high risk of silent reflux in the future, ongoing, and hiatal hernia fortunately with acid suppression her cough has improved.  If her cough were to recur in the future would consider surgical option, Nissen fundoplication.  Of course, happy to evaluate other causes of her cough in the future as well.  Chest x-ray hyperinflation: Suspect related to prior cigarette smoking.  Tobacco abuse in remission: She is congratulated on ongoing abstinence.  She denies any dyspnea.  Cough improved with PPI therapy.  Chest imaging clear in the past.   Return if symptoms worsen or fail to improve.   Karren Burly, MD 02/23/2023   This appointment required 60 minutes of patient care (this includes precharting, chart review, review of results, face-to-face care, etc.).

## 2023-04-10 ENCOUNTER — Other Ambulatory Visit: Payer: Self-pay | Admitting: Family Medicine

## 2023-04-10 DIAGNOSIS — E785 Hyperlipidemia, unspecified: Secondary | ICD-10-CM

## 2023-04-10 DIAGNOSIS — I10 Essential (primary) hypertension: Secondary | ICD-10-CM

## 2023-04-12 LAB — HM MAMMOGRAPHY

## 2023-04-14 ENCOUNTER — Encounter: Payer: Self-pay | Admitting: Family Medicine

## 2023-04-17 ENCOUNTER — Encounter: Payer: Self-pay | Admitting: Nurse Practitioner

## 2023-04-17 ENCOUNTER — Ambulatory Visit (INDEPENDENT_AMBULATORY_CARE_PROVIDER_SITE_OTHER): Payer: PPO | Admitting: Nurse Practitioner

## 2023-04-17 VITALS — BP 116/68 | HR 69 | Temp 97.7°F | Ht 66.0 in | Wt 163.4 lb

## 2023-04-17 DIAGNOSIS — F17211 Nicotine dependence, cigarettes, in remission: Secondary | ICD-10-CM

## 2023-04-17 DIAGNOSIS — E559 Vitamin D deficiency, unspecified: Secondary | ICD-10-CM | POA: Diagnosis not present

## 2023-04-17 DIAGNOSIS — M810 Age-related osteoporosis without current pathological fracture: Secondary | ICD-10-CM | POA: Diagnosis not present

## 2023-04-17 DIAGNOSIS — R918 Other nonspecific abnormal finding of lung field: Secondary | ICD-10-CM

## 2023-04-17 DIAGNOSIS — I10 Essential (primary) hypertension: Secondary | ICD-10-CM | POA: Diagnosis not present

## 2023-04-17 DIAGNOSIS — E785 Hyperlipidemia, unspecified: Secondary | ICD-10-CM | POA: Diagnosis not present

## 2023-04-17 DIAGNOSIS — R7303 Prediabetes: Secondary | ICD-10-CM | POA: Diagnosis not present

## 2023-04-17 DIAGNOSIS — M79651 Pain in right thigh: Secondary | ICD-10-CM

## 2023-04-17 DIAGNOSIS — Z96641 Presence of right artificial hip joint: Secondary | ICD-10-CM

## 2023-04-17 LAB — CBC WITH DIFFERENTIAL/PLATELET
Basophils Absolute: 0.1 10*3/uL (ref 0.0–0.1)
Basophils Relative: 0.7 % (ref 0.0–3.0)
Eosinophils Absolute: 0.3 10*3/uL (ref 0.0–0.7)
Eosinophils Relative: 2.6 % (ref 0.0–5.0)
HCT: 40.4 % (ref 36.0–46.0)
Hemoglobin: 13.7 g/dL (ref 12.0–15.0)
Lymphocytes Relative: 33 % (ref 12.0–46.0)
Lymphs Abs: 3.4 10*3/uL (ref 0.7–4.0)
MCHC: 33.9 g/dL (ref 30.0–36.0)
MCV: 87.4 fl (ref 78.0–100.0)
Monocytes Absolute: 1 10*3/uL (ref 0.1–1.0)
Monocytes Relative: 9.7 % (ref 3.0–12.0)
Neutro Abs: 5.6 10*3/uL (ref 1.4–7.7)
Neutrophils Relative %: 54 % (ref 43.0–77.0)
Platelets: 316 10*3/uL (ref 150.0–400.0)
RBC: 4.62 Mil/uL (ref 3.87–5.11)
RDW: 13.6 % (ref 11.5–15.5)
WBC: 10.4 10*3/uL (ref 4.0–10.5)

## 2023-04-17 LAB — LIPID PANEL
Cholesterol: 206 mg/dL — ABNORMAL HIGH (ref 0–200)
HDL: 50.9 mg/dL (ref >39.00)
NonHDL: 155.33
Total CHOL/HDL Ratio: 4
Triglycerides: 297 mg/dL — ABNORMAL HIGH (ref 0.0–149.0)
VLDL: 59.4 mg/dL — ABNORMAL HIGH (ref 0.0–40.0)

## 2023-04-17 LAB — COMPREHENSIVE METABOLIC PANEL
ALT: 17 U/L (ref 0–35)
AST: 17 U/L (ref 0–37)
Albumin: 4.6 g/dL (ref 3.5–5.2)
Alkaline Phosphatase: 79 U/L (ref 39–117)
BUN: 13 mg/dL (ref 6–23)
CO2: 31 mEq/L (ref 19–32)
Calcium: 10.6 mg/dL — ABNORMAL HIGH (ref 8.4–10.5)
Chloride: 98 mEq/L (ref 96–112)
Creatinine, Ser: 0.83 mg/dL (ref 0.40–1.20)
GFR: 71.16 mL/min (ref >60.00)
Glucose, Bld: 95 mg/dL (ref 70–99)
Potassium: 3.3 mEq/L — ABNORMAL LOW (ref 3.5–5.1)
Sodium: 140 mEq/L (ref 135–145)
Total Bilirubin: 0.4 mg/dL (ref 0.2–1.2)
Total Protein: 7.1 g/dL (ref 6.0–8.3)

## 2023-04-17 LAB — LDL CHOLESTEROL, DIRECT: Direct LDL: 108 mg/dL

## 2023-04-17 LAB — HEMOGLOBIN A1C: Hgb A1c MFr Bld: 5.5 % (ref 4.6–6.5)

## 2023-04-17 LAB — VITAMIN D 25 HYDROXY (VIT D DEFICIENCY, FRACTURES): VITD: 32.47 ng/mL (ref 30.00–100.00)

## 2023-04-17 NOTE — Assessment & Plan Note (Signed)
Chronic, ongoing.  Her last LDL was slightly above goal.  Will have her continue simvastatin 40 mg daily and check lipid panel today and adjust regimen based on results.

## 2023-04-17 NOTE — Assessment & Plan Note (Signed)
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  Will have her continue Fosamax 70 mg daily and recheck DEXA scan next year.

## 2023-04-17 NOTE — Assessment & Plan Note (Addendum)
She has been experiencing intermittent right thigh pain for the last 3 to 4 months.  She can continue to take Tylenol or naproxen as needed for pain.  Stretches given to do daily.  No tenderness on exam.  Will also place referral to orthopedics with her history of total hip replacement.  She is taking gabapentin 100 mg at bedtime for right lower leg pain which helps control this.  Continue this medication.

## 2023-04-17 NOTE — Assessment & Plan Note (Signed)
Chronic, stable.  Most recent A1c was 5.8%.  Will check A1c today.

## 2023-04-17 NOTE — Assessment & Plan Note (Signed)
She has history of pulmonary nodules on prior CT scan.  Will get an updated low-dose CT screening for lung cancer with her smoking cessation history and to follow-up on the nodules.

## 2023-04-17 NOTE — Progress Notes (Signed)
New Patient Visit  BP 116/68 (BP Location: Left Arm)   Pulse 69   Temp 97.7 F (36.5 C)   Ht 5\' 6"  (1.676 m)   Wt 163 lb 6.4 oz (74.1 kg)   SpO2 97%   BMI 26.37 kg/m    Subjective:    Patient ID: Lori Cook, female    DOB: Jul 07, 1951, 72 y.o.   MRN: [id number]  CC: Chief Complaint  Patient presents with   Establish Care    NP. Est. Care, request lab work, concerns with coughing from taking the Prilosec and how it can affect her liver, right upper thigh pain    HPI: Lori Cook is a 72 y.o. female presents for new patient visit to establish care.  Introduced to Publishing rights manager role and practice setting.  All questions answered.  Discussed provider/patient relationship and expectations.  She has a history of GERD. She was taking pepcid however it was not helping. She then saw Dr. Adela Lank with GI and was started on prilosec 40mg  daily. This helped with her reflux, then she stopped it. A few months ago, her coughing returned. She was placed back on prilosec and had an EGD. This showed a small hiatal hernia. GI didn't think that the cough was from GERD and sent her to pulmonology. She went to them and was told it was just her GERD and to continue the prilosec. She needs a refill of this. She states that most of the time this helps with her coughing.   She has a history of hypertension. She is taking amlodipine 10 mg daily and hydrochlorothiazide 25 mg daily.  She has not been checking her blood pressure at home because it overall tends to run normal.  She denies chest pain and shortness of breath.  She has a history of osteoporosis and is currently taking Fosamax 70 mg weekly.  She has a history of hyperlipidemia and is taking simvastatin 40 mg daily.  She states that her cholesterol was slightly elevated a few months ago and would like this rechecked today.  She denies chest pain and shortness of breath.  She has been having right upper thigh pain. She states that  she had a hip replacement in 2021. She states that the pain comes and goes. She will take an aspirin or tylenol which helps. She states that this has been going on for the last 3-4 months. She denies injury. She states that the pain got worse after mowing the yard, however normally with movement it helps the pain. She would like to go back to Dr. Roda Shutters who did her surgery.   Depression and anxiety screen done:     04/17/2023    1:12 PM 11/21/2022    8:12 AM 08/26/2022   12:03 PM 08/20/2021    8:18 AM 05/31/2021    3:36 PM  Depression screen PHQ 2/9  Decreased Interest 0 0 0 0 0  Down, Depressed, Hopeless 0 0 0 0 0  PHQ - 2 Score 0 0 0 0 0  Altered sleeping 0 0     Tired, decreased energy 0 0     Change in appetite 0 0     Feeling bad or failure about yourself  0 0     Trouble concentrating 0 0     Moving slowly or fidgety/restless 0 0     Suicidal thoughts 0 0     PHQ-9 Score 0 0     Difficult doing work/chores Not difficult  at all Not difficult at all         04/17/2023    1:12 PM 11/21/2022    8:12 AM  GAD 7 : Generalized Anxiety Score  Nervous, Anxious, on Edge 0 0  Control/stop worrying 0 0  Worry too much - different things 0 0  Trouble relaxing 0 0  Restless 0 0  Easily annoyed or irritable 0 0  Afraid - awful might happen 0 0  Total GAD 7 Score 0 0  Anxiety Difficulty Not difficult at all Not difficult at all    Past Medical History:  Diagnosis Date   Allergy    Arthritis    Blood transfusion without reported diagnosis    Cataract    Cholelithiasis    large stone detected by CT 01/2016   GERD (gastroesophageal reflux disease)    Hyperlipidemia    Hypertension    Nephrolithiasis    2mm kidney stone L by CT 01/2016   Osteoporosis    Retinal vein occlusion, 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    Past Surgical History:  Procedure Laterality Date   ABDOMINAL HYSTERECTOMY     APPENDECTOMY     CHOLECYSTECTOMY     COLONOSCOPY     [facility]- records purges and destroyed- per  pt Normal    JOINT REPLACEMENT N/A    [employer] 01/08/2021   SPINE SURGERY     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   Procedure: LEFT TOTAL HIP ARTHROPLASTY ANTERIOR APPROACH;  Surgeon: Tarry Kos, MD;  Location: [facility] OR;  Service: Orthopedics;  Laterality: Left;   TOTAL HIP ARTHROPLASTY Right 06/15/2020   Procedure: RIGHT TOTAL HIP ARTHROPLASTY ANTERIOR APPROACH;  Surgeon: Tarry Kos, MD;  Location: [facility] OR;  Service: Orthopedics;  Laterality: Right;    Family History  Problem Relation Age of Onset   Mental illness Mother    Stroke Father    Diabetes Father    Heart disease Father 91       AMI   Hypertension Sister    Hypertension Brother    Arthritis Brother    ADD / ADHD Daughter    Heart disease Paternal Uncle    Colon cancer Neg Hx    Colon polyps Neg Hx    Esophageal cancer Neg Hx    Rectal cancer Neg Hx    Stomach cancer Neg Hx      Social History   Tobacco Use   Smoking status: Former    Current packs/day: 0.00    Average packs/day: 1.5 packs/day for 40.0 years (60.0 ttl pk-yrs)    Types: Cigarettes    Start date: 24    Quit date: 09/08/2010    Years since quitting: 12.6   Smokeless tobacco: Never  Vaping Use   Vaping status: Never Used  Substance Use Topics   Alcohol use: No   Drug use: Never    Current Outpatient Medications on File Prior to Visit  Medication Sig Dispense Refill   acetaminophen (TYLENOL) 500 MG tablet Take 1,000 mg by mouth every 6 (six) hours as needed for moderate pain.      alendronate (FOSAMAX) 70 MG tablet Take 1 tablet (70 mg total) by mouth once a week. 12 tablet 3   amLODipine (NORVASC) 10 MG tablet Take 1 tablet by mouth once daily 90 tablet 0   aspirin EC 81 MG tablet Take 81 mg by mouth daily. Swallow whole.     Cholecalciferol (VITAMIN D3 PO) Take by mouth.     fluticasone (  FLONASE) 50 MCG/ACT nasal spray Place 2 sprays into both nostrils daily. 1 g 0   gabapentin (NEURONTIN) 100 MG capsule Take 1 capsule by mouth at  bedtime 90 capsule 0   hydrochlorothiazide (HYDRODIURIL) 25 MG tablet Take 1 tablet by mouth once daily 90 tablet 0   naproxen sodium (ALEVE) 220 MG tablet Take 440 mg by mouth 2 (two) times daily as needed (pain).     omeprazole (PRILOSEC) 40 MG capsule Take 1 capsule (40 mg total) by mouth daily. 90 capsule 1   simvastatin (ZOCOR) 40 MG tablet Take 1 tablet by mouth once daily with breakfast 90 tablet 0   No current facility-administered medications on file prior to visit.     Review of Systems  Constitutional: Negative.   HENT: Negative.    Respiratory:  Positive for cough. Negative for shortness of breath.   Cardiovascular: Negative.   Gastrointestinal: Negative.   Genitourinary: Negative.   Musculoskeletal:  Positive for arthralgias.  Skin: Negative.   Neurological: Negative.   Psychiatric/Behavioral: Negative.          Objective:    BP 116/68 (BP Location: Left Arm)   Pulse 69   Temp 97.7 F (36.5 C)   Ht 5\' 6"  (1.676 m)   Wt 163 lb 6.4 oz (74.1 kg)   SpO2 97%   BMI 26.37 kg/m   Wt Readings from Last 3 Encounters:  04/17/23 163 lb 6.4 oz (74.1 kg)  02/23/23 160 lb 12.8 oz (72.9 kg)  01/23/23 161 lb (73 kg)    BP Readings from Last 3 Encounters:  04/17/23 116/68  02/23/23 122/72  01/23/23 117/64    Physical Exam Vitals and nursing note reviewed.  Constitutional:      General: She is not in acute distress.    Appearance: Normal appearance.  HENT:     Head: Normocephalic and atraumatic.     Right Ear: Tympanic membrane, ear canal and external ear normal.     Left Ear: Tympanic membrane, ear canal and external ear normal.  Eyes:     Conjunctiva/sclera: Conjunctivae normal.  Cardiovascular:     Rate and Rhythm: Normal rate and regular rhythm.     Pulses: Normal pulses.     Heart sounds: Normal heart sounds.  Pulmonary:     Effort: Pulmonary effort is normal.     Breath sounds: Normal breath sounds.  Abdominal:     Palpations: Abdomen is soft.      Tenderness: There is no abdominal tenderness.  Musculoskeletal:        General: No swelling or tenderness. Normal range of motion.     Cervical back: Normal range of motion and neck supple.     Right lower leg: No edema.     Left lower leg: No edema.  Lymphadenopathy:     Cervical: No cervical adenopathy.  Skin:    General: Skin is warm and dry.  Neurological:     General: No focal deficit present.     Mental Status: She is alert and oriented to person, place, and time.     Cranial Nerves: No cranial nerve deficit.     Coordination: Coordination normal.     Gait: Gait normal.  Psychiatric:        Mood and Affect: Mood normal.        Behavior: Behavior normal.        Thought Content: Thought content normal.        Judgment: Judgment normal.  Assessment & Plan:   Problem List Items Addressed This Visit       Cardiovascular and Mediastinum   Essential hypertension, benign - Primary    Chronic, stable.  Continue amlodipine 10 mg daily and HCTZ 25 mg daily.  Check CMP, CBC today.  Follow-up in 6 months.      Relevant Orders   CBC with Differential/Platelet   Comprehensive metabolic panel     Respiratory   Pulmonary nodules    She has history of pulmonary nodules on prior CT scan.  Will get an updated low-dose CT screening for lung cancer with her smoking cessation history and to follow-up on the nodules.        Musculoskeletal and Integument   Osteoporosis without current pathological fracture    Most recent DEXA on 12/24/2021 showed a T-score -3 in her wrist, however this area was not evaluated in 2020.  Will have her continue Fosamax 70 mg daily and recheck DEXA scan next year.        Other   Hyperlipidemia with target LDL less than 130    Chronic, ongoing.  Her last LDL was slightly above goal.  Will have her continue simvastatin 40 mg daily and check lipid panel today and adjust regimen based on results.      Relevant Orders   CBC with Differential/Platelet    Comprehensive metabolic panel   Lipid panel   Vitamin D insufficiency    She is currently taking a daily vitamin D supplement over-the-counter.  Will check levels and adjust regimen based on results.      Relevant Orders   VITAMIN D 25 Hydroxy (Vit-D Deficiency, Fractures)   Status post total replacement of right hip    She has a history of bilateral hip replacements.  She is having some pain to her right upper thigh around her incision that goes across her leg.  Will place referral back to orthopedics.      Prediabetes    Chronic, stable.  Most recent A1c was 5.8%.  Will check A1c today.      Relevant Orders   Hemoglobin A1c   Cigarette nicotine dependence in remission    She has a 40-pack-year history of smoking and quit smoking 10 years ago.  Congratulated her on this.  Will check a low-dose CT scan to screen for lung cancer.      Relevant Orders   CT CHEST LUNG CANCER SCREENING LOW DOSE WO CONTRAST   Right thigh pain    She has been experiencing intermittent right thigh pain for the last 3 to 4 months.  She can continue to take Tylenol or naproxen as needed for pain.  Stretches given to do daily.  No tenderness on exam.  Will also place referral to orthopedics with her history of total hip replacement.  She is taking gabapentin 100 mg at bedtime for right lower leg pain which helps control this.  Continue this medication.      Relevant Orders   Ambulatory referral to Orthopedic Surgery     Follow up plan: Return in about 6 months (around 10/18/2023) for htn, hld.  Chelsea Pedretti [employer]

## 2023-04-17 NOTE — Assessment & Plan Note (Signed)
She is currently taking a daily vitamin D supplement over-the-counter.  Will check levels and adjust regimen based on results.

## 2023-04-17 NOTE — Assessment & Plan Note (Signed)
Chronic, stable.  Continue amlodipine 10 mg daily and HCTZ 25 mg daily.  Check CMP, CBC today.  Follow-up in 6 months.

## 2023-04-17 NOTE — Assessment & Plan Note (Signed)
She has a history of bilateral hip replacements.  She is having some pain to her right upper thigh around her incision that goes across her leg.  Will place referral back to orthopedics.

## 2023-04-17 NOTE — Assessment & Plan Note (Signed)
She has a 40-pack-year history of smoking and quit smoking 10 years ago.  Congratulated her on this.  Will check a low-dose CT scan to screen for lung cancer.

## 2023-04-17 NOTE — Patient Instructions (Signed)
It was great to see you!  We are checking your labs today and will let you know the results via mychart/phone.   I have ordered a low dose CT scan of your lungs with your smoking history.   I have placed a referral to Dr. Roda Shutters with ortho for your leg. I have attached stretches to do daily as well.   Let's follow-up in 6 months, sooner if you have concerns.  If a referral was placed today, you will be contacted for an appointment. Please note that routine referrals can sometimes take up to 3-4 weeks to process. Please call our office if you haven't heard anything after this time frame.  Take care,  Rodman Pickle, NP

## 2023-04-27 ENCOUNTER — Other Ambulatory Visit: Payer: Self-pay | Admitting: Family Medicine

## 2023-04-27 ENCOUNTER — Other Ambulatory Visit: Payer: Self-pay

## 2023-04-27 DIAGNOSIS — M79604 Pain in right leg: Secondary | ICD-10-CM

## 2023-04-27 MED ORDER — GABAPENTIN 100 MG PO CAPS
100.0000 mg | ORAL_CAPSULE | Freq: Every day | ORAL | 1 refills | Status: DC
Start: 2023-04-27 — End: 2023-11-17

## 2023-04-27 NOTE — Telephone Encounter (Signed)
Requesting: Gabapentin 100mg  Last Visit: 04/17/2023 Next Visit: 10/23/2023 Last Refill: 02/02/2023 by Georganna Skeans  Please Advise

## 2023-05-02 ENCOUNTER — Other Ambulatory Visit (INDEPENDENT_AMBULATORY_CARE_PROVIDER_SITE_OTHER): Payer: PPO

## 2023-05-02 ENCOUNTER — Encounter: Payer: Self-pay | Admitting: Orthopaedic Surgery

## 2023-05-02 ENCOUNTER — Ambulatory Visit: Payer: PPO | Admitting: Orthopaedic Surgery

## 2023-05-02 DIAGNOSIS — M25551 Pain in right hip: Secondary | ICD-10-CM | POA: Diagnosis not present

## 2023-05-02 MED ORDER — PREDNISONE 10 MG (21) PO TBPK: ORAL_TABLET | ORAL | 3 refills | Status: DC

## 2023-05-02 NOTE — Progress Notes (Signed)
Office Visit Note   Patient: Lori Cook           Date of Birth: June 02, 1951           MRN: [id number] Visit Date: 05/02/2023              Requested by: Gerre Scull, NP [address] [place],  [place] [address] PCP: Gerre Scull, NP   Assessment & Plan: Visit Diagnoses:  1. Pain in right hip     Plan: Lori Cook is a 72 year old female with right hip pain.  Impression is hip flexor overuse and inflammation.  She is quite active as a Financial trader.  I recommend relative rest and benefit to be modification.  She will take the Aleve and she would like to try a prednisone Dosepak.  Follow-up as needed.  Follow-Up Instructions: No follow-ups on file.   Orders:  Orders Placed This Encounter  Procedures   XR HIP UNILAT W OR W/O PELVIS 2-3 VIEWS RIGHT   No orders of the defined types were placed in this encounter.     Procedures: No procedures performed   Clinical Data: No additional findings.   Subjective: Chief Complaint  Patient presents with   Right Hip - Pain    HPI Lori Cook is a 72 year old female who comes in for evaluation of right upper thigh pain for the last 2 to 3 months.  Denies any injuries.  Feels pain when she is driving.  Underwent a right total hip replacement in 2021.  She did not have any postoperative complications.  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  Physical Exam  Ortho Exam Examination of the right hip shows fully healed surgical scar.  She has fluid painless range of motion.  She has discomfort that is reproducible with resisted hip flexion. Specialty Comments:  No specialty comments available.  Imaging: XR HIP UNILAT W OR W/O PELVIS 2-3 VIEWS RIGHT  Result Date: 05/02/2023 Stable right total hip replacement without complication.    PMFS History: Patient Active Problem List   Diagnosis Date Noted   Cigarette nicotine  dependence in remission 04/17/2023   Right thigh pain 04/17/2023   Prediabetes 02/21/2021   Status post total replacement of right hip 06/15/2020   Primary osteoarthritis of right hip 06/14/2020   Vitamin D insufficiency 01/09/2020   Osteoporosis without current pathological fracture 01/07/2020   Hypercalcemia 01/07/2020   Status post total replacement of left hip 11/25/2019   Calculus of gallbladder without cholecystitis without obstruction 05/07/2016   Nephrolithiasis 05/07/2016   Pulmonary nodules 05/07/2016   Hematuria, microscopic 05/07/2016   Essential hypertension, benign 04/24/2015   Allergic rhinitis due to pollen 04/24/2015   Retinal vein occlusion, branch 04/24/2015   Retinal vein occlusion 03/08/2015   Essential hypertension-new onset 03/08/2015   Hyperlipidemia with target LDL less than 130 09/09/2012   Past Medical History:  Diagnosis Date   Allergy    Arthritis    Blood transfusion without reported diagnosis    Cataract    Cholelithiasis    large stone detected by CT 01/2016   GERD (gastroesophageal reflux disease)    Hyperlipidemia    Hypertension    Nephrolithiasis    2mm kidney stone L by CT 01/2016   Osteoporosis    Retinal vein occlusion, 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    Family History  Problem Relation Age of Onset   Mental illness Mother  Stroke Father    Diabetes Father    Heart disease Father 86       AMI   Hypertension Sister    Hypertension Brother    Arthritis Brother    ADD / ADHD Daughter    Heart disease Paternal Uncle    Colon cancer Neg Hx    Colon polyps Neg Hx    Esophageal cancer Neg Hx    Rectal cancer Neg Hx    Stomach cancer Neg Hx     Past Surgical History:  Procedure Laterality Date   ABDOMINAL HYSTERECTOMY     APPENDECTOMY     CHOLECYSTECTOMY     COLONOSCOPY     [facility]- records purges and destroyed- per pt Normal    JOINT REPLACEMENT N/A    [employer] 01/08/2021   SPINE SURGERY     TOTAL HIP ARTHROPLASTY Left  11/25/2019   Procedure: LEFT TOTAL HIP ARTHROPLASTY ANTERIOR APPROACH;  Surgeon: Tarry Kos, MD;  Location: [facility] OR;  Service: Orthopedics;  Laterality: Left;   TOTAL HIP ARTHROPLASTY Right 06/15/2020   Procedure: RIGHT TOTAL HIP ARTHROPLASTY ANTERIOR APPROACH;  Surgeon: Tarry Kos, MD;  Location: [facility] OR;  Service: Orthopedics;  Laterality: Right;   Social History   Occupational History   Not on file  Tobacco Use   Smoking status: Former    Current packs/day: 0.00    Average packs/day: 1.5 packs/day for 40.0 years (60.0 ttl pk-yrs)    Types: Cigarettes    Start date: 54    Quit date: 09/08/2010    Years since quitting: 12.6   Smokeless tobacco: Never  Vaping Use   Vaping status: Never Used  Substance and Sexual Activity   Alcohol use: No   Drug use: Never   Sexual activity: Not Currently

## 2023-05-05 ENCOUNTER — Ambulatory Visit
Admission: RE | Admit: 2023-05-05 | Discharge: 2023-05-05 | Disposition: A | Discharge status: Home / Self Care | Payer: PPO | Source: Ambulatory Visit | Attending: Nurse Practitioner | Admitting: Nurse Practitioner

## 2023-05-05 DIAGNOSIS — F17211 Nicotine dependence, cigarettes, in remission: Secondary | ICD-10-CM

## 2023-05-29 ENCOUNTER — Ambulatory Visit: Payer: PPO | Admitting: Family Medicine

## 2023-06-12 ENCOUNTER — Ambulatory Visit (INDEPENDENT_AMBULATORY_CARE_PROVIDER_SITE_OTHER): Payer: PPO

## 2023-06-12 DIAGNOSIS — Z0000 Encounter for general adult medical examination without abnormal findings: Secondary | ICD-10-CM

## 2023-06-12 NOTE — Patient Instructions (Signed)
Lori Cook , Thank you for taking time to come for your Medicare Wellness Visit. I appreciate your ongoing commitment to your health goals. Please review the following plan we discussed and let me know if I can assist you in the future.   Referrals/Orders/Follow-Ups/Clinician Recommendations: none  This is a list of the screening recommended for you and due dates:  Health Maintenance  Topic Date Due   Flu Shot  12/11/2023*   DTaP/Tdap/Td vaccine (3 - Td or Tdap) 11/10/2023   Screening for Lung Cancer  05/04/2024   Medicare Annual Wellness Visit  06/11/2024   Mammogram  04/13/2025   Colon Cancer Screening  10/29/2030   Pneumonia Vaccine  Completed   DEXA scan (bone density measurement)  Completed   Hepatitis C Screening  Completed   Zoster (Shingles) Vaccine  Completed   HPV Vaccine  Aged Out   COVID-19 Vaccine  Discontinued  *Topic was postponed. The date shown is not the original due date.    Advanced directives: (ACP Link)Information on Advanced Care Planning can be found at [facility] of [place] Advance Health Care Directives Advance Health Care Directives ([email or web address]   Next Medicare Annual Wellness Visit scheduled for next year: Yes  Insert Preventive Care attachment Insert FALL PREVENTION attachment if needed

## 2023-06-12 NOTE — Progress Notes (Signed)
Subjective:   Lori Cook is a 72 y.o. female who presents for Medicare Annual (Subsequent) preventive examination.  Visit Complete: Virtual  I connected with  Lori Cook on 06/12/23 by a audio enabled telemedicine application and verified that I am speaking with the correct person using two identifiers.  Patient Location: Home  Provider Location: Office/Clinic  I discussed the limitations of evaluation and management by telemedicine. The patient expressed understanding and agreed to proceed.  Patient Medicare AWV questionnaire was completed by the patient on 06/08/2023; I have confirmed that all information answered by patient is correct and no changes since this date.  Because this visit was a virtual/telehealth visit, some criteria may be missing or patient reported. Any vitals not documented were not able to be obtained and vitals that have been documented are patient reported.   Cardiac Risk Factors include: advanced age (>85men, >61 women);dyslipidemia;hypertension     Objective:    Today's Vitals   There is no height or weight on file to calculate BMI.     06/12/2023    2:00 PM 08/26/2022   12:02 PM 02/22/2021    9:02 AM 06/15/2020    2:20 PM 06/15/2020    8:41 AM 06/11/2020    1:03 PM 04/06/2020   10:29 PM  Advanced Directives  Does Patient Have a Medical Advance Directive? No No No No No No No  Would patient like information on creating a medical advance directive?   Yes (Inpatient - patient defers creating a medical advance directive at this time - Information given) No - Patient declined       Current Medications (verified) Outpatient Encounter Medications as of 06/12/2023  Medication Sig   acetaminophen (TYLENOL) 500 MG tablet Take 1,000 mg by mouth every 6 (six) hours as needed for moderate pain.    alendronate (FOSAMAX) 70 MG tablet Take 1 tablet (70 mg total) by mouth once a week.   amLODipine (NORVASC) 10 MG tablet Take 1 tablet by mouth once daily    aspirin EC 81 MG tablet Take 81 mg by mouth daily. Swallow whole.   Cholecalciferol (VITAMIN D3 PO) Take by mouth.   fluticasone (FLONASE) 50 MCG/ACT nasal spray Place 2 sprays into both nostrils daily.   gabapentin (NEURONTIN) 100 MG capsule Take 1 capsule (100 mg total) by mouth at bedtime.   hydrochlorothiazide (HYDRODIURIL) 25 MG tablet Take 1 tablet by mouth once daily   naproxen sodium (ALEVE) 220 MG tablet Take 440 mg by mouth 2 (two) times daily as needed (pain).   omeprazole (PRILOSEC) 40 MG capsule Take 1 capsule (40 mg total) by mouth daily.   simvastatin (ZOCOR) 40 MG tablet Take 1 tablet by mouth once daily with breakfast   predniSONE (STERAPRED UNI-PAK 21 TAB) 10 MG (21) TBPK tablet Take as directed   No facility-administered encounter medications on file as of 06/12/2023.    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   Past Surgical History:  Procedure Laterality Date   ABDOMINAL HYSTERECTOMY  APPENDECTOMY     CHOLECYSTECTOMY     COLONOSCOPY     [facility]- records purges and destroyed- per pt Normal    JOINT REPLACEMENT N/A    [employer] 01/08/2021   SPINE SURGERY     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   Procedure: LEFT TOTAL HIP ARTHROPLASTY ANTERIOR APPROACH;  Surgeon: Tarry Kos, MD;  Location: [facility];  Service: Orthopedics;  Laterality: Left;   TOTAL HIP ARTHROPLASTY Right 06/15/2020   Procedure: RIGHT TOTAL HIP ARTHROPLASTY ANTERIOR APPROACH;  Surgeon: Tarry Kos, MD;  Location: [facility] OR;  Service: Orthopedics;  Laterality: Right;   Family History   Problem Relation Age of Onset   Mental illness Mother    Stroke Father    Diabetes Father    Heart disease Father 65       AMI   Hypertension Sister    Hypertension Brother    Arthritis Brother    ADD / ADHD Daughter    Heart disease Paternal Uncle    Colon cancer Neg Hx    Colon polyps Neg Hx    Esophageal cancer Neg Hx    Rectal cancer Neg Hx    Stomach cancer Neg Hx    Social History   Socioeconomic History   Marital status: Single    Spouse name: Not on file   Number of children: Not on file   Years of education: Not on file   Highest education level: Not on file  Occupational History   Not on file  Tobacco Use   Smoking status: Former    Current packs/day: 0.00    Average packs/day: 1.5 packs/day for 40.0 years (60.0 ttl pk-yrs)    Types: Cigarettes    Start date: 89    Quit date: 09/08/2010    Years since quitting: 12.7   Smokeless tobacco: Never  Vaping Use   Vaping status: Never Used  Substance and Sexual Activity   Alcohol use: No   Drug use: Never   Sexual activity: Not Currently  Other Topics Concern   Not on file  Social History Narrative   Not on file   Social Determinants of Health   Financial Resource Strain: Low Risk  (06/08/2023)   Overall Financial Resource Strain (CARDIA)    Difficulty of Paying Living Expenses: Not hard at all  Food Insecurity: No Food Insecurity (06/08/2023)   Hunger Vital Sign    Worried About Running Out of Food in the Last Year: Never true    Ran Out of Food in the Last Year: Never true  Transportation Needs: No Transportation Needs (06/08/2023)   PRAPARE - Administrator, Civil Service (Medical): No    Lack of Transportation (Non-Medical): No  Physical Activity: Sufficiently Active (06/08/2023)   Exercise Vital Sign    Days of Exercise per Week: 6 days    Minutes of Exercise per Session: 150+ min  Stress: No Stress Concern Present (06/08/2023)   [employer] of Occupational Health - Occupational  Stress Questionnaire    Feeling of Stress : Not at all  Social Connections: Unknown (06/08/2023)   Social Connection and Isolation Panel [NHANES]    Frequency of Communication with Friends and Family: Three times a week    Frequency of Social Gatherings with Friends and Family: Twice a week    Attends Religious Services: Not on file    Active Member of Clubs or Organizations: Yes    Attends Banker Meetings: More than 4  times per year    Marital Status: Divorced    Tobacco Counseling Counseling given: Not Answered   Clinical Intake:  Pre-visit preparation completed: Yes  Pain : No/denies pain     Nutritional Risks: None Diabetes: No  How often do you need to have someone help you when you read instructions, pamphlets, or other written materials from your doctor or pharmacy?: 1 - Never  Interpreter Needed?: No  Information entered by :: NAllen LPN   Activities of Daily Living    06/08/2023    7:56 PM 08/26/2022   12:03 PM  In your present state of health, do you have any difficulty performing the following activities:  Hearing? 0 0  Vision? 0 0  Difficulty concentrating or making decisions? 0 0  Walking or climbing stairs? 0 0  Dressing or bathing? 0 0  Doing errands, shopping? 0 0  Preparing Food and eating ? N N  Using the Toilet? N N  In the past six months, have you accidently leaked urine? N Y  Do you have problems with loss of bowel control? N N  Managing your Medications? N N  Managing your Finances? N N  Housekeeping or managing your Housekeeping? N N    Patient Care Team: Gerre Scull, NP as PCP - General (Internal Medicine)  Indicate any recent Medical Services you may have received from other than [facility] providers in the past year (date may be approximate).     Assessment:   This is a routine wellness examination for [place].  Hearing/Vision screen Hearing Screening - Comments:: Denies hearing issues Vision Screening - Comments::  Regular eye exams, [facility]   Goals Addressed             This Visit's Progress    Patient Stated       06/12/2023, wants to lose 5 pounds       Depression Screen    06/12/2023    2:01 PM 04/17/2023    1:12 PM 11/21/2022    8:12 AM 08/26/2022   12:03 PM 08/20/2021    8:18 AM 05/31/2021    3:36 PM 02/22/2021    9:03 AM  PHQ 2/9 Scores  PHQ - 2 Score 0 0 0 0 0 0 0  PHQ- 9 Score 0 0 0    0    Fall Risk    06/08/2023    7:56 PM 04/17/2023    1:11 PM 08/26/2022   12:02 PM 08/25/2022   12:24 PM 08/22/2022    7:36 PM  Fall Risk   Falls in the past year? 0 0 0 0 0  Number falls in past yr: 0 0 0 0 0  Injury with Fall? 0 0 0 0 0  Risk for fall due to : Medication side effect No Fall Risks Medication side effect    Follow up Falls prevention discussed;Falls evaluation completed Falls evaluation completed Falls prevention discussed;Education provided;Falls evaluation completed      MEDICARE RISK AT HOME: Medicare Risk at Home Any stairs in or around the home?: Yes If so, are there any without handrails?: No Home free of loose throw rugs in walkways, pet beds, electrical cords, etc?: Yes Adequate lighting in your home to reduce risk of falls?: Yes Life alert?: No Use of a cane, walker or w/c?: No Grab bars in the bathroom?: Yes Shower chair or bench in shower?: No Elevated toilet seat or a handicapped toilet?: Yes  TIMED UP AND GO:  Was the  test performed?  No    Cognitive Function:    02/22/2021    9:03 AM  MMSE - Mini Mental State Exam  Orientation to time 5  Orientation to Place 5  Registration 3  Attention/ Calculation 5  Recall 3  Language- name 2 objects 2  Language- repeat 1  Language- follow 3 step command 3  Language- read & follow direction 1  Write a sentence 1  Copy design 1  Total score 30        06/12/2023    2:01 PM 08/26/2022   12:04 PM 12/23/2019   11:13 AM 11/21/2018    3:59 PM  6CIT Screen  What Year? 0 points 0 points 0 points    What month? 0 points 0 points 0 points 0 points  What time? 0 points 0 points 0 points   Count back from 20 0 points 0 points 0 points 0 points  Months in reverse 0 points 0 points 0 points 0 points  Repeat phrase 2 points 4 points 0 points 0 points  Total Score 2 points 4 points 0 points     Immunizations Immunization History  Administered Date(s) Administered   Fluad Quad(high Dose 65+) 06/21/2019   Influenza Split 07/28/2015   Influenza, Seasonal, Injecte, Preservative Fre 09/08/2012, 09/19/2013   Influenza,inj,Quad PF,6+ Mos 05/07/2016, 07/22/2017   Influenza,inj,Quad PF,6-35 Mos 07/27/2022   Influenza-Unspecified 06/26/2019, 06/25/2021   PFIZER(Purple Top)SARS-COV-2 Vaccination 10/27/2019, 11/19/2019   Pneumococcal Conjugate-13 08/12/2017   Pneumococcal Polysaccharide-23 11/09/2013, 11/21/2018   Td 03/12/1980   Tdap 11/09/2013   Zoster Recombinant(Shingrix) 06/28/2018, 08/30/2018   Zoster, Live 09/12/2013    TDAP status: Up to date  Flu Vaccine status: Due, Education has been provided regarding the importance of this vaccine. Advised may receive this vaccine at local pharmacy or Health Dept. Aware to provide a copy of the vaccination record if obtained from local pharmacy or Health Dept. Verbalized acceptance and understanding.  Pneumococcal vaccine status: Up to date  Covid-19 vaccine status: Information provided on how to obtain vaccines.   Qualifies for Shingles Vaccine? Yes   Zostavax completed Yes   Shingrix Completed?: Yes  Screening Tests Health Maintenance  Topic Date Due   INFLUENZA VACCINE  12/11/2023 (Originally 04/13/2023)   DTaP/Tdap/Td (3 - Td or Tdap) 11/10/2023   Lung Cancer Screening  05/04/2024   Medicare Annual Wellness (AWV)  06/11/2024   MAMMOGRAM  04/13/2025   Colonoscopy  10/29/2030   Pneumonia Vaccine 57+ Years old  Completed   DEXA SCAN  Completed   Hepatitis C Screening  Completed   Zoster Vaccines- Shingrix  Completed   HPV VACCINES   Aged Out   COVID-19 Vaccine  Discontinued    Health Maintenance  There are no preventive care reminders to display for this patient.  Colorectal cancer screening: No longer required.   Mammogram status: Completed 04/14/2023. Repeat every year  Bone Density status: Completed 12/24/2021.   Lung Cancer Screening: (Low Dose CT Chest recommended if Age 53-80 years, 20 pack-year currently smoking OR have quit w/in 15years.) does not qualify.   Lung Cancer Screening Referral: no  Additional Screening:  Hepatitis C Screening: does qualify; Completed 05/07/2016  Vision Screening: Recommended annual ophthalmology exams for early detection of glaucoma and other disorders of the eye. Is the patient up to date with their annual eye exam?  Yes  Who is the provider or what is the name of the office in which the patient attends annual eye exams? [facility]  If pt is not established with a provider, would they like to be referred to a provider to establish care? No .   Dental Screening: Recommended annual dental exams for proper oral hygiene  Diabetic Foot Exam: n/a  Community Resource Referral / Chronic Care Management: CRR required this visit?  No   CCM required this visit?  No     Plan:     I have personally reviewed and noted the following in the patient's chart:   Medical and social history Use of alcohol, tobacco or illicit drugs  Current medications and supplements including opioid prescriptions. Patient is not currently taking opioid prescriptions. Functional ability and status Nutritional status Physical activity Advanced directives List of other physicians Hospitalizations, surgeries, and ER visits in previous 12 months Vitals Screenings to include cognitive, depression, and falls Referrals and appointments  In addition, I have reviewed and discussed with patient certain preventive protocols, quality metrics, and best practice recommendations. A written personalized care  plan for preventive services as well as general preventive health recommendations were provided to patient.     Barb Merino, LPN   4/54/0981   After Visit Summary: (MyChart) Due to this being a telephonic visit, the after visit summary with patients personalized plan was offered to patient via MyChart   Nurse Notes: none

## 2023-07-07 ENCOUNTER — Encounter: Payer: Self-pay | Admitting: Nurse Practitioner

## 2023-07-07 DIAGNOSIS — I10 Essential (primary) hypertension: Secondary | ICD-10-CM

## 2023-07-07 DIAGNOSIS — E785 Hyperlipidemia, unspecified: Secondary | ICD-10-CM

## 2023-07-10 MED ORDER — HYDROCHLOROTHIAZIDE 25 MG PO TABS
25.0000 mg | ORAL_TABLET | Freq: Every day | ORAL | 1 refills | Status: DC
Start: 2023-07-10 — End: 2024-01-12

## 2023-07-10 MED ORDER — AMLODIPINE BESYLATE 10 MG PO TABS
10.0000 mg | ORAL_TABLET | Freq: Every day | ORAL | 1 refills | Status: DC
Start: 2023-07-10 — End: 2024-01-12

## 2023-07-10 MED ORDER — SIMVASTATIN 40 MG PO TABS
ORAL_TABLET | ORAL | 1 refills | Status: DC
Start: 2023-07-10 — End: 2024-01-12

## 2023-07-21 MED ORDER — OMEPRAZOLE 40 MG PO CPDR: 40.0000 mg | DELAYED_RELEASE_CAPSULE | Freq: Every day | ORAL | 1 refills | Status: DC

## 2023-07-21 NOTE — Addendum Note (Signed)
Addended by: Malena Peer Y on: 07/21/2023 11:53 AM   Modules accepted: Orders

## 2023-07-21 NOTE — Telephone Encounter (Signed)
Requesting: Omeprazole Last Visit: 04/17/2023 Next Visit: 10/23/2023 Last Refill: 01/20/2023 by Dr. Ileene Patrick  Please Advise

## 2023-07-31 ENCOUNTER — Encounter: Payer: Self-pay | Admitting: Internal Medicine

## 2023-07-31 ENCOUNTER — Ambulatory Visit: Payer: PPO | Admitting: Internal Medicine

## 2023-07-31 VITALS — BP 120/80 | HR 68 | Ht 66.0 in | Wt 160.0 lb

## 2023-07-31 DIAGNOSIS — E210 Primary hyperparathyroidism: Secondary | ICD-10-CM | POA: Insufficient documentation

## 2023-07-31 DIAGNOSIS — E559 Vitamin D deficiency, unspecified: Secondary | ICD-10-CM

## 2023-07-31 DIAGNOSIS — M810 Age-related osteoporosis without current pathological fracture: Secondary | ICD-10-CM

## 2023-07-31 LAB — VITAMIN D 25 HYDROXY (VIT D DEFICIENCY, FRACTURES): VITD: 33.61 ng/mL (ref 30.00–100.00)

## 2023-07-31 LAB — ALBUMIN: Albumin: 4.6 g/dL (ref 3.5–5.2)

## 2023-07-31 LAB — BASIC METABOLIC PANEL
BUN: 18 mg/dL (ref 6–23)
CO2: 29 meq/L (ref 19–32)
Calcium: 10.7 mg/dL — ABNORMAL HIGH (ref 8.4–10.5)
Chloride: 99 meq/L (ref 96–112)
Creatinine, Ser: 0.77 mg/dL (ref 0.40–1.20)
GFR: 77.7 mL/min (ref >60.00)
Glucose, Bld: 112 mg/dL — ABNORMAL HIGH (ref 70–99)
Potassium: 3.6 meq/L (ref 3.5–5.1)
Sodium: 140 meq/L (ref 135–145)

## 2023-07-31 MED ORDER — ALENDRONATE SODIUM 70 MG PO TABS: 70.0000 mg | ORAL_TABLET | ORAL | 3 refills | Status: DC

## 2023-07-31 NOTE — Progress Notes (Unsigned)
Name: Lori Cook  MRN/ DOB: [id number], 01/29/1951    Age/ Sex: 72 y.o., female     PCP: Gerre Scull, NP   Reason for Endocrinology Evaluation: Hypercalcemia      Initial Endocrinology Clinic Visit: 01/08/2020    PATIENT IDENTIFIER: Lori Cook is a 72 y.o., female with a past medical history of Osteopenia and dyslipidemia. She has followed with [facility] since 01/08/2020 for consultative assistance with management of her hypercalcemia.   HISTORICAL SUMMARY: The patient was first diagnosed with intermittent hypercalcemia since 2016  with serum calcium os 10.5 mg/dL ( Corrected calcium 7.82) and again in 2019 , serum calcium 10.4 ( corrected 9.84 mg/dL ) and in 05/5620 at 30.8 ( Corrected 10.32).  Her PTH has been fluctuating as high as 75 pg/mL .  She has a hx of renal stones  She has low bone density on DXA ( 10/24/2018)  As well as slip and fall wrist fracture  She was started on Alendronate in 12/2019 due to clinical osteoporosis   DXA 12/24/2021 showed osteoporosis at the distal radius with a T score of -3.0  HCTZ was stopped in 10/2019 but was back on it by 2022 due to LE edema   24-hr urine Ca/Cr ratio of 0.019 with calcium of 224 mg, repeat low at 28 mcg 2023 while on hydrochlorothiazide   SUBJECTIVE:     Today (07/31/2023):  Ms. Unsworth is here for hypercalcemia and osteoporosis.   Denies polydipsia, no polyuria  Has GERD in the form of cough, with hiatal hernia, controlled on PPI  Has nocturia 4-5x Denies constipation  No renal stones     HOME ENDOCRINE MEDICATIONS: Alendronate 70 mg weekly  Vitamin D 2000 iu daily       HISTORY:  Past Medical History:  Past Medical History:  Diagnosis Date   Allergy    Arthritis    Blood transfusion without reported diagnosis    Cataract    Cholelithiasis    large stone detected by CT 01/2016   GERD (gastroesophageal reflux disease)    Hyperlipidemia    Hypertension     Nephrolithiasis    2mm kidney stone L by CT 01/2016   Osteoporosis    Retinal vein occlusion, 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   Past Surgical History:  Past Surgical History:  Procedure Laterality Date   ABDOMINAL HYSTERECTOMY     APPENDECTOMY     CHOLECYSTECTOMY     COLONOSCOPY     [facility]- records purges and destroyed- per pt Normal    JOINT REPLACEMENT N/A    [facility] 01/08/2021   SPINE SURGERY     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   Procedure: LEFT TOTAL HIP ARTHROPLASTY ANTERIOR APPROACH;  Surgeon: Tarry Kos, MD;  Location: [facility] OR;  Service: Orthopedics;  Laterality: Left;   TOTAL HIP ARTHROPLASTY Right 06/15/2020   Procedure: RIGHT TOTAL HIP ARTHROPLASTY ANTERIOR APPROACH;  Surgeon: Tarry Kos, MD;  Location: [facility] OR;  Service: Orthopedics;  Laterality: Right;   Social History:  reports that she quit smoking about 12 years ago. Her smoking use included cigarettes. She started smoking about 52 years ago. She has a 60 pack-year smoking history. She has never used smokeless tobacco. She reports that she does not drink alcohol and does not use drugs. Family History:  Family History  Problem Relation Age of Onset   Mental illness Mother    Stroke Father    Diabetes Father    Heart  disease Father 37       AMI   Hypertension Sister    Hypertension Brother    Arthritis Brother    ADD / ADHD Daughter    Heart disease Paternal Uncle    Colon cancer Neg Hx    Colon polyps Neg Hx    Esophageal cancer Neg Hx    Rectal cancer Neg Hx    Stomach cancer Neg Hx      HOME MEDICATIONS: Allergies as of 07/31/2023       Reactions   Other    Tramadol Nausea And Vomiting   Codeine Other (See Comments)   Headache   Oxycodone Palpitations        Medication List        Accurate as of July 31, 2023  8:08 AM. If you have any questions, ask your nurse or doctor.          STOP taking these medications    predniSONE 10 MG (21) Tbpk tablet Commonly known as: STERAPRED  UNI-PAK 21 TAB Stopped by: Johnney Ou Chaelyn Bunyan       TAKE these medications    acetaminophen 500 MG tablet Commonly known as: TYLENOL Take 1,000 mg by mouth every 6 (six) hours as needed for moderate pain.   alendronate 70 MG tablet Commonly known as: FOSAMAX Take 1 tablet (70 mg total) by mouth once a week.   amLODipine 10 MG tablet Commonly known as: NORVASC Take 1 tablet (10 mg total) by mouth daily.   aspirin EC 81 MG tablet Take 81 mg by mouth daily. Swallow whole.   fluticasone 50 MCG/ACT nasal spray Commonly known as: FLONASE Place 2 sprays into both nostrils daily.   gabapentin 100 MG capsule Commonly known as: NEURONTIN Take 1 capsule (100 mg total) by mouth at bedtime.   hydrochlorothiazide 25 MG tablet Commonly known as: HYDRODIURIL Take 1 tablet (25 mg total) by mouth daily.   naproxen sodium 220 MG tablet Commonly known as: ALEVE Take 440 mg by mouth 2 (two) times daily as needed (pain).   omeprazole 40 MG capsule Commonly known as: PRILOSEC Take 1 capsule (40 mg total) by mouth daily.   simvastatin 40 MG tablet Commonly known as: ZOCOR Take 1 tablet by mouth once daily with breakfast   VITAMIN D3 PO Take by mouth.          OBJECTIVE:   PHYSICAL EXAM: VS: BP 120/80 (BP Location: Left Arm, Patient Position: Sitting, Cuff Size: Small)   Pulse 68   Ht 5\' 6"  (1.676 m)   Wt 160 lb (72.6 kg)   SpO2 99%   BMI 25.82 kg/m    EXAM: General: Pt appears well and is in NAD  Neck: General: Supple without adenopathy. Thyroid: Thyroid size normal.  No goiter or nodules appreciated. No thyroid bruit.  Lungs: Clear with good BS bilat  Heart: Auscultation: RRR.  Extremities:  BL LE: No pretibial edema norm  Mental Status: Judgment, insight: Intact Orientation: Oriented to time, place, and person Memory: Intact for recent and remote events Mood and affect: No depression, anxiety, or agitation     DATA REVIEWED:   ****   DXA  12/24/2021   The BMD measured at Forearm Radius 33% is 0.617 g/cm2 with a T-score of -3.0. This patient is considered osteoporotic according to World Health Organization [facility]) criteria.   The quality of the exam is good. The lumbar spine was excluded due to degenerative changes. Left hip excluded due to surgical hardware.  Right hip excluded due to surgical hardware.   Site Region Measured Date Measured Age YA BMD Significant CHANGE T-score Right Forearm Radius 33% 12/24/2021 69.6 -3.1 0.617 g/cm2  ASSESSMENT / PLAN / RECOMMENDATIONS:   Primary Hyperparathyroidism:   - Calcium normalized with discontinuation of HCTZ and stopping OTC calcium, but she had to be restarted on HCTZ in 2022 due to lower extremity edema, serum calcium started to increase 04/2023. - 24-hr urine Ca/Cr ratio of 0.019 with calcium of 224 mg (08/2020), repeat 24-hour urinary calcium was low at 28 Mg (09/2022) while on HCTZ - Encouraged hydration  - Advised to continue avoiding OTC calcium  - Pt to consume 2-3 servings of dietary calcium     2. Clinical osteoporosis:  - We discussed the high FRAX rate  Of > 3% at the hips as well as a hx of fragility fracture in the past - DXA showed osteoporosis of the left distal radius 12/24/2021 with a T score of -3.0 - DXA scan at the breast center  -Unfortunately she has not been consistent with consuming 2-3 servings of calcium daily, patient encouraged to optimize calcium intake through her diet   Medication :   Continue Alendronate 70 mg weekly    3. Vitamin D Deficiency :   - Resolved    Medication  Vitamin D3 2000 iu daily     F/U in 6 months     Signed electronically by: Lyndle Herrlich, MD  [facility] Endocrinology  [facility] [address] [place], [place] [address] Phone: [phone] FAX: [phone]      CC: Gerre Scull, NP [address] [place] [address] Phone: [phone]  Fax:  [phone]   Return to Endocrinology clinic as below: Future Appointments  Date Time Provider Department Center  07/31/2023  8:10 AM Zaid Tomes, Konrad Dolores, MD [facility] None  10/23/2023  8:00 AM Gerre Scull, NP [facility]  06/17/2024  2:20 PM [facility]ANNUAL WELLNESS VISIT [facility]

## 2023-07-31 NOTE — Patient Instructions (Signed)
-   Stay Hydrated  - Avoid over the counter calcium tablets  - Consume 2-3 servings of calcium in your diet daily  - Continue Vitamin D 2000 iu daily

## 2023-08-01 LAB — PARATHYROID HORMONE, INTACT (NO CA): PTH: 61 pg/mL (ref 16–77)

## 2023-08-01 LAB — CALCIUM, IONIZED: Calcium, Ion: 5.4 mg/dL (ref 4.7–5.5)

## 2023-08-22 ENCOUNTER — Ambulatory Visit
Admission: RE | Admit: 2023-08-22 | Discharge: 2023-08-22 | Disposition: A | Discharge status: Home / Self Care | Payer: PPO | Source: Ambulatory Visit | Attending: Family Medicine | Admitting: Family Medicine

## 2023-08-22 ENCOUNTER — Ambulatory Visit (INDEPENDENT_AMBULATORY_CARE_PROVIDER_SITE_OTHER): Payer: PPO

## 2023-08-22 VITALS — BP 129/79 | HR 62 | Temp 98.0°F | Resp 18

## 2023-08-22 DIAGNOSIS — S20211A Contusion of right front wall of thorax, initial encounter: Secondary | ICD-10-CM | POA: Diagnosis not present

## 2023-08-22 DIAGNOSIS — S2241XA Multiple fractures of ribs, right side, initial encounter for closed fracture: Secondary | ICD-10-CM

## 2023-08-22 MED ORDER — TIZANIDINE HCL 4 MG PO CAPS: 4.0000 mg | ORAL_CAPSULE | Freq: Three times a day (TID) | ORAL | 0 refills | Status: DC | PRN

## 2023-08-22 NOTE — ED Triage Notes (Signed)
Pt c/o tripping over her dog x 3 days ago and fell she had to sleep in the recliner Saturday night. Pt c/o middle back pain when she coughs.

## 2023-08-23 ENCOUNTER — Ambulatory Visit: Payer: PPO | Admitting: Internal Medicine

## 2023-08-23 NOTE — ED Provider Notes (Signed)
[facility] CARE CENTER   [id number] 08/22/23 Arrival Time: 1349  ASSESSMENT & PLAN:  1. Rib contusion, right, initial encounter   2. Closed fracture of five ribs of right side, initial encounter    I have personally viewed and independently interpreted the imaging studies ordered this visit. R ribs with PA chest: ques R 5th rib fracture. No pneumothorax.  Without resp distress. Prefers OTC Tylenol/ibuprofen for pain control. Trial of: Meds ordered this encounter  Medications   tiZANidine (ZANAFLEX) 4 MG capsule    Sig: Take 1 capsule (4 mg total) by mouth 3 (three) times daily as needed for muscle spasms.    Dispense:  20 capsule    Refill:  0    Follow-up Information     McElwee, Lauren A, NP.   Specialty: Internal Medicine Why: As needed. Contact information: [address] [place] [address] [phone]         [facility] at [facility]).   Specialty: Urgent Care Why: If worsening or failing to improve as anticipated. Contact information: [address] [place] [address] [phone]                Reviewed expectations re: course of current medical issues. Questions answered. Outlined signs and symptoms indicating need for more acute intervention. Patient verbalized understanding. After Visit Summary given.   SUBJECTIVE:  History from: patient. Lori Cook is a 72 y.o. female who presents with complaint of tripping over her dog x 3 days ago and fell she had to sleep in the recliner Saturday night. Pt c/o middle back pain when she coughs. R side. Denies SOB. OTC analgesics controlling pain well.  Social History   Tobacco Use  Smoking Status Former   Current packs/day: 0.00   Average packs/day: 1.5 packs/day for 40.0 years (60.0 ttl pk-yrs)   Types: Cigarettes   Start date: 70   Quit date: 09/08/2010   Years since quitting: 12.9  Smokeless Tobacco Never   Social  History   Substance and Sexual Activity  Alcohol Use No     OBJECTIVE:  Vitals:   08/22/23 1429  BP: 129/79  Pulse: 62  Resp: 18  Temp: 98 F (36.7 C)  TempSrc: Oral  SpO2: 97%    General appearance: alert, oriented, no acute distress Eyes: PERRLA; EOMI; conjunctivae normal HENT: normocephalic; atraumatic Neck: supple with FROM Lungs: without labored respirations; speaks full sentences without difficulty; CTAB Heart: regular Chest Wall: with tenderness to palpation over mid R ribs; no gross abnormalities; no overlying bruising Abdomen: soft, non-tender; no guarding or rebound tenderness Extremities: without edema; without calf swelling or tenderness; symmetrical without gross deformities Skin: warm and dry; without rash or lesions Neuro: normal gait Psychological: alert and cooperative; normal mood and affect  Imaging: DG Ribs Unilateral W/Chest Right  Result Date: 08/22/2023 CLINICAL DATA:  Back pain after fall 3 days ago. EXAM: RIGHT RIBS AND CHEST - 3+ VIEW COMPARISON:  June 12, 2021. FINDINGS: Minimally displaced fracture involving anterior aspect of right fifth rib. There is no evidence of pneumothorax or pleural effusion. Both lungs are clear. Heart size and mediastinal contours are within normal limits. IMPRESSION: Minimally displaced right fifth rib fracture Electronically Signed   By: Lupita Raider M.D.   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   Social  History   Socioeconomic History   Marital status: Single    Spouse name: Not on file   Number of children: Not on file   Years of education: Not on file   Highest education level: Not on file  Occupational History   Not on file  Tobacco Use   Smoking status: Former    Current packs/day: 0.00    Average packs/day: 1.5 packs/day for 40.0 years (60.0 ttl pk-yrs)    Types: Cigarettes    Start date: 81    Quit date: 09/08/2010    Years since quitting: 12.9   Smokeless tobacco: Never  Vaping Use   Vaping status: Never Used  Substance and Sexual Activity   Alcohol use: No   Drug use: Never   Sexual activity: Not Currently  Other Topics Concern   Not on file  Social History Narrative   Not on file   Social Determinants of Health   Financial Resource Strain: Low Risk  (06/08/2023)   Overall Financial Resource Strain (CARDIA)    Difficulty of Paying Living Expenses: Not hard at all  Food Insecurity: No Food Insecurity (06/08/2023)   Hunger Vital Sign    Worried About Running Out of Food in the Last Year: Never true    Ran Out of Food in the Last Year: Never true  Transportation Needs: No Transportation Needs (06/08/2023)   PRAPARE - Administrator, Civil Service (Medical): No    Lack of Transportation (Non-Medical): No  Physical Activity: Sufficiently Active (06/08/2023)   Exercise Vital Sign    Days of Exercise per Week: 6 days    Minutes of Exercise per Session: 150+ min  Stress: No Stress Concern Present (06/08/2023)   [employer] of Occupational Health - Occupational Stress Questionnaire    Feeling of Stress : Not at all  Social Connections: Unknown (06/08/2023)   Social Connection and Isolation Panel [NHANES]    Frequency of Communication with Friends and Family: Three times a week    Frequency of Social Gatherings with Friends and Family: Twice a week    Attends Religious Services: Not on Marketing executive or Organizations: Yes    Attends  Banker Meetings: More than 4 times per year    Marital Status: Divorced  Intimate Partner Violence: Not At Risk (06/12/2023)   Humiliation, Afraid, Rape, and Kick questionnaire    Fear of Current or Ex-Partner: No    Emotionally Abused: No    Physically Abused: No    Sexually Abused: No   Family History  Problem Relation Age of Onset   Mental illness Mother    Stroke Father    Diabetes Father    Heart disease Father 37       AMI   Hypertension Sister    Hypertension Brother    Arthritis Brother    ADD / ADHD Daughter    Heart disease Paternal Uncle    Colon cancer Neg Hx  Colon polyps Neg Hx    Esophageal cancer Neg Hx    Rectal cancer Neg Hx    Stomach cancer Neg Hx    Past Surgical History:  Procedure Laterality Date   ABDOMINAL HYSTERECTOMY     APPENDECTOMY     CHOLECYSTECTOMY     COLONOSCOPY     [facility]- records purges and destroyed- per pt Normal    JOINT REPLACEMENT N/A    Phreesia 01/08/2021   SPINE SURGERY     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   Procedure: LEFT TOTAL HIP ARTHROPLASTY ANTERIOR APPROACH;  Surgeon: Tarry Kos, MD;  Location: [facility] OR;  Service: Orthopedics;  Laterality: Left;   TOTAL HIP ARTHROPLASTY Right 06/15/2020   Procedure: RIGHT TOTAL HIP ARTHROPLASTY ANTERIOR APPROACH;  Surgeon: Tarry Kos, MD;  Location: [facility] OR;  Service: Orthopedics;  Laterality: Right;      Mardella Layman, MD 08/23/23 [phone]

## 2023-09-12 ENCOUNTER — Ambulatory Visit (INDEPENDENT_AMBULATORY_CARE_PROVIDER_SITE_OTHER): Payer: PPO | Admitting: Nurse Practitioner

## 2023-09-12 ENCOUNTER — Encounter: Payer: Self-pay | Admitting: Nurse Practitioner

## 2023-09-12 VITALS — BP 124/68 | HR 75 | Temp 97.9°F | Ht 66.0 in | Wt 160.4 lb

## 2023-09-12 DIAGNOSIS — R918 Other nonspecific abnormal finding of lung field: Secondary | ICD-10-CM | POA: Diagnosis not present

## 2023-09-12 DIAGNOSIS — E210 Primary hyperparathyroidism: Secondary | ICD-10-CM | POA: Diagnosis not present

## 2023-09-12 DIAGNOSIS — J432 Centrilobular emphysema: Secondary | ICD-10-CM | POA: Insufficient documentation

## 2023-09-12 DIAGNOSIS — I10 Essential (primary) hypertension: Secondary | ICD-10-CM

## 2023-09-12 DIAGNOSIS — E785 Hyperlipidemia, unspecified: Secondary | ICD-10-CM

## 2023-09-12 DIAGNOSIS — R229 Localized swelling, mass and lump, unspecified: Secondary | ICD-10-CM

## 2023-09-12 DIAGNOSIS — R351 Nocturia: Secondary | ICD-10-CM | POA: Insufficient documentation

## 2023-09-12 DIAGNOSIS — I700 Atherosclerosis of aorta: Secondary | ICD-10-CM | POA: Insufficient documentation

## 2023-09-12 NOTE — Assessment & Plan Note (Signed)
 Her CT scan shows stable nodules, unchanged from 2018. We plan for a repeat CT scan in 1 year for surveillance.

## 2023-09-12 NOTE — Assessment & Plan Note (Signed)
Noted on CT scan 05/05/23. Continue simvastatin 40mg  daily.

## 2023-09-12 NOTE — Patient Instructions (Signed)
 It was great to see you!  I have placed a referral to dermatology.   Let's follow-up in 6 months (fasting), sooner if you have concerns.  If a referral was placed today, you will be contacted for an appointment. Please note that routine referrals can sometimes take up to 3-4 weeks to process. Please call our office if you haven't heard anything after this time frame.  Take care,  Tinnie Harada, NP

## 2023-09-12 NOTE — Assessment & Plan Note (Signed)
Chronic, stable.  Continue amlodipine 10 mg daily and HCTZ 25 mg daily.  Reviewed recent BMP and potassium is back in normal range. Follow-up in 6 months.

## 2023-09-12 NOTE — Assessment & Plan Note (Signed)
 Her LDL is slightly elevated at 108 while on Simvastatin. We will continue Simvastatin 40mg  daily and recheck the lipid panel in 6 months.

## 2023-09-12 NOTE — Assessment & Plan Note (Signed)
Currently following with endocrine. Last calcium and PTH were normal .

## 2023-09-12 NOTE — Assessment & Plan Note (Signed)
Noted on CT scan 05/05/23. No current symptoms. Will continue to monitor.

## 2023-09-12 NOTE — Progress Notes (Signed)
 Established Patient Office Visit  Subjective   Patient ID: Lori Cook, female    DOB: [birth year]  Age: 72 y.o. MRN: [id number]  Chief Complaint  Patient presents with   Hypertension    Follow up, concerns with a bump on her nose and CT Scan of Lungs   HPI:  Discussed the use of AI scribe software for clinical note transcription with the patient, who gave verbal consent to proceed.  History of Present Illness   The patient, with a history of hypertension, hyperlipidemia, and GERD, presents for follow-up after a recent fall. She reports no significant injuries or ongoing pain from the fall. She has been managing well with her current medications, including simvastatin  for hyperlipidemia and Prilosec for GERD. She notes that her coughing has improved significantly with regular use of Prilosec.  The patient also reports a bump on her nose that has been present for approximately six to nine months. She notes that it bled once when she accidentally scratched it but has not changed significantly in size or caused any pain or itching.  Additionally, the patient expresses concern about frequent urination at night, often waking up three to four times to urinate. She reports no burning sensation during urination and does not experience increased urination during the day. She also mentions a history of a dropped bladder.  Lastly, the patient discusses a recent CT scan of her lungs, which showed small nodules that have not changed in size since a prior scan in 2018. The scan also revealed aortic atherosclerosis and a small amount of emphysema. The patient reports no shortness of breath or other respiratory symptoms.        ROS See pertinent positives and negatives per HPI.    Objective:     BP 124/68 (BP Location: Left Arm, Patient Position: Sitting, Cuff Size: Normal)   Pulse 75   Temp 97.9 F (36.6 C) (Oral)   Ht 5' 6 (1.676 m)   Wt 160 lb 6.4 oz (72.8 kg)   SpO2 98%   BMI  25.89 kg/m    Physical Exam Vitals and nursing note reviewed.  Constitutional:      General: She is not in acute distress.    Appearance: Normal appearance.  HENT:     Head: Normocephalic.  Eyes:     Conjunctiva/sclera: Conjunctivae normal.  Cardiovascular:     Rate and Rhythm: Normal rate and regular rhythm.     Pulses: Normal pulses.     Heart sounds: Normal heart sounds.  Pulmonary:     Effort: Pulmonary effort is normal.     Breath sounds: Normal breath sounds.  Musculoskeletal:     Cervical back: Normal range of motion.  Skin:    General: Skin is warm.     Comments: Round, red nodule on left nostril  Neurological:     General: No focal deficit present.     Mental Status: She is alert and oriented to person, place, and time.  Psychiatric:        Mood and Affect: Mood normal.        Behavior: Behavior normal.        Thought Content: Thought content normal.        Judgment: Judgment normal.    The 10-year ASCVD risk score (Arnett DK, et al., 2019) is: 13.4%    Assessment & Plan:   Problem List Items Addressed This Visit       Cardiovascular and Mediastinum   Essential hypertension, benign  Chronic, stable.  Continue amlodipine  10 mg daily and HCTZ 25 mg daily.  Reviewed recent BMP and potassium is back in normal range. Follow-up in 6 months.       Aortic atherosclerosis (HCC)   Noted on CT scan 05/05/23. Continue simvastatin  40mg  daily.         Respiratory   Pulmonary nodules   Her CT scan shows stable nodules, unchanged from 2018. We plan for a repeat CT scan in 1 year for surveillance.      Centrilobular emphysema (HCC)   Noted on CT scan 05/05/23. No current symptoms. Will continue to monitor.         Endocrine   Primary hyperparathyroidism (HCC)   Currently following with endocrine. Last calcium and PTH were normal .        Other   Hyperlipidemia with target LDL less than 130   Her LDL is slightly elevated at 108 while on Simvastatin . We  will continue Simvastatin  40mg  daily and recheck the lipid panel in 6 months.      Nocturia   She reports frequent urination at night, possibly related to fluid intake and overactive bladder. We advise limiting fluid intake after 7 PM, kegel exericses, and consider medication if symptoms persist.       Other Visit Diagnoses       Skin nodule    -  Primary   She has had a persistent lesion on her nose for 6-9 months without associated pain or itching. Will refer her to [facility] for evaluation.   Relevant Orders   Ambulatory referral to Dermatology       Return in about 6 months (around 03/11/2024) for CPE.    Tinnie DELENA Harada, NP

## 2023-09-12 NOTE — Assessment & Plan Note (Signed)
 She reports frequent urination at night, possibly related to fluid intake and overactive bladder. We advise limiting fluid intake after 7 PM, kegel exericses, and consider medication if symptoms persist.

## 2023-09-12 NOTE — Assessment & Plan Note (Deleted)
She has history of pulmonary nodules on prior CT scan.  Will get an updated low-dose CT screening for lung cancer with her smoking cessation history and to follow-up on the nodules.

## 2023-10-16 DIAGNOSIS — L820 Inflamed seborrheic keratosis: Secondary | ICD-10-CM | POA: Diagnosis not present

## 2023-10-16 DIAGNOSIS — C44311 Basal cell carcinoma of skin of nose: Secondary | ICD-10-CM | POA: Diagnosis not present

## 2023-10-16 DIAGNOSIS — D485 Neoplasm of uncertain behavior of skin: Secondary | ICD-10-CM | POA: Diagnosis not present

## 2023-10-16 DIAGNOSIS — Z85828 Personal history of other malignant neoplasm of skin: Secondary | ICD-10-CM | POA: Diagnosis not present

## 2023-10-23 ENCOUNTER — Ambulatory Visit: Payer: PPO | Admitting: Nurse Practitioner

## 2023-11-17 ENCOUNTER — Other Ambulatory Visit: Payer: Self-pay | Admitting: Nurse Practitioner

## 2023-11-17 DIAGNOSIS — M79604 Pain in right leg: Secondary | ICD-10-CM

## 2023-11-17 NOTE — Telephone Encounter (Signed)
 Requesting: Gabapentin 100 MG Oral Capsule  Last Visit: 09/12/2023 Next Visit: 03/11/2024 Last Refill: 04/27/2023  Please Advise

## 2023-11-21 DIAGNOSIS — C44311 Basal cell carcinoma of skin of nose: Secondary | ICD-10-CM | POA: Diagnosis not present

## 2023-11-21 DIAGNOSIS — Z85828 Personal history of other malignant neoplasm of skin: Secondary | ICD-10-CM | POA: Diagnosis not present

## 2023-12-12 HISTORY — PX: CATARACT EXTRACTION: SUR2

## 2023-12-29 DIAGNOSIS — H35033 Hypertensive retinopathy, bilateral: Secondary | ICD-10-CM | POA: Diagnosis not present

## 2023-12-29 DIAGNOSIS — H43393 Other vitreous opacities, bilateral: Secondary | ICD-10-CM | POA: Diagnosis not present

## 2024-01-08 DIAGNOSIS — H34831 Tributary (branch) retinal vein occlusion, right eye, with macular edema: Secondary | ICD-10-CM | POA: Diagnosis not present

## 2024-01-08 DIAGNOSIS — H2513 Age-related nuclear cataract, bilateral: Secondary | ICD-10-CM | POA: Diagnosis not present

## 2024-01-12 ENCOUNTER — Other Ambulatory Visit: Payer: Self-pay | Admitting: Nurse Practitioner

## 2024-01-12 DIAGNOSIS — I10 Essential (primary) hypertension: Secondary | ICD-10-CM

## 2024-01-12 DIAGNOSIS — E785 Hyperlipidemia, unspecified: Secondary | ICD-10-CM

## 2024-01-12 NOTE — Telephone Encounter (Signed)
 Requesting: Simvastatin  40 MG Oral Tablet ,  hydroCHLOROthiazide  25 MG Oral Tablet , amLODIPine  Besylate 10 MG Oral Tablet  Last Visit: 09/12/2023 Next Visit: 03/11/2024 Last Refill: 07/10/2023,   Please Advise

## 2024-01-14 ENCOUNTER — Other Ambulatory Visit: Payer: Self-pay | Admitting: Nurse Practitioner

## 2024-01-15 NOTE — Telephone Encounter (Signed)
 Requesting: Omeprazole  40 MG Oral Capsule Delayed Release  Last Visit: 09/12/2023 Next Visit: 03/11/2024 Last Refill: 07/21/2023  Please Advise

## 2024-01-19 DIAGNOSIS — F419 Anxiety disorder, unspecified: Secondary | ICD-10-CM | POA: Diagnosis not present

## 2024-01-19 DIAGNOSIS — I10 Essential (primary) hypertension: Secondary | ICD-10-CM | POA: Diagnosis not present

## 2024-01-19 DIAGNOSIS — H2512 Age-related nuclear cataract, left eye: Secondary | ICD-10-CM | POA: Diagnosis not present

## 2024-01-21 DIAGNOSIS — H2513 Age-related nuclear cataract, bilateral: Secondary | ICD-10-CM | POA: Diagnosis not present

## 2024-01-23 DIAGNOSIS — H59092 Other disorders of the left eye following cataract surgery: Secondary | ICD-10-CM | POA: Diagnosis not present

## 2024-01-26 DIAGNOSIS — L920 Granuloma annulare: Secondary | ICD-10-CM | POA: Diagnosis not present

## 2024-01-26 DIAGNOSIS — L814 Other melanin hyperpigmentation: Secondary | ICD-10-CM | POA: Diagnosis not present

## 2024-01-26 DIAGNOSIS — D220 Melanocytic nevi of lip: Secondary | ICD-10-CM | POA: Diagnosis not present

## 2024-01-26 DIAGNOSIS — L821 Other seborrheic keratosis: Secondary | ICD-10-CM | POA: Diagnosis not present

## 2024-01-26 DIAGNOSIS — Z85828 Personal history of other malignant neoplasm of skin: Secondary | ICD-10-CM | POA: Diagnosis not present

## 2024-01-26 DIAGNOSIS — D2271 Melanocytic nevi of right lower limb, including hip: Secondary | ICD-10-CM | POA: Diagnosis not present

## 2024-01-26 DIAGNOSIS — D1801 Hemangioma of skin and subcutaneous tissue: Secondary | ICD-10-CM | POA: Diagnosis not present

## 2024-01-26 DIAGNOSIS — L570 Actinic keratosis: Secondary | ICD-10-CM | POA: Diagnosis not present

## 2024-01-26 DIAGNOSIS — D225 Melanocytic nevi of trunk: Secondary | ICD-10-CM | POA: Diagnosis not present

## 2024-01-29 ENCOUNTER — Ambulatory Visit: Payer: PPO | Admitting: Internal Medicine

## 2024-02-01 DIAGNOSIS — H2513 Age-related nuclear cataract, bilateral: Secondary | ICD-10-CM | POA: Diagnosis not present

## 2024-02-02 DIAGNOSIS — H2511 Age-related nuclear cataract, right eye: Secondary | ICD-10-CM | POA: Diagnosis not present

## 2024-02-02 DIAGNOSIS — I10 Essential (primary) hypertension: Secondary | ICD-10-CM | POA: Diagnosis not present

## 2024-02-13 ENCOUNTER — Other Ambulatory Visit: Payer: Self-pay | Admitting: Nurse Practitioner

## 2024-02-13 DIAGNOSIS — M79604 Pain in right leg: Secondary | ICD-10-CM

## 2024-02-13 NOTE — Telephone Encounter (Signed)
 Requesting: Gabapentin  100 MG Oral Capsule  Last Visit: 09/12/2023 Next Visit: 03/11/2024 Last Refill: 11/17/2023  Please Advise

## 2024-02-16 DIAGNOSIS — H348311 Tributary (branch) retinal vein occlusion, right eye, with retinal neovascularization: Secondary | ICD-10-CM | POA: Diagnosis not present

## 2024-02-26 ENCOUNTER — Telehealth: Payer: Self-pay | Admitting: Internal Medicine

## 2024-02-26 ENCOUNTER — Encounter: Payer: Self-pay | Admitting: Internal Medicine

## 2024-02-26 ENCOUNTER — Ambulatory Visit: Admitting: Internal Medicine

## 2024-02-26 VITALS — BP 120/80 | HR 68 | Ht 66.0 in | Wt 165.0 lb

## 2024-02-26 DIAGNOSIS — E559 Vitamin D deficiency, unspecified: Secondary | ICD-10-CM

## 2024-02-26 DIAGNOSIS — M810 Age-related osteoporosis without current pathological fracture: Secondary | ICD-10-CM

## 2024-02-26 DIAGNOSIS — R635 Abnormal weight gain: Secondary | ICD-10-CM | POA: Diagnosis not present

## 2024-02-26 DIAGNOSIS — E210 Primary hyperparathyroidism: Secondary | ICD-10-CM

## 2024-02-26 NOTE — Telephone Encounter (Signed)
 Please schedule the patient for a bone density at the breast center, she prefers latest appointments on Mondays or Fridays   If unable to do at the breast center because they do not do them anymore, then please schedule at [facility]   Dx osteoporosis, include distal radius    Thanks

## 2024-02-26 NOTE — Progress Notes (Unsigned)
 Name: Lori Cook  MRN/ DOB: [id number], [birth year]    Age/ Sex: 73 y.o., female     PCP: Odette Benjamin, NP   Reason for Endocrinology Evaluation: Hypercalcemia      Initial Endocrinology Clinic Visit: 01/08/2020    PATIENT IDENTIFIER: Lori Cook is a 73 y.o., female with a past medical history of Osteopenia and dyslipidemia. She has followed with [facility] since 01/08/2020 for consultative assistance with management of her hypercalcemia.   HISTORICAL SUMMARY: The patient was first diagnosed with intermittent hypercalcemia since 2016  with serum calcium os 10.5 mg/dL ( Corrected calcium 4.09) and again in 2019 , serum calcium 10.4 ( corrected 9.84 mg/dL ) and in 04/1190 at 47.8 ( Corrected 10.32).  Her PTH has been fluctuating as high as 75 pg/mL .  She has a hx of renal stones  She has low bone density on DXA ( 10/24/2018)  As well as slip and fall wrist fracture  She was started on Alendronate  in 12/2019 due to clinical osteoporosis   DXA 12/24/2021 showed osteoporosis at the distal radius with a T score of -3.0  HCTZ was stopped in 10/2019 but was back on it by 2022 due to LE edema   24-hr urine Ca/Cr ratio of 0.019 with calcium of 224 mg, repeat low at 28 mcg 2023 while on hydrochlorothiazide    SUBJECTIVE:     Today (02/26/2024):  Ms. Waldo is here for hypercalcemia and osteoporosis.   Denies polydipsia, no polyuria  She has GERD , properly controlled on PPI as well as famotidine , chronic cough   Denies constipation  No renal stones  Consumes 2 servings on  Has noted weight gain over the past year    HOME ENDOCRINE MEDICATIONS: Alendronate  70 mg weekly  Vitamin D  2000 iu daily       HISTORY:  Past Medical History:  Past Medical History:  Diagnosis Date   Allergy    Arthritis    Blood transfusion without reported diagnosis    Cataract    Cholelithiasis    large stone detected by CT 01/2016   GERD (gastroesophageal reflux  disease)    Hyperlipidemia    Hypertension    Nephrolithiasis    2mm kidney stone L by CT 01/2016   Osteoporosis    Retinal vein occlusion, 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   Past Surgical History:  Past Surgical History:  Procedure Laterality Date   ABDOMINAL HYSTERECTOMY     APPENDECTOMY     CHOLECYSTECTOMY     COLONOSCOPY     [facility]- records purges and destroyed- per pt Normal    JOINT REPLACEMENT N/A    [employer]reesia 01/08/2021   SPINE SURGERY     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   Procedure: LEFT TOTAL HIP ARTHROPLASTY ANTERIOR APPROACH;  Surgeon: Wes Hamman, MD;  Location: [facility] OR;  Service: Orthopedics;  Laterality: Left;   TOTAL HIP ARTHROPLASTY Right 06/15/2020   Procedure: RIGHT TOTAL HIP ARTHROPLASTY ANTERIOR APPROACH;  Surgeon: Wes Hamman, MD;  Location: [facility] OR;  Service: Orthopedics;  Laterality: Right;   Social History:  reports that she quit smoking about 13 years ago. Her smoking use included cigarettes. She started smoking about 53 years ago. She has a 60 pack-year smoking history. She has never used smokeless tobacco. She reports that she does not drink alcohol  and does not use drugs. Family History:  Family History  Problem Relation Age of Onset   Mental illness Mother  Stroke Father    Diabetes Father    Heart disease Father 33       AMI   Hypertension Sister    Hypertension Brother    Arthritis Brother    ADD / ADHD Daughter    Heart disease Paternal Uncle    Colon cancer Neg Hx    Colon polyps Neg Hx    Esophageal cancer Neg Hx    Rectal cancer Neg Hx    Stomach cancer Neg Hx      HOME MEDICATIONS: Allergies as of 02/26/2024       Reactions   Other    Tramadol Nausea And Vomiting   Codeine Other (See Comments)   Headache   Oxycodone  Palpitations        Medication List        Accurate as of February 26, 2024  8:25 AM. If you have any questions, ask your nurse or doctor.          acetaminophen  500 MG tablet Commonly known as:  TYLENOL  Take 1,000 mg by mouth every 6 (six) hours as needed for moderate pain.   alendronate  70 MG tablet Commonly known as: FOSAMAX  Take 1 tablet (70 mg total) by mouth once a week.   amLODipine  10 MG tablet Commonly known as: NORVASC  Take 1 tablet by mouth once daily   aspirin  EC 81 MG tablet Take 81 mg by mouth daily. Swallow whole.   fluticasone  50 MCG/ACT nasal spray Commonly known as: FLONASE  Place 2 sprays into both nostrils daily.   gabapentin  100 MG capsule Commonly known as: NEURONTIN  Take 1 capsule by mouth at bedtime   hydrochlorothiazide  25 MG tablet Commonly known as: HYDRODIURIL  Take 1 tablet by mouth once daily   naproxen sodium 220 MG tablet Commonly known as: ALEVE Take 440 mg by mouth 2 (two) times daily as needed (pain).   ofloxacin 0.3 % ophthalmic solution Commonly known as: OCUFLOX Place 1 drop into the left eye 4 (four) times daily.   omeprazole  40 MG capsule Commonly known as: PRILOSEC Take 1 capsule by mouth once daily   prednisoLONE acetate 1 % ophthalmic suspension Commonly known as: PRED FORTE INSTILL 1 DROP 4 TIMES DAILY INTO LEFT EYE FOR 7 DAYS THEN TWICE DAILY FOR 7 DAYS THEN ONCE DAILY FOR 2 WEEKS, THEN STOP.   simvastatin  40 MG tablet Commonly known as: ZOCOR  Take 1 tablet by mouth once daily with breakfast   VITAMIN D3 PO Take by mouth.          OBJECTIVE:   PHYSICAL EXAM: VS: BP 120/80 (BP Location: Left Arm, Patient Position: Sitting, Cuff Size: Normal)   Pulse 68   Ht 5' 6 (1.676 m)   Wt 165 lb (74.8 kg)   SpO2 98%   BMI 26.63 kg/m    EXAM: General: Pt appears well and is in NAD  Neck: General: Supple without adenopathy. Thyroid : Thyroid  size normal.  No goiter or nodules appreciated. No thyroid  bruit.  Lungs: Clear with good BS bilat  Heart: Auscultation: RRR.  Extremities:  BL LE: No pretibial edema norm  Mental Status: Judgment, insight: Intact Orientation: Oriented to time, place, and  person Memory: Intact for recent and remote events Mood and affect: No depression, anxiety, or agitation     DATA REVIEWED:   Latest Reference Range & Units 07/31/23 08:41  Sodium 135 - 145 mEq/L 140  Potassium 3.5 - 5.1 mEq/L 3.6  Chloride 96 - 112 mEq/L 99  CO2 19 - 32  mEq/L 29  Glucose 70 - 99 mg/dL 409 (H)  BUN 6 - 23 mg/dL 18  Creatinine 8.11 - 9.14 mg/dL 7.82  Calcium 8.4 - 95.6 mg/dL 21.3 (H)  Albumin 3.5 - 5.2 g/dL 4.6  GFR >08.65 mL/min 77.70    Latest Reference Range & Units 07/31/23 08:41  VITD 30.00 - 100.00 ng/mL 33.61     DXA 12/24/2021 @ [facility] Center    The BMD measured at Forearm Radius 33% is 0.617 g/cm2 with a T-score of -3.0. This patient is considered osteoporotic according to World Health Organization [facility]) criteria.   The quality of the exam is good. The lumbar spine was excluded due to degenerative changes. Left hip excluded due to surgical hardware. Right hip excluded due to surgical hardware.   Site Region Measured Date Measured Age YA BMD Significant CHANGE T-score Right Forearm Radius 33% 12/24/2021 69.6 -3.0 0.617 g/cm2  ASSESSMENT / PLAN / RECOMMENDATIONS:   Primary Hyperparathyroidism:   - Calcium normalized with discontinuation of HCTZ and stopping OTC calcium, but she had to be restarted on HCTZ in 2022 due to lower extremity edema, serum calcium started to increase 04/2023. - 24-hr urine Ca/Cr ratio of 0.019 with calcium of 224 mg (08/2020), repeat 24-hour urinary calcium was low at 28 Mg (09/2022) while on hydrochlorothiazide  -I did explain to the patient that it is okay to continue with HCTZ at this time, as it does have renal protection from hypercalciuria despite slight elevation in serum calcium -Serum calcium 10.7 Mg/DL (corrected normal at 78.46) , GFR and vitamin D  remain normal   Recommendations - Encouraged hydration  - Advised to continue avoiding OTC calcium  - Pt to consume 2-3 servings of dietary calcium     2.  Clinical osteoporosis:  - We discussed the high FRAX rate  Of > 3% at the hips as well as a hx of fragility fracture in the past - DXA showed osteoporosis of the left distal radius 12/24/2021 with a T score of -3.0 - DXA scan at the breast center  -Unfortunately she has not been consistent with consuming 2-3 servings of calcium daily, patient encouraged to optimize calcium intake through her diet   Medication :   Continue Alendronate  70 mg weekly    3. Vitamin D  Deficiency :   - Resolved    Medication  Vitamin D3 2000 iu daily     F/U in 6 months     Signed electronically by: Natale Bail, MD  [facility]  [facility] [address] [place] [place], [place] [address] Phone: [phone] FAX: [phone]      CC: Odette Benjamin, NP [address] [place] [address] Phone: [phone]  Fax: [phone]   Return to Endocrinology clinic as below: Future Appointments  Date Time Provider Department Center  02/26/2024  8:30 AM Rowyn Mustapha, Julian Obey, MD [facility] None  03/11/2024  8:20 AM Odette Benjamin, NP [facility]  06/17/2024  2:20 PM [facility]ANNUAL WELLNESS VISIT [facility]

## 2024-02-26 NOTE — Patient Instructions (Signed)
-   Stay Hydrated  - Avoid over the counter calcium tablets  - Consume 2-3 servings of calcium in your diet daily  - Continue Vitamin D 2000 iu daily

## 2024-02-27 ENCOUNTER — Ambulatory Visit: Payer: Self-pay | Admitting: Internal Medicine

## 2024-02-27 LAB — BASIC METABOLIC PANEL WITH GFR
BUN: 15 mg/dL (ref 7–25)
CO2: 32 mmol/L (ref 20–32)
Calcium: 10.8 mg/dL — ABNORMAL HIGH (ref 8.6–10.4)
Chloride: 100 mmol/L (ref 98–110)
Creat: 0.65 mg/dL (ref 0.60–1.00)
Glucose, Bld: 96 mg/dL (ref 65–99)
Potassium: 3.9 mmol/L (ref 3.5–5.3)
Sodium: 140 mmol/L (ref 135–146)
eGFR: 94 mL/min/{1.73_m2} (ref >=60)

## 2024-02-27 LAB — TSH: TSH: 1.58 m[IU]/L (ref 0.40–4.50)

## 2024-02-27 LAB — T4, FREE: Free T4: 1.3 ng/dL (ref 0.8–1.8)

## 2024-02-27 LAB — PARATHYROID HORMONE, INTACT (NO CA): PTH: 82 pg/mL — ABNORMAL HIGH (ref 16–77)

## 2024-02-27 LAB — ALBUMIN: Albumin: 4.7 g/dL (ref 3.6–5.1)

## 2024-02-27 LAB — VITAMIN D 25 HYDROXY (VIT D DEFICIENCY, FRACTURES): Vit D, 25-Hydroxy: 38 ng/mL (ref 30–100)

## 2024-03-01 DIAGNOSIS — E2839 Other primary ovarian failure: Secondary | ICD-10-CM | POA: Diagnosis not present

## 2024-03-01 DIAGNOSIS — M8588 Other specified disorders of bone density and structure, other site: Secondary | ICD-10-CM | POA: Diagnosis not present

## 2024-03-01 DIAGNOSIS — N958 Other specified menopausal and perimenopausal disorders: Secondary | ICD-10-CM | POA: Diagnosis not present

## 2024-03-01 LAB — HM DEXA SCAN

## 2024-03-04 ENCOUNTER — Encounter: Payer: Self-pay | Admitting: Internal Medicine

## 2024-03-11 ENCOUNTER — Ambulatory Visit (INDEPENDENT_AMBULATORY_CARE_PROVIDER_SITE_OTHER): Payer: PPO | Admitting: Nurse Practitioner

## 2024-03-11 ENCOUNTER — Encounter: Payer: Self-pay | Admitting: Nurse Practitioner

## 2024-03-11 VITALS — BP 122/68 | HR 65 | Temp 98.1°F | Ht 67.0 in | Wt 163.8 lb

## 2024-03-11 DIAGNOSIS — J432 Centrilobular emphysema: Secondary | ICD-10-CM | POA: Diagnosis not present

## 2024-03-11 DIAGNOSIS — R252 Cramp and spasm: Secondary | ICD-10-CM

## 2024-03-11 DIAGNOSIS — Z0000 Encounter for general adult medical examination without abnormal findings: Secondary | ICD-10-CM | POA: Insufficient documentation

## 2024-03-11 DIAGNOSIS — Z23 Encounter for immunization: Secondary | ICD-10-CM

## 2024-03-11 DIAGNOSIS — M810 Age-related osteoporosis without current pathological fracture: Secondary | ICD-10-CM

## 2024-03-11 DIAGNOSIS — I700 Atherosclerosis of aorta: Secondary | ICD-10-CM | POA: Diagnosis not present

## 2024-03-11 DIAGNOSIS — E210 Primary hyperparathyroidism: Secondary | ICD-10-CM

## 2024-03-11 DIAGNOSIS — E559 Vitamin D deficiency, unspecified: Secondary | ICD-10-CM

## 2024-03-11 DIAGNOSIS — R7303 Prediabetes: Secondary | ICD-10-CM | POA: Diagnosis not present

## 2024-03-11 DIAGNOSIS — S51801A Unspecified open wound of right forearm, initial encounter: Secondary | ICD-10-CM | POA: Diagnosis not present

## 2024-03-11 DIAGNOSIS — I10 Essential (primary) hypertension: Secondary | ICD-10-CM

## 2024-03-11 DIAGNOSIS — H34832 Tributary (branch) retinal vein occlusion, left eye, with macular edema: Secondary | ICD-10-CM | POA: Diagnosis not present

## 2024-03-11 DIAGNOSIS — E785 Hyperlipidemia, unspecified: Secondary | ICD-10-CM | POA: Diagnosis not present

## 2024-03-11 LAB — CBC WITH DIFFERENTIAL/PLATELET
Basophils Absolute: 0 10*3/uL (ref 0.0–0.1)
Basophils Relative: 0.5 % (ref 0.0–3.0)
Eosinophils Absolute: 0.2 10*3/uL (ref 0.0–0.7)
Eosinophils Relative: 2.7 % (ref 0.0–5.0)
HCT: 40.8 % (ref 36.0–46.0)
Hemoglobin: 13.8 g/dL (ref 12.0–15.0)
Lymphocytes Relative: 28.9 % (ref 12.0–46.0)
Lymphs Abs: 2.5 10*3/uL (ref 0.7–4.0)
MCHC: 33.8 g/dL (ref 30.0–36.0)
MCV: 86 fl (ref 78.0–100.0)
Monocytes Absolute: 1 10*3/uL (ref 0.1–1.0)
Monocytes Relative: 11.9 % (ref 3.0–12.0)
Neutro Abs: 4.9 10*3/uL (ref 1.4–7.7)
Neutrophils Relative %: 56 % (ref 43.0–77.0)
Platelets: 292 10*3/uL (ref 150.0–400.0)
RBC: 4.75 Mil/uL (ref 3.87–5.11)
RDW: 14.4 % (ref 11.5–15.5)
WBC: 8.7 10*3/uL (ref 4.0–10.5)

## 2024-03-11 LAB — COMPREHENSIVE METABOLIC PANEL WITH GFR
ALT: 17 U/L (ref 0–35)
AST: 17 U/L (ref 0–37)
Albumin: 4.4 g/dL (ref 3.5–5.2)
Alkaline Phosphatase: 68 U/L (ref 39–117)
BUN: 14 mg/dL (ref 6–23)
CO2: 32 meq/L (ref 19–32)
Calcium: 10 mg/dL (ref 8.4–10.5)
Chloride: 101 meq/L (ref 96–112)
Creatinine, Ser: 0.76 mg/dL (ref 0.40–1.20)
GFR: 78.59 mL/min (ref >60.00)
Glucose, Bld: 99 mg/dL (ref 70–99)
Potassium: 3.1 meq/L — ABNORMAL LOW (ref 3.5–5.1)
Sodium: 141 meq/L (ref 135–145)
Total Bilirubin: 0.5 mg/dL (ref 0.2–1.2)
Total Protein: 7.1 g/dL (ref 6.0–8.3)

## 2024-03-11 LAB — LIPID PANEL
Cholesterol: 190 mg/dL (ref 0–200)
HDL: 47.4 mg/dL (ref >39.00)
LDL Cholesterol: 108 mg/dL — ABNORMAL HIGH (ref 0–99)
NonHDL: 142.23
Total CHOL/HDL Ratio: 4
Triglycerides: 169 mg/dL — ABNORMAL HIGH (ref 0.0–149.0)
VLDL: 33.8 mg/dL (ref 0.0–40.0)

## 2024-03-11 LAB — HEMOGLOBIN A1C: Hgb A1c MFr Bld: 6 % (ref 4.6–6.5)

## 2024-03-11 LAB — MAGNESIUM: Magnesium: 1.7 mg/dL (ref 1.5–2.5)

## 2024-03-11 NOTE — Assessment & Plan Note (Signed)
 She is currently taking a daily vitamin D  supplement over-the-counter.  Most recent vitamin D  normal. Continue current regimen.

## 2024-03-11 NOTE — Assessment & Plan Note (Signed)
Chronic, stable. Will check A1c today.  

## 2024-03-11 NOTE — Assessment & Plan Note (Signed)
Health maintenance reviewed and updated. Discussed nutrition, exercise. Follow-up 1 year.

## 2024-03-11 NOTE — Assessment & Plan Note (Signed)
 Noted on CT scan 05/05/23. Continue simvastatin 40mg  daily.

## 2024-03-11 NOTE — Assessment & Plan Note (Signed)
 Most recent DEXA 03/06/24 showed T score improved to -2.2. Her DEXA on 12/24/2021 showed a T-score -3 in her wrist. Will have her continue Fosamax  70 mg daily and recheck DEXA scan in 2 years. Vitamin D  levels are normal.

## 2024-03-11 NOTE — Patient Instructions (Signed)
 It was great to see you!  We are checking your labs today and will let you know the results via mychart/phone.   We update your Tetanus today   Keep taking your medications   Let's follow-up in 1 year, sooner if you have concerns.  If a referral was placed today, you will be contacted for an appointment. Please note that routine referrals can sometimes take up to 3-4 weeks to process. Please call our office if you haven't heard anything after this time frame.  Take care,  Tinnie Harada, NP

## 2024-03-11 NOTE — Assessment & Plan Note (Signed)
 Continue Simvastatin  40mg  daily. Check CMP, CBC, lipid panel today and adjust based on results.

## 2024-03-11 NOTE — Assessment & Plan Note (Signed)
 Noted in the right eye. She is following with ophthalmology. Blood pressure controlled.

## 2024-03-11 NOTE — Progress Notes (Signed)
 BP 122/68 (BP Location: Right Arm, Cuff Size: Normal)   Pulse 65   Temp 98.1 F (36.7 C) (Oral)   Ht 5' 7 (1.702 m)   Wt 163 lb 12.8 oz (74.3 kg)   SpO2 98%   BMI 25.65 kg/m    Subjective:    Patient ID: Lori Cook, female    DOB: 1950/12/04, 73 y.o.   MRN: [id number]  CC: Chief Complaint  Patient presents with   Annual Exam    HPI: Lori Cook is a 73 y.o. female presenting on 03/11/2024 for comprehensive medical examination. Current medical complaints include:none  She currently lives with: daughter Menopausal Symptoms: no  Depression and Anxiety Screen done today and results listed below:     03/11/2024    8:19 AM 06/12/2023    2:01 PM 04/17/2023    1:12 PM 11/21/2022    8:12 AM 08/26/2022   12:03 PM  Depression screen PHQ 2/9  Decreased Interest 0 0 0 0 0  Down, Depressed, Hopeless 0 0 0 0 0  PHQ - 2 Score 0 0 0 0 0  Altered sleeping 0 0 0 0   Tired, decreased energy 0 0 0 0   Change in appetite 0 0 0 0   Feeling bad or failure about yourself  0 0 0 0   Trouble concentrating 0 0 0 0   Moving slowly or fidgety/restless 0 0 0 0   Suicidal thoughts 0 0 0 0   PHQ-9 Score 0 0 0 0   Difficult doing work/chores  Not difficult at all Not difficult at all Not difficult at all       03/11/2024    8:20 AM 04/17/2023    1:12 PM 11/21/2022    8:12 AM  GAD 7 : Generalized Anxiety Score  Nervous, Anxious, on Edge 0 0 0  Control/stop worrying 0 0 0  Worry too much - different things 0 0 0  Trouble relaxing 0 0 0  Restless 0 0 0  Easily annoyed or irritable 0 0 0  Afraid - awful might happen 0 0 0  Total GAD 7 Score 0 0 0  Anxiety Difficulty  Not difficult at all Not difficult at all    The patient does not have a history of falls. I did not complete a risk assessment for falls. A plan of care for falls was not documented.   Past Medical History:  Past Medical History:  Diagnosis Date   Allergy    Arthritis    Basal cell carcinoma    Blood  transfusion without reported diagnosis    Cataract    Cholelithiasis    large stone detected by CT 01/2016   GERD (gastroesophageal reflux disease)    Hyperlipidemia    Hypertension    Nephrolithiasis    2mm kidney stone L by CT 01/2016   Osteopenia    June 2025 per patient   Osteoporosis    Retinal vein occlusion    right eye per patient   Retinal vein occlusion, 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    Surgical History:  Past Surgical History:  Procedure Laterality Date   ABDOMINAL HYSTERECTOMY     APPENDECTOMY     BASAL CELL CARCINOMA EXCISION     [facility] January 2025   CATARACT EXTRACTION Bilateral 12/2023   CHOLECYSTECTOMY     COLONOSCOPY     [facility]- records purges and destroyed- per pt Normal    JOINT REPLACEMENT N/A  [employer] 01/08/2021   SPINE SURGERY     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   Procedure: LEFT TOTAL HIP ARTHROPLASTY ANTERIOR APPROACH;  Surgeon: Jerri Kay HERO, MD;  Location: [facility] OR;  Service: Orthopedics;  Laterality: Left;   TOTAL HIP ARTHROPLASTY Right 06/15/2020   Procedure: RIGHT TOTAL HIP ARTHROPLASTY ANTERIOR APPROACH;  Surgeon: Jerri Kay HERO, MD;  Location: [facility] OR;  Service: Orthopedics;  Laterality: Right;    Medications:  Current Outpatient Medications on File Prior to Visit  Medication Sig   acetaminophen  (TYLENOL ) 500 MG tablet Take 1,000 mg by mouth every 6 (six) hours as needed for moderate pain.    alendronate  (FOSAMAX ) 70 MG tablet Take 1 tablet (70 mg total) by mouth once a week.   amLODipine  (NORVASC ) 10 MG tablet Take 1 tablet by mouth once daily   aspirin  EC 81 MG tablet Take 81 mg by mouth daily. Swallow whole.   Cholecalciferol (VITAMIN D3 PO) Take by mouth.   fluticasone  (FLONASE ) 50 MCG/ACT nasal spray Place 2 sprays into both nostrils daily.   gabapentin  (NEURONTIN ) 100 MG capsule Take 1 capsule by mouth at bedtime   hydrochlorothiazide  (HYDRODIURIL ) 25 MG tablet Take 1 tablet by mouth once daily   naproxen sodium  (ALEVE) 220 MG tablet Take 440 mg by mouth 2 (two) times daily as needed (pain).   omeprazole  (PRILOSEC) 40 MG capsule Take 1 capsule by mouth once daily   simvastatin  (ZOCOR ) 40 MG tablet Take 1 tablet by mouth once daily with breakfast   No current facility-administered medications on file prior to visit.    Allergies:  Allergies  Allergen Reactions   Other    Tramadol Nausea And Vomiting   Codeine Other (See Comments)    Headache   Oxycodone  Palpitations    Social History:  Social History   Socioeconomic History   Marital status: Single    Spouse name: Not on file   Number of children: Not on file   Years of education: Not on file   Highest education level: Bachelor's degree (e.g., BA, AB, BS)  Occupational History   Not on file  Tobacco Use   Smoking status: Former    Current packs/day: 0.00    Average packs/day: 1.5 packs/day for 40.0 years (60.0 ttl pk-yrs)    Types: Cigarettes    Start date: 23    Quit date: 09/08/2010    Years since quitting: 13.5   Smokeless tobacco: Never  Vaping Use   Vaping status: Never Used  Substance and Sexual Activity   Alcohol  use: No   Drug use: Never   Sexual activity: Not Currently  Other Topics Concern   Not on file  Social History Narrative   Not on file   Social Drivers of Health   Financial Resource Strain: Low Risk  (03/07/2024)   Overall Financial Resource Strain (CARDIA)    Difficulty of Paying Living Expenses: Not hard at all  Food Insecurity: No Food Insecurity (03/07/2024)   Hunger Vital Sign    Worried About Running Out of Food in the Last Year: Never true    Ran Out of Food in the Last Year: Never true  Transportation Needs: No Transportation Needs (03/07/2024)   PRAPARE - Administrator, Civil Service (Medical): No    Lack of Transportation (Non-Medical): No  Physical Activity: Unknown (03/11/2024)   Exercise Vital Sign    Days of Exercise per Week: 5 days    Minutes of Exercise per Session:  Not  on file  Stress: No Stress Concern Present (03/07/2024)   [employer] of Occupational Health - Occupational Stress Questionnaire    Feeling of Stress: Not at all  Social Connections: Moderately Integrated (03/07/2024)   Social Connection and Isolation Panel    Frequency of Communication with Friends and Family: More than three times a week    Frequency of Social Gatherings with Friends and Family: Once a week    Attends Religious Services: More than 4 times per year    Active Member of [employer] or Organizations: Yes    Attends Engineer, structural: More than 4 times per year    Marital Status: Divorced  Intimate Partner Violence: Not At Risk (03/11/2024)   Humiliation, Afraid, Rape, and Kick questionnaire    Fear of Current or Ex-Partner: No    Emotionally Abused: No    Physically Abused: No    Sexually Abused: No   Social History   Tobacco Use  Smoking Status Former   Current packs/day: 0.00   Average packs/day: 1.5 packs/day for 40.0 years (60.0 ttl pk-yrs)   Types: Cigarettes   Start date: 89   Quit date: 09/08/2010   Years since quitting: 13.5  Smokeless Tobacco Never   Social History   Substance and Sexual Activity  Alcohol  Use No    Family History:  Family History  Problem Relation Age of Onset   Mental illness Mother    Stroke Father    Diabetes Father    Heart disease Father 54       AMI   Hypertension Sister    Hypertension Brother    Arthritis Brother    ADD / ADHD Daughter    Heart disease Paternal Uncle    Colon cancer Neg Hx    Colon polyps Neg Hx    Esophageal cancer Neg Hx    Rectal cancer Neg Hx    Stomach cancer Neg Hx     Past medical history, surgical history, medications, allergies, family history and social history reviewed with patient today and changes made to appropriate areas of the chart.   Review of Systems  Constitutional: Negative.   HENT: Negative.    Eyes: Negative.   Respiratory:  Positive for cough.  Negative for shortness of breath and wheezing.   Cardiovascular: Negative.   Gastrointestinal: Negative.   Genitourinary: Negative.   Musculoskeletal:  Positive for back pain. Negative for myalgias.  Skin: Negative.        Injury left forearm  Neurological: Negative.   Psychiatric/Behavioral: Negative.     All other ROS negative except what is listed above and in the HPI.      Objective:    BP 122/68 (BP Location: Right Arm, Cuff Size: Normal)   Pulse 65   Temp 98.1 F (36.7 C) (Oral)   Ht 5' 7 (1.702 m)   Wt 163 lb 12.8 oz (74.3 kg)   SpO2 98%   BMI 25.65 kg/m   Wt Readings from Last 3 Encounters:  03/11/24 163 lb 12.8 oz (74.3 kg)  02/26/24 165 lb (74.8 kg)  09/12/23 160 lb 6.4 oz (72.8 kg)    Physical Exam Vitals and nursing note reviewed.  Constitutional:      General: She is not in acute distress.    Appearance: Normal appearance.  HENT:     Head: Normocephalic and atraumatic.     Right Ear: Tympanic membrane, ear canal and external ear normal.     Left Ear: Tympanic membrane, ear canal  and external ear normal.     Mouth/Throat:     Mouth: Mucous membranes are moist.     Pharynx: No posterior oropharyngeal erythema.   Eyes:     Conjunctiva/sclera: Conjunctivae normal.    Cardiovascular:     Rate and Rhythm: Normal rate and regular rhythm.     Pulses: Normal pulses.     Heart sounds: Normal heart sounds.  Pulmonary:     Effort: Pulmonary effort is normal.     Breath sounds: Normal breath sounds.  Abdominal:     Palpations: Abdomen is soft.     Tenderness: There is no abdominal tenderness.   Musculoskeletal:        General: Normal range of motion.     Cervical back: Normal range of motion and neck supple.     Right lower leg: No edema.     Left lower leg: No edema.  Lymphadenopathy:     Cervical: No cervical adenopathy.   Skin:    General: Skin is warm and dry.     Comments: Healing wound to right forearm, no signs of infection   Neurological:      General: No focal deficit present.     Mental Status: She is alert and oriented to person, place, and time.     Cranial Nerves: No cranial nerve deficit.     Coordination: Coordination normal.     Gait: Gait normal.   Psychiatric:        Mood and Affect: Mood normal.        Behavior: Behavior normal.        Thought Content: Thought content normal.        Judgment: Judgment normal.     Results for orders placed or performed in visit on 03/04/24  HM DEXA SCAN   Collection Time: 03/01/24 10:26 AM  Result Value Ref Range   HM Dexa Scan OSTEOPENIA       Assessment & Plan:   Problem List Items Addressed This Visit       Cardiovascular and Mediastinum   Retinal vein occlusion   Noted in the right eye. She is following with ophthalmology. Blood pressure controlled.       Essential hypertension, benign   Chronic, stable.  Continue amlodipine  10 mg daily and HCTZ 25 mg daily.  Check CMP, CBC today.       Relevant Orders   CBC with Differential/Platelet   Comprehensive metabolic panel with GFR   Aortic atherosclerosis (HCC)   Noted on CT scan 05/05/23. Continue simvastatin  40mg  daily.         Respiratory   Centrilobular emphysema (HCC)   Noted on CT scan 05/05/23. No current symptoms. Will continue to monitor.         Endocrine   Primary hyperparathyroidism (HCC)     Musculoskeletal and Integument   Osteoporosis without current pathological fracture   Most recent DEXA 03/06/24 showed T score improved to -2.2. Her DEXA on 12/24/2021 showed a T-score -3 in her wrist. Will have her continue Fosamax  70 mg daily and recheck DEXA scan in 2 years. Vitamin D  levels are normal.         Other   Hyperlipidemia with target LDL less than 130   Continue Simvastatin  40mg  daily. Check CMP, CBC, lipid panel today and adjust based on results.       Relevant Orders   CBC with Differential/Platelet   Comprehensive metabolic panel with GFR   Lipid panel   Vitamin D   insufficiency    She is currently taking a daily vitamin D  supplement over-the-counter.  Most recent vitamin D  normal. Continue current regimen.       Prediabetes   Chronic, stable. Will check A1c today.      Relevant Orders   Hemoglobin A1c   Routine general medical examination at a health care facility - Primary   Health maintenance reviewed and updated. Discussed nutrition, exercise. Follow-up 1 year.        Other Visit Diagnoses       Open wound of right forearm, initial encounter       Healing, no signs of infection. Will update Td today.   Relevant Orders   Td vaccine greater than or equal to 7yo preservative free IM (Completed)     Muscle cramp       Occured for a few nights. Check CMP, Mg today. Encourage fluids.   Relevant Orders   Comprehensive metabolic panel with GFR   Magnesium         Follow up plan: Return in about 1 year (around 03/11/2025) for CPE.   LABORATORY TESTING:  - Pap smear: not applicable  IMMUNIZATIONS:   - Tdap: Tetanus vaccination status reviewed: Td vaccination indicated and given today. - Influenza: Postponed to flu season - Pneumovax: Not applicable - Prevnar: Not applicable - HPV: Not applicable - Shingrix vaccine: Up to date  SCREENING: -Mammogram: scheduled for August  - Colonoscopy: Up to date  - Bone Density: Up to date   PATIENT COUNSELING:   Advised to take 1 mg of folate supplement per day if capable of pregnancy.   Sexuality: Discussed sexually transmitted diseases, partner selection, use of condoms, avoidance of unintended pregnancy  and contraceptive alternatives.   Advised to avoid cigarette smoking.  I discussed with the patient that most people either abstain from alcohol  or drink within safe limits (<=14/week and <=4 drinks/occasion for males, <=7/weeks and <= 3 drinks/occasion for females) and that the risk for alcohol  disorders and other health effects rises proportionally with the number of drinks per week and how often a  drinker exceeds daily limits.  Discussed cessation/primary prevention of drug use and availability of treatment for abuse.   Diet: Encouraged to adjust caloric intake to maintain  or achieve ideal body weight, to reduce intake of dietary saturated fat and total fat, to limit sodium intake by avoiding high sodium foods and not adding table salt, and to maintain adequate dietary potassium and calcium preferably from fresh fruits, vegetables, and low-fat dairy products.    stressed the importance of regular exercise  Injury prevention: Discussed safety belts, safety helmets, smoke detector, smoking near bedding or upholstery.   Dental health: Discussed importance of regular tooth brushing, flossing, and dental visits.    NEXT PREVENTATIVE PHYSICAL DUE IN 1 YEAR. Return in about 1 year (around 03/11/2025) for CPE.  Siddh Vandeventer A Gwendola Hornaday

## 2024-03-11 NOTE — Assessment & Plan Note (Signed)
 Noted on CT scan 05/05/23. No current symptoms. Will continue to monitor.

## 2024-03-11 NOTE — Assessment & Plan Note (Signed)
 Chronic, stable.  Continue amlodipine 10 mg daily and HCTZ 25 mg daily. Check CMP, CBC today.

## 2024-03-12 ENCOUNTER — Ambulatory Visit: Payer: Self-pay | Admitting: Nurse Practitioner

## 2024-03-12 MED ORDER — MAGNESIUM OXIDE 400 MG PO TABS: 400.0000 mg | ORAL_TABLET | Freq: Every day | ORAL | 0 refills | Status: DC

## 2024-03-12 MED ORDER — POTASSIUM CHLORIDE CRYS ER 20 MEQ PO TBCR: 40.0000 meq | EXTENDED_RELEASE_TABLET | Freq: Every day | ORAL | 0 refills | Status: DC

## 2024-03-19 DIAGNOSIS — H348311 Tributary (branch) retinal vein occlusion, right eye, with retinal neovascularization: Secondary | ICD-10-CM | POA: Diagnosis not present

## 2024-04-01 ENCOUNTER — Encounter: Payer: Self-pay | Admitting: Family Medicine

## 2024-04-01 ENCOUNTER — Ambulatory Visit (INDEPENDENT_AMBULATORY_CARE_PROVIDER_SITE_OTHER): Admitting: Family Medicine

## 2024-04-01 ENCOUNTER — Ambulatory Visit: Payer: Self-pay

## 2024-04-01 VITALS — BP 124/64 | HR 65 | Temp 97.6°F | Ht 67.0 in | Wt 162.6 lb

## 2024-04-01 DIAGNOSIS — M7552 Bursitis of left shoulder: Secondary | ICD-10-CM

## 2024-04-01 MED ORDER — MELOXICAM 7.5 MG PO TABS: 7.5000 mg | ORAL_TABLET | Freq: Every day | ORAL | 0 refills | Status: DC

## 2024-04-01 NOTE — Telephone Encounter (Signed)
 FYI Only or Action Required?: FYI only for provider.  Patient was last seen in primary care on 03/11/2024 by Nedra Tinnie LABOR, NP.  Called Nurse Triage reporting Shoulder Pain.  Symptoms began several weeks ago.  Interventions attempted: OTC medications: ibuprofen  and Tylenol  and Ice/heat application.  Symptoms are: gradually worsening.  Triage Disposition: See PCP When Office is Open (Within 3 Days)  Patient/caregiver understands and will follow disposition?: Yes                             Copied from CRM [id number]. Topic: Clinical - Red Word Triage >> Apr 01, 2024  9:51 AM Suzen RAMAN wrote: Red Word that prompted transfer to Nurse Triage: worsening  shoulder pain(3 weeks)  not improving with medication Reason for Disposition  [1] MODERATE pain (e.g., interferes with normal activities) AND [2] present > 3 days  Answer Assessment - Initial Assessment Questions 1. ONSET: When did the pain start?     At least 3 weeks ago 2. LOCATION: Where is the pain located?     Inside of left shoulder 3. PAIN: How bad is the pain? (Scale 1-10; or mild, moderate, severe)     Rates pain a 6-7, describes pain as dull and achy 4. WORK OR EXERCISE: Has there been any recent work or exercise that involved this part of the body?     Unsure 5. CAUSE: What do you think is causing the shoulder pain?     Unsure, maybe lifted arm during sleep 6. OTHER SYMPTOMS: Do you have any other symptoms? (e.g., neck pain, swelling, rash, fever, numbness, weakness)     Denies numbness/weakness, denies rash, denies fever, denies redness, denies swelling         Tylenol  and ibuprofen  are no longer providing relief. States she still has ROM in this arm.  Protocols used: Shoulder Pain-A-AH

## 2024-04-01 NOTE — Progress Notes (Signed)
 Established Patient Office Visit   Subjective:  Patient ID: Lori Cook, female    DOB: 02/11/1951  Age: 73 y.o. MRN: [id number]  Chief Complaint  Patient presents with   Shoulder Pain    Left shoulder pain x 2-3 week. Tried tylenol , ibuprofen  and heat therapy. Mobility is normal, no know injury, tingling or numbness.     Shoulder Pain  Pertinent negatives include no tingling.   Encounter Diagnoses  Name Primary?   Bursitis of left shoulder Yes   2 to 3-week history of left shoulder pain.  No injury.  Right-hand-dominant.  Continues to work cleaning houses.  No prior shoulder issues.  Denies neck pain, numbness or weakness in the arm.  Pain radiates sometimes down into the upper arm.   Review of Systems  Constitutional: Negative.   HENT: Negative.    Eyes:  Negative for blurred vision, discharge and redness.  Respiratory: Negative.    Cardiovascular: Negative.   Gastrointestinal:  Negative for abdominal pain.  Genitourinary: Negative.   Musculoskeletal:  Positive for joint pain. Negative for myalgias and neck pain.  Skin:  Negative for rash.  Neurological:  Negative for tingling, loss of consciousness and weakness.  Endo/Heme/Allergies:  Negative for polydipsia.     Current Outpatient Medications:    acetaminophen  (TYLENOL ) 500 MG tablet, Take 1,000 mg by mouth every 6 (six) hours as needed for moderate pain. , Disp: , Rfl:    alendronate  (FOSAMAX ) 70 MG tablet, Take 1 tablet (70 mg total) by mouth once a week., Disp: 12 tablet, Rfl: 3   amLODipine  (NORVASC ) 10 MG tablet, Take 1 tablet by mouth once daily, Disp: 90 tablet, Rfl: 0   aspirin  EC 81 MG tablet, Take 81 mg by mouth daily. Swallow whole., Disp: , Rfl:    Cholecalciferol (VITAMIN D3 PO), Take by mouth., Disp: , Rfl:    fluticasone  (FLONASE ) 50 MCG/ACT nasal spray, Place 2 sprays into both nostrils daily., Disp: 1 g, Rfl: 0   gabapentin  (NEURONTIN ) 100 MG capsule, Take 1 capsule by mouth at bedtime, Disp: 90  capsule, Rfl: 0   hydrochlorothiazide  (HYDRODIURIL ) 25 MG tablet, Take 1 tablet by mouth once daily, Disp: 90 tablet, Rfl: 0   meloxicam  (MOBIC ) 7.5 MG tablet, Take 1 tablet (7.5 mg total) by mouth daily., Disp: 30 tablet, Rfl: 0   naproxen sodium (ALEVE) 220 MG tablet, Take 440 mg by mouth 2 (two) times daily as needed (pain)., Disp: , Rfl:    omeprazole  (PRILOSEC) 40 MG capsule, Take 1 capsule by mouth once daily, Disp: 90 capsule, Rfl: 1   simvastatin  (ZOCOR ) 40 MG tablet, Take 1 tablet by mouth once daily with breakfast, Disp: 90 tablet, Rfl: 0   Objective:     BP 124/64   Pulse 65   Temp 97.6 F (36.4 C)   Ht 5' 7 (1.702 m)   Wt 162 lb 9.6 oz (73.8 kg)   SpO2 96%   BMI 25.47 kg/m    Physical Exam Constitutional:      General: She is not in acute distress.    Appearance: Normal appearance. She is not ill-appearing, toxic-appearing or diaphoretic.  HENT:     Head: Normocephalic and atraumatic.     Right Ear: External ear normal.     Left Ear: External ear normal.  Eyes:     General: No scleral icterus.       Right eye: No discharge.        Left eye: No discharge.  Extraocular Movements: Extraocular movements intact.     Conjunctiva/sclera: Conjunctivae normal.  Pulmonary:     Effort: Pulmonary effort is normal. No respiratory distress.  Musculoskeletal:     Left shoulder: Tenderness present. Normal range of motion.       Arms:     Cervical back: No bony tenderness. No pain with movement.       Back:  Skin:    General: Skin is warm and dry.  Neurological:     Mental Status: She is alert and oriented to person, place, and time.  Psychiatric:        Mood and Affect: Mood normal.        Behavior: Behavior normal.      No results found for any visits on 04/01/24.    The 10-year ASCVD risk score (Arnett DK, et al., 2019) is: 13.3%    Assessment & Plan:   Bursitis of left shoulder -     Meloxicam ; Take 1 tablet (7.5 mg total) by mouth daily.  Dispense:  30 tablet; Refill: 0 -     DG Shoulder Left; Future    Return in about 4 weeks (around 04/29/2024), or if symptoms worsen or fail to improve.    Elsie Sim Lent, MD

## 2024-04-02 ENCOUNTER — Encounter: Payer: Self-pay | Admitting: Family Medicine

## 2024-04-04 ENCOUNTER — Other Ambulatory Visit: Payer: Self-pay | Admitting: Nurse Practitioner

## 2024-04-04 DIAGNOSIS — I10 Essential (primary) hypertension: Secondary | ICD-10-CM

## 2024-04-04 DIAGNOSIS — E785 Hyperlipidemia, unspecified: Secondary | ICD-10-CM

## 2024-04-04 NOTE — Telephone Encounter (Signed)
 Requesting: amLODIPine  Besylate 10 MG Oral Tablet, hydroCHLOROthiazide  25 MG Oral Tablet , Simvastatin  40 MG Oral Tablet  Last Visit: 03/11/2024 Next Visit: 04/29/2024 Last Refill: 01/12/2024  Please Advise

## 2024-04-08 ENCOUNTER — Ambulatory Visit: Admitting: Internal Medicine

## 2024-04-20 DIAGNOSIS — Z1231 Encounter for screening mammogram for malignant neoplasm of breast: Secondary | ICD-10-CM | POA: Diagnosis not present

## 2024-04-20 LAB — HM MAMMOGRAPHY

## 2024-04-23 ENCOUNTER — Encounter: Payer: Self-pay | Admitting: Nurse Practitioner

## 2024-04-26 DIAGNOSIS — H348311 Tributary (branch) retinal vein occlusion, right eye, with retinal neovascularization: Secondary | ICD-10-CM | POA: Diagnosis not present

## 2024-04-29 ENCOUNTER — Ambulatory Visit: Admitting: Nurse Practitioner

## 2024-04-29 ENCOUNTER — Encounter: Payer: Self-pay | Admitting: Nurse Practitioner

## 2024-04-29 VITALS — BP 122/64 | HR 83 | Temp 97.4°F | Ht 67.0 in | Wt 163.2 lb

## 2024-04-29 DIAGNOSIS — M25512 Pain in left shoulder: Secondary | ICD-10-CM | POA: Diagnosis not present

## 2024-04-29 DIAGNOSIS — H34831 Tributary (branch) retinal vein occlusion, right eye, with macular edema: Secondary | ICD-10-CM | POA: Diagnosis not present

## 2024-04-29 DIAGNOSIS — R519 Headache, unspecified: Secondary | ICD-10-CM

## 2024-04-29 DIAGNOSIS — G8929 Other chronic pain: Secondary | ICD-10-CM | POA: Diagnosis not present

## 2024-04-29 DIAGNOSIS — F17211 Nicotine dependence, cigarettes, in remission: Secondary | ICD-10-CM

## 2024-04-29 MED ORDER — DIAZEPAM 5 MG PO TABS: 5.0000 mg | ORAL_TABLET | Freq: Once | ORAL | 0 refills | Status: AC

## 2024-04-29 NOTE — Assessment & Plan Note (Signed)
 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 Dry eye and post-surgical changes may contribute. Advise using Tylenol  as needed, not daily, to avoid rebound headaches. Recommend warm or cool compresses for headache relief. Encourage rest and eye relaxation. Use Refresh eye drops more frequently. Monitor headache frequency and severity, especially post-laser procedure.

## 2024-04-29 NOTE — Assessment & Plan Note (Signed)
 She is planning laser surgery with ophthalmologist. Will give valium  5mg  once 30 mins before procedure. Will need someone to drive her to and from procedure.

## 2024-04-29 NOTE — Patient Instructions (Signed)
 It was great to see you!  You can try eye compresses when you get a headache   Keep taking tylenol  as needed, try not to take it every day  Let's follow-up with any worsening concerns or symptoms   Take care,  Tinnie Harada, NP

## 2024-04-29 NOTE — Assessment & Plan Note (Signed)
 Chronic, stable. Continue meloxicam  7.5mg  daily with food as needed.

## 2024-04-29 NOTE — Progress Notes (Signed)
 Established Patient Office Visit  Subjective   Patient ID: Lori Cook, female    DOB: 27-Nov-1950  Age: 73 y.o. MRN: [id number]  Chief Complaint  Patient presents with   Shoulder Pain    Follow up Left shoulder pain, concerns with headaches since eye surgery   HPI:   Discussed the use of AI scribe software for clinical note transcription with the patient, who gave verbal consent to proceed.  History of Present Illness   Lori Cook is a 73 year old female who presents with headaches.  Headaches began after cataract surgery, primarily frontal, sometimes extending posteriorly, occurring mid-morning around 10 AM. Relief is achieved with two Tylenol , and the headaches are gradually improving. She reports blurry vision, a floater, difficulty seeing the TV at night, and occasional eye strain, using Refresh eye drops more frequently.  She has retinal vein occlusion and is scheduled for laser surgery to address potential retinal bleeders. in a few weeks. She is a little anxious about this. She is wondering if she can have 1 dose of valium  before the procedure.   Shoulder pain a month ago was treated with meloxicam , with significant improvement. Mild discomfort occasionally occurs at work.        ROS See pertinent positives and negatives per HPI.    Objective:     BP 122/64 (BP Location: Left Arm, Patient Position: Sitting, Cuff Size: Normal)   Pulse 83   Temp (!) 97.4 F (36.3 C)   Ht 5' 7 (1.702 m)   Wt 163 lb 3.2 oz (74 kg)   SpO2 96%   BMI 25.56 kg/m    Physical Exam Vitals and nursing note reviewed.  Constitutional:      General: She is not in acute distress.    Appearance: Normal appearance.  HENT:     Head: Normocephalic.  Eyes:     Conjunctiva/sclera: Conjunctivae normal.  Cardiovascular:     Rate and Rhythm: Normal rate and regular rhythm.     Pulses: Normal pulses.     Heart sounds: Normal heart sounds.  Pulmonary:     Effort: Pulmonary  effort is normal.     Breath sounds: Normal breath sounds.  Musculoskeletal:     Cervical back: Normal range of motion.  Skin:    General: Skin is warm.  Neurological:     General: No focal deficit present.     Mental Status: She is alert and oriented to person, place, and time.  Psychiatric:        Mood and Affect: Mood normal.        Behavior: Behavior normal.        Thought Content: Thought content normal.        Judgment: Judgment normal.    The 10-year ASCVD risk score (Arnett DK, et al., 2019) is: 12.9%    Assessment & Plan:   Problem List Items Addressed This Visit       Cardiovascular and Mediastinum   Retinal vein occlusion - Primary   She is planning laser surgery with ophthalmologist. Will give valium  5mg  once 30 mins before procedure. Will need someone to drive her to and from procedure.        Other   Cigarette nicotine dependence in remission   She has a 40-pack-year history of smoking and quit smoking 10 years ago.  Congratulated her on this.  Will check a low-dose CT scan to screen for lung cancer.      Relevant Orders  CT CHEST LUNG CANCER SCREENING LOW DOSE WO CONTRAST   Nonintractable episodic headache   Chronic frontal headaches have increased post-cataract surgery, possibly due to rebound headaches from daily Tylenol  use. Dry eye and post-surgical changes may contribute. Advise using Tylenol  as needed, not daily, to avoid rebound headaches. Recommend warm or cool compresses for headache relief. Encourage rest and eye relaxation. Use Refresh eye drops more frequently. Monitor headache frequency and severity, especially post-laser procedure.       Chronic left shoulder pain   Chronic, stable. Continue meloxicam  7.5mg  daily with food as needed.        Return if symptoms worsen or fail to improve.    Tinnie DELENA Harada, NP

## 2024-04-29 NOTE — Assessment & Plan Note (Signed)
She has a 40-pack-year history of smoking and quit smoking 10 years ago.  Congratulated her on this.  Will check a low-dose CT scan to screen for lung cancer.

## 2024-05-17 ENCOUNTER — Other Ambulatory Visit: Payer: Self-pay | Admitting: Nurse Practitioner

## 2024-05-17 DIAGNOSIS — M79604 Pain in right leg: Secondary | ICD-10-CM

## 2024-06-07 DIAGNOSIS — H348311 Tributary (branch) retinal vein occlusion, right eye, with retinal neovascularization: Secondary | ICD-10-CM | POA: Diagnosis not present

## 2024-06-17 ENCOUNTER — Ambulatory Visit: Payer: PPO

## 2024-06-17 DIAGNOSIS — Z0000 Encounter for general adult medical examination without abnormal findings: Secondary | ICD-10-CM

## 2024-06-17 NOTE — Progress Notes (Signed)
 Subjective:   Lori Cook is a 73 y.o. who presents for a Medicare Wellness preventive visit.  As a reminder, Annual Wellness Visits don't include a physical exam, and some assessments may be limited, especially if this visit is performed virtually. We may recommend an in-person follow-up visit with your provider if needed.  Visit Complete: Virtual I connected with  Lori Cook on 06/17/24 by a audio enabled telemedicine application and verified that I am speaking with the correct person using two identifiers.  Patient Location: Home  Provider Location: Office/Clinic  I discussed the limitations of evaluation and management by telemedicine. The patient expressed understanding and agreed to proceed.  Vital Signs: Because this visit was a virtual/telehealth visit, some criteria may be missing or patient reported. Any vitals not documented were not able to be obtained and vitals that have been documented are patient reported.  VideoError- Librarian, academic were attempted between this provider and patient, however failed, due to patient having technical difficulties OR patient did not have access to video capability.  We continued and completed visit with audio only.   Persons Participating in Visit: Patient.  AWV Questionnaire: Yes: Patient Medicare AWV questionnaire was completed by the patient on 06/16/2024; I have confirmed that all information answered by patient is correct and no changes since this date.  Cardiac Risk Factors include: advanced age (>43men, >47 women);dyslipidemia;hypertension     Objective:    Today's Vitals   There is no height or weight on file to calculate BMI.     06/17/2024    2:20 PM 06/12/2023    2:00 PM 08/26/2022   12:02 PM 02/22/2021    9:02 AM 06/15/2020    2:20 PM 06/15/2020    8:41 AM 06/11/2020    1:03 PM  Advanced Directives  Does Patient Have a Medical Advance Directive? Yes No No No No No No  Type of  Estate agent of [place];Living will        Copy of Healthcare Power of Attorney in Chart? No - copy requested        Would patient like information on creating a medical advance directive?    Yes (Inpatient - patient defers creating a medical advance directive at this time - Information given) No - Patient declined      Current Medications (verified) Outpatient Encounter Medications as of 06/17/2024  Medication Sig   acetaminophen  (TYLENOL ) 500 MG tablet Take 1,000 mg by mouth every 6 (six) hours as needed for moderate pain.    alendronate  (FOSAMAX ) 70 MG tablet Take 1 tablet (70 mg total) by mouth once a week.   amLODipine  (NORVASC ) 10 MG tablet Take 1 tablet by mouth once daily   aspirin  EC 81 MG tablet Take 81 mg by mouth daily. Swallow whole.   Cholecalciferol (VITAMIN D3 PO) Take by mouth.   fluticasone  (FLONASE ) 50 MCG/ACT nasal spray Place 2 sprays into both nostrils daily.   gabapentin  (NEURONTIN ) 100 MG capsule Take 1 capsule by mouth at bedtime   hydrochlorothiazide  (HYDRODIURIL ) 25 MG tablet Take 1 tablet by mouth once daily   naproxen sodium (ALEVE) 220 MG tablet Take 440 mg by mouth 2 (two) times daily as needed (pain).   omeprazole  (PRILOSEC) 40 MG capsule Take 1 capsule by mouth once daily   simvastatin  (ZOCOR ) 40 MG tablet Take 1 tablet by mouth once daily with breakfast   meloxicam  (MOBIC ) 7.5 MG tablet Take 1 tablet (7.5 mg total) by mouth  daily.   No facility-administered encounter medications on file as of 06/17/2024.    Allergies (verified) Other, Tramadol, Codeine, and Oxycodone    History: Past Medical History:  Diagnosis Date   Allergy    Arthritis    Basal cell carcinoma    Blood transfusion without reported diagnosis    Cataract    Cholelithiasis    large stone detected by CT 01/2016   GERD (gastroesophageal reflux disease)    Hyperlipidemia    Hypertension    Nephrolithiasis    2mm kidney stone L by CT 01/2016   Osteopenia     June 2025 per patient   Osteoporosis    Retinal vein occlusion (HCC)    right eye per patient   Retinal vein occlusion, branch (HCC) 02/11/2015   Past Surgical History:  Procedure Laterality Date   ABDOMINAL HYSTERECTOMY     APPENDECTOMY     BASAL CELL CARCINOMA EXCISION     [facility] January 2025   CATARACT EXTRACTION Bilateral 12/2023   CHOLECYSTECTOMY     COLONOSCOPY     [facility]- records purges and destroyed- per pt Normal    JOINT REPLACEMENT N/A    [employer] 01/08/2021   SPINE SURGERY     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   Procedure: LEFT TOTAL HIP ARTHROPLASTY ANTERIOR APPROACH;  Surgeon: Jerri Kay HERO, MD;  Location: [facility] OR;  Service: Orthopedics;  Laterality: Left;   TOTAL HIP ARTHROPLASTY Right 06/15/2020   Procedure: RIGHT TOTAL HIP ARTHROPLASTY ANTERIOR APPROACH;  Surgeon: Jerri Kay HERO, MD;  Location: [facility] OR;  Service: Orthopedics;  Laterality: Right;   Family History  Problem Relation Age of Onset   Mental illness Mother    Stroke Father    Diabetes Father    Heart disease Father 56       AMI   Hypertension Sister    Hypertension Brother    Arthritis Brother    ADD / ADHD Daughter    Heart disease Paternal Uncle    Colon cancer Neg Hx    Colon polyps Neg Hx    Esophageal cancer Neg Hx    Rectal cancer Neg Hx    Stomach cancer Neg Hx    Social History   Socioeconomic History   Marital status: Single    Spouse name: Not on file   Number of children: Not on file   Years of education: Not on file   Highest education level: Bachelor's degree (e.g., BA, AB, BS)  Occupational History   Not on file  Tobacco Use   Smoking status: Former    Current packs/day: 0.00    Average packs/day: 1.5 packs/day for 40.0 years (60.0 ttl pk-yrs)    Types: Cigarettes    Start date: 101    Quit date: 09/08/2010    Years since quitting: 13.7   Smokeless tobacco: Never  Vaping Use   Vaping status: Never Used  Substance and Sexual Activity    Alcohol  use: No   Drug use: Never   Sexual activity: Not Currently  Other Topics Concern   Not on file  Social History Narrative   Not on file   Social Drivers of Health   Financial Resource Strain: Low Risk  (06/17/2024)   Overall Financial Resource Strain (CARDIA)    Difficulty of Paying Living Expenses: Not hard at all  Food Insecurity: No Food Insecurity (06/17/2024)   Hunger Vital Sign    Worried About Running Out of Food in the Last Year: Never  true    Ran Out of Food in the Last Year: Never true  Transportation Needs: No Transportation Needs (06/17/2024)   PRAPARE - Administrator, Civil Service (Medical): No    Lack of Transportation (Non-Medical): No  Physical Activity: Sufficiently Active (06/17/2024)   Exercise Vital Sign    Days of Exercise per Week: 5 days    Minutes of Exercise per Session: 60 min  Stress: No Stress Concern Present (06/17/2024)   [employer] of Occupational Health - Occupational Stress Questionnaire    Feeling of Stress: Not at all  Social Connections: Moderately Isolated (06/17/2024)   Social Connection and Isolation Panel    Frequency of Communication with Friends and Family: Three times a week    Frequency of Social Gatherings with Friends and Family: More than three times a week    Attends Religious Services: More than 4 times per year    Active Member of [employer] or Organizations: No    Attends Banker Meetings: Never    Marital Status: Divorced    Tobacco Counseling Counseling given: Not Answered    Clinical Intake:  Pre-visit preparation completed: Yes  Pain : No/denies pain     Nutritional Risks: None Diabetes: No  Lab Results  Component Value Date   HGBA1C 6.0 03/11/2024   HGBA1C 5.5 04/17/2023   HGBA1C 5.5 02/22/2021     How often do you need to have someone help you when you read instructions, pamphlets, or other written materials from your doctor or pharmacy?: 1 - Never  Interpreter Needed?:  No  Information entered by :: NAllen LPN   Activities of Daily Living     06/16/2024    3:56 PM  In your present state of health, do you have any difficulty performing the following activities:  Hearing? 0  Vision? 0  Difficulty concentrating or making decisions? 0  Walking or climbing stairs? 0  Dressing or bathing? 0  Doing errands, shopping? 0  Preparing Food and eating ? N  Using the Toilet? N  In the past six months, have you accidently leaked urine? Y  Do you have problems with loss of bowel control? N  Managing your Medications? N  Managing your Finances? N  Housekeeping or managing your Housekeeping? N    Patient Care Team: Nedra Tinnie LABOR, NP as PCP - General (Internal Medicine)  I have updated your Care Teams any recent Medical Services you may have received from other providers in the past year.     Assessment:   This is a routine wellness examination for Lori Cook.  Hearing/Vision screen Hearing Screening - Comments:: Denies hearing issues Vision Screening - Comments:: Regular eye exams, [facility]   Goals Addressed             This Visit's Progress    Patient Stated       06/17/2024, wants to lose 10 pounds       Depression Screen     06/17/2024    2:21 PM 03/11/2024    8:19 AM 06/12/2023    2:01 PM 04/17/2023    1:12 PM 11/21/2022    8:12 AM 08/26/2022   12:03 PM 08/20/2021    8:18 AM  PHQ 2/9 Scores  PHQ - 2 Score 0 0 0 0 0 0 0  PHQ- 9 Score 0 0 0 0 0      Fall Risk     06/16/2024    3:56 PM 03/11/2024  8:14 AM 06/08/2023    7:56 PM 04/17/2023    1:11 PM 08/26/2022   12:02 PM  Fall Risk   Falls in the past year? 0 0 0 0 0  Number falls in past yr: 0 0 0 0 0  Injury with Fall? 0 0 0 0 0  Risk for fall due to : Medication side effect No Fall Risks Medication side effect No Fall Risks Medication side effect  Follow up Falls prevention discussed;Falls evaluation completed Falls evaluation completed Falls prevention discussed;Falls  evaluation completed Falls evaluation completed Falls prevention discussed;Education provided;Falls evaluation completed      Data saved with a previous flowsheet row definition    MEDICARE RISK AT HOME:  Medicare Risk at Home Any stairs in or around the home?: (Patient-Rptd) Yes If so, are there any without handrails?: (Patient-Rptd) Yes Home free of loose throw rugs in walkways, pet beds, electrical cords, etc?: (Patient-Rptd) Yes Adequate lighting in your home to reduce risk of falls?: (Patient-Rptd) Yes Use of a cane, walker or w/c?: (Patient-Rptd) No Grab bars in the bathroom?: (Patient-Rptd) Yes Shower chair or bench in shower?: (Patient-Rptd) No Elevated toilet seat or a handicapped toilet?: (Patient-Rptd) No  TIMED UP AND GO:  Was the test performed?  No  Cognitive Function: 6CIT completed    02/22/2021    9:03 AM  MMSE - Mini Mental State Exam  Orientation to time 5  Orientation to Place 5  Registration 3  Attention/ Calculation 5  Recall 3  Language- name 2 objects 2  Language- repeat 1  Language- follow 3 step command 3  Language- read & follow direction 1  Write a sentence 1  Copy design 1  Total score 30        06/17/2024    2:22 PM 06/12/2023    2:01 PM 08/26/2022   12:04 PM 12/23/2019   11:13 AM 11/21/2018    3:59 PM  6CIT Screen  What Year? 0 points 0 points 0 points 0 points   What month? 0 points 0 points 0 points 0 points 0 points  What time? 0 points 0 points 0 points 0 points   Count back from 20 0 points 0 points 0 points 0 points 0 points  Months in reverse 0 points 0 points 0 points 0 points 0 points  Repeat phrase 0 points 2 points 4 points 0 points 0 points  Total Score 0 points 2 points 4 points 0 points     Immunizations Immunization History  Administered Date(s) Administered   Fluad Quad(high Dose 65+) 06/21/2019   Influenza Split 07/28/2015   Influenza, Seasonal, Injecte, Preservative Fre 09/08/2012, 09/19/2013    Influenza,inj,Quad PF,6+ Mos 05/07/2016, 07/22/2017   Influenza,inj,Quad PF,6-35 Mos 07/27/2022   Influenza-Unspecified 06/26/2019, 06/25/2021, 06/30/2023   PFIZER(Purple Top)SARS-COV-2 Vaccination 10/27/2019, 11/19/2019   Pneumococcal Conjugate-13 08/12/2017   Pneumococcal Polysaccharide-23 11/09/2013, 11/21/2018   Td 03/12/1980, 03/11/2024   Tdap 11/09/2013   Zoster Recombinant(Shingrix) 06/28/2018, 08/30/2018   Zoster, Live 09/12/2013    Screening Tests Health Maintenance  Topic Date Due   Lung Cancer Screening  05/04/2024   Influenza Vaccine  12/10/2024 (Originally 04/12/2024)   Medicare Annual Wellness (AWV)  06/17/2025   Mammogram  04/20/2026   Colonoscopy  10/29/2030   DTaP/Tdap/Td (4 - Td or Tdap) 03/11/2034   Pneumococcal Vaccine: 50+ Years  Completed   DEXA SCAN  Completed   Hepatitis C Screening  Completed   Zoster Vaccines- Shingrix  Completed   Meningococcal B Vaccine  Aged Out   COVID-19 Vaccine  Discontinued    Health Maintenance Items Addressed: Up to date  Additional Screening:  Vision Screening: Recommended annual ophthalmology exams for early detection of glaucoma and other disorders of the eye. Is the patient up to date with their annual eye exam?  Yes  Who is the provider or what is the name of the office in which the patient attends annual eye exams? [facility]  Dental Screening: Recommended annual dental exams for proper oral hygiene  Community Resource Referral / Chronic Care Management: CRR required this visit?  No   CCM required this visit?  No   Plan:    I have personally reviewed and noted the following in the patient's chart:   Medical and social history Use of alcohol , tobacco or illicit drugs  Current medications and supplements including opioid prescriptions. Patient is not currently taking opioid prescriptions. Functional ability and status Nutritional status Physical activity Advanced directives List of other  physicians Hospitalizations, surgeries, and ER visits in previous 12 months Vitals Screenings to include cognitive, depression, and falls Referrals and appointments  In addition, I have reviewed and discussed with patient certain preventive protocols, quality metrics, and best practice recommendations. A written personalized care plan for preventive services as well as general preventive health recommendations were provided to patient.   Lori Cook FORBES Dawn, LPN   89/11/7972   After Visit Summary: (MyChart) Due to this being a telephonic visit, the after visit summary with patients personalized plan was offered to patient via MyChart   Notes: Nothing significant to report at this time.

## 2024-06-17 NOTE — Patient Instructions (Signed)
 [name],  Thank you for taking the time for your Medicare Wellness Visit. I appreciate your continued commitment to your health goals. Please review the care plan we discussed, and feel free to reach out if I can assist you further.  Medicare recommends these wellness visits once per year to help you and your care team stay ahead of potential health issues. These visits are designed to focus on prevention, allowing your provider to concentrate on managing your acute and chronic conditions during your regular appointments.  Please note that Annual Wellness Visits do not include a physical exam. Some assessments may be limited, especially if the visit was conducted virtually. If needed, we may recommend a separate in-person follow-up with your provider.  Ongoing Care Seeing your primary care provider every 3 to 6 months helps us  monitor your health and provide consistent, personalized care.   Referrals If a referral was made during today's visit and you haven't received any updates within two weeks, please contact the referred provider directly to check on the status.  Recommended Screenings:  Health Maintenance  Topic Date Due   Screening for Lung Cancer  05/04/2024   Flu Shot  12/10/2024*   Medicare Annual Wellness Visit  06/17/2025   Breast Cancer Screening  04/20/2026   Colon Cancer Screening  10/29/2030   DTaP/Tdap/Td vaccine (4 - Td or Tdap) 03/11/2034   Pneumococcal Vaccine for age over 53  Completed   DEXA scan (bone density measurement)  Completed   Hepatitis C Screening  Completed   Zoster (Shingles) Vaccine  Completed   Meningitis B Vaccine  Aged Out   COVID-19 Vaccine  Discontinued  *Topic was postponed. The date shown is not the original due date.       06/17/2024    2:20 PM  Advanced Directives  Does Patient Have a Medical Advance Directive? Yes  Type of Estate agent of [place];Living will  Copy of Healthcare Power of Attorney in Chart? No -  copy requested   Advance Care Planning is important because it: Ensures you receive medical care that aligns with your values, goals, and preferences. Provides guidance to your family and loved ones, reducing the emotional burden of decision-making during critical moments.  Vision: Annual vision screenings are recommended for early detection of glaucoma, cataracts, and diabetic retinopathy. These exams can also reveal signs of chronic conditions such as diabetes and high blood pressure.  Dental: Annual dental screenings help detect early signs of oral cancer, gum disease, and other conditions linked to overall health, including heart disease and diabetes.  Please see the attached documents for additional preventive care recommendations.

## 2024-06-21 ENCOUNTER — Ambulatory Visit
Admission: RE | Admit: 2024-06-21 | Discharge: 2024-06-21 | Disposition: A | Discharge status: Home / Self Care | Source: Ambulatory Visit | Attending: Nurse Practitioner | Admitting: Nurse Practitioner

## 2024-06-21 DIAGNOSIS — F17211 Nicotine dependence, cigarettes, in remission: Secondary | ICD-10-CM

## 2024-06-21 DIAGNOSIS — Z87891 Personal history of nicotine dependence: Secondary | ICD-10-CM | POA: Diagnosis not present

## 2024-06-26 ENCOUNTER — Ambulatory Visit: Payer: Self-pay | Admitting: Nurse Practitioner

## 2024-07-01 ENCOUNTER — Other Ambulatory Visit: Payer: Self-pay | Admitting: Internal Medicine

## 2024-07-10 ENCOUNTER — Other Ambulatory Visit: Payer: Self-pay | Admitting: Nurse Practitioner

## 2024-07-10 DIAGNOSIS — I10 Essential (primary) hypertension: Secondary | ICD-10-CM

## 2024-07-10 DIAGNOSIS — E785 Hyperlipidemia, unspecified: Secondary | ICD-10-CM

## 2024-07-12 ENCOUNTER — Encounter: Payer: Self-pay | Admitting: Nurse Practitioner

## 2024-07-12 ENCOUNTER — Other Ambulatory Visit: Payer: Self-pay | Admitting: Nurse Practitioner

## 2024-07-12 DIAGNOSIS — M7552 Bursitis of left shoulder: Secondary | ICD-10-CM

## 2024-07-12 MED ORDER — MELOXICAM 7.5 MG PO TABS: 7.5000 mg | ORAL_TABLET | Freq: Every day | ORAL | 0 refills | Status: AC

## 2024-07-12 NOTE — Telephone Encounter (Signed)
 Requesting: Meloxicam  7.5mg  Last Visit: 04/29/2024 Next Visit: 07/12/2024 Last Refill: 04/01/2024  Please Advise

## 2024-07-12 NOTE — Telephone Encounter (Signed)
 Copied from CRM #[id number]. Topic: Clinical - Medication Refill >> Jul 12, 2024 11:29 AM Thersia C wrote: Medication: meloxicam  (MOBIC ) 7.5 MG tablet - patient stated she is out   Has the patient contacted their pharmacy? Yes (Agent: If no, request that the patient contact the pharmacy for the refill. If patient does not wish to contact the pharmacy document the reason why and proceed with request.) (Agent: If yes, when and what did the pharmacy advise?)  This is the patient's preferred pharmacy:  [facility] [address] [place] - [address] [place] [address] [place] [address] Phone: [phone] Fax: [phone]   Is this the correct pharmacy for this prescription? Yes If no, delete pharmacy and type the correct one.   Has the prescription been filled recently? No  Is the patient out of the medication? Yes  Has the patient been seen for an appointment in the last year OR does the patient have an upcoming appointment? Yes  Can we respond through MyChart? Yes  Agent: Please be advised that Rx refills may take up to 3 business days. We ask that you follow-up with your pharmacy.

## 2024-07-16 ENCOUNTER — Other Ambulatory Visit: Payer: Self-pay | Admitting: Nurse Practitioner

## 2024-08-15 ENCOUNTER — Encounter: Payer: Self-pay | Admitting: Internal Medicine

## 2024-08-15 ENCOUNTER — Ambulatory Visit: Admitting: Internal Medicine

## 2024-08-15 ENCOUNTER — Other Ambulatory Visit

## 2024-08-15 VITALS — BP 130/82 | Ht 67.0 in | Wt 165.0 lb

## 2024-08-15 DIAGNOSIS — E559 Vitamin D deficiency, unspecified: Secondary | ICD-10-CM | POA: Diagnosis not present

## 2024-08-15 DIAGNOSIS — M810 Age-related osteoporosis without current pathological fracture: Secondary | ICD-10-CM

## 2024-08-15 DIAGNOSIS — E210 Primary hyperparathyroidism: Secondary | ICD-10-CM

## 2024-08-15 NOTE — Patient Instructions (Signed)
-   Stay Hydrated  - Avoid over the counter calcium tablets  - Consume 2-3 servings of calcium in your diet daily  - Continue Vitamin D 2000 iu daily

## 2024-08-15 NOTE — Progress Notes (Signed)
 Name: Lori Cook  MRN/ DOB: [id number], [birth year]    Age/ Sex: 73 y.o., female     PCP: Nedra Tinnie LABOR, NP   Reason for Endocrinology Evaluation: Hypercalcemia      Initial Endocrinology Clinic Visit: 01/08/2020    PATIENT IDENTIFIER: Lori Cook is a 73 y.o., female with a past medical history of Osteopenia and dyslipidemia. She has followed with [facility] since 01/08/2020 for consultative assistance with management of her hypercalcemia.   HISTORICAL SUMMARY: The patient was first diagnosed with intermittent hypercalcemia since 2016  with serum calcium os 10.5 mg/dL ( Corrected calcium 0.21) and again in 2019 , serum calcium 10.4 ( corrected 9.84 mg/dL ) and in 03/7977 at 89.1 ( Corrected 10.32).  Her PTH has been fluctuating as high as 75 pg/mL .  She has a hx of renal stones  She has low bone density on DXA ( 10/24/2018)  As well as slip and fall wrist fracture  She was started on Alendronate  in 12/2019 due to clinical osteoporosis   DXA 12/24/2021 showed osteoporosis at the distal radius with a T score of -3.0  HCTZ was stopped in 10/2019 but was back on it by 2022 due to LE edema   24-hr urine Ca/Cr ratio of 0.019 with calcium of 224 mg, repeat low at 28 mcg 2023 while on hydrochlorothiazide    SUBJECTIVE:     Today (08/15/2024):  Lori Cook is here for hypercalcemia and osteoporosis.  She has Hx of GERD, on Prilosecn and famotidine   Denies polydipsia No polyuria   No constipation  She did fall over her dog within this year  Consumes 2 servings of dietary calcium daily  She is c/o weight gain     HOME ENDOCRINE MEDICATIONS: Alendronate  70 mg weekly  Vitamin D  2000 iu daily    HISTORY:  Past Medical History:  Past Medical History:  Diagnosis Date   Allergy    Arthritis    Basal cell carcinoma    Blood transfusion without reported diagnosis    Cataract    Cholelithiasis    large stone detected by CT 01/2016   GERD  (gastroesophageal reflux disease)    Hyperlipidemia    Hypertension    Nephrolithiasis    2mm kidney stone L by CT 01/2016   Osteopenia    June 2025 per patient   Osteoporosis    Retinal vein occlusion (HCC)    right eye per patient   Retinal vein occlusion, branch (HCC) 02/11/2015   Past Surgical History:  Past Surgical History:  Procedure Laterality Date   ABDOMINAL HYSTERECTOMY     APPENDECTOMY     BASAL CELL CARCINOMA EXCISION     [facility] January 2025   CATARACT EXTRACTION Bilateral 12/2023   CHOLECYSTECTOMY     COLONOSCOPY     [facility]- records purges and destroyed- per pt Normal    JOINT REPLACEMENT N/A    [facility] 01/08/2021   SPINE SURGERY     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   Procedure: LEFT TOTAL HIP ARTHROPLASTY ANTERIOR APPROACH;  Surgeon: Jerri Kay HERO, MD;  Location: [facility] OR;  Service: Orthopedics;  Laterality: Left;   TOTAL HIP ARTHROPLASTY Right 06/15/2020   Procedure: RIGHT TOTAL HIP ARTHROPLASTY ANTERIOR APPROACH;  Surgeon: Jerri Kay HERO, MD;  Location: [facility] OR;  Service: Orthopedics;  Laterality: Right;   Social History:  reports that she quit smoking about 13 years ago. Her smoking use included cigarettes. She started smoking  about 53 years ago. She has a 60 pack-year smoking history. She has never used smokeless tobacco. She reports that she does not drink alcohol  and does not use drugs. Family History:  Family History  Problem Relation Age of Onset   Mental illness Mother    Stroke Father    Diabetes Father    Heart disease Father 64       AMI   Hypertension Sister    Hypertension Brother    Arthritis Brother    ADD / ADHD Daughter    Heart disease Paternal Uncle    Colon cancer Neg Hx    Colon polyps Neg Hx    Esophageal cancer Neg Hx    Rectal cancer Neg Hx    Stomach cancer Neg Hx      HOME MEDICATIONS: Allergies as of 08/15/2024       Reactions   Other    Tramadol Nausea And Vomiting   Codeine Other (See  Comments)   Headache   Oxycodone  Palpitations        Medication List        Accurate as of August 15, 2024  8:14 AM. If you have any questions, ask your nurse or doctor.          acetaminophen  500 MG tablet Commonly known as: TYLENOL  Take 1,000 mg by mouth every 6 (six) hours as needed for moderate pain.   alendronate  70 MG tablet Commonly known as: FOSAMAX  Take 1 tablet by mouth once a week   amLODipine  10 MG tablet Commonly known as: NORVASC  Take 1 tablet by mouth once daily   aspirin  EC 81 MG tablet Take 81 mg by mouth daily. Swallow whole.   fluticasone  50 MCG/ACT nasal spray Commonly known as: FLONASE  Place 2 sprays into both nostrils daily.   gabapentin  100 MG capsule Commonly known as: NEURONTIN  Take 1 capsule by mouth at bedtime   hydrochlorothiazide  25 MG tablet Commonly known as: HYDRODIURIL  Take 1 tablet by mouth once daily   meloxicam  7.5 MG tablet Commonly known as: MOBIC  Take 1 tablet (7.5 mg total) by mouth daily.   naproxen sodium 220 MG tablet Commonly known as: ALEVE Take 440 mg by mouth 2 (two) times daily as needed (pain).   omeprazole  40 MG capsule Commonly known as: PRILOSEC Take 1 capsule by mouth once daily   simvastatin  40 MG tablet Commonly known as: ZOCOR  Take 1 tablet by mouth once daily with breakfast   VITAMIN D3 PO Take by mouth.          OBJECTIVE:   PHYSICAL EXAM: VS: BP 130/82   Ht 5' 7 (1.702 m)   Wt 165 lb (74.8 kg)   BMI 25.84 kg/m    EXAM: General: Pt appears well and is in NAD  Neck: General: Supple without adenopathy. Thyroid : Thyroid  size normal.  No goiter or nodules appreciated. No thyroid  bruit.  Lungs: Clear with good BS bilat  Heart: Auscultation: RRR.  Extremities:  BL LE: No pretibial edema norm  Mental Status: Judgment, insight: Intact Orientation: Oriented to time, place, and person Memory: Intact for recent and remote events Mood and affect: No depression, anxiety, or  agitation     DATA REVIEWED:   Latest Reference Range & Units 08/15/24 08:36  Calcium 8.6 - 10.4 mg/dL 89.3 (H)  Vitamin D , 25-Hydroxy 30 - 100 ng/mL 33    Latest Reference Range & Units 08/15/24 08:36  PTH, Intact 16 - 77 pg/mL 102 (H)    Latest  Reference Range & Units 08/15/24 08:36  Albumin MSPROF 3.6 - 5.1 g/dL 4.9      Latest Reference Range & Units 02/26/24 08:49  Sodium 135 - 146 mmol/L 140  Potassium 3.5 - 5.3 mmol/L 3.9  Chloride 98 - 110 mmol/L 100  CO2 20 - 32 mmol/L 32  Glucose 65 - 99 mg/dL 96  BUN 7 - 25 mg/dL 15  Creatinine 9.39 - 8.99 mg/dL 9.34  Calcium 8.6 - 89.5 mg/dL 89.1 (H)  BUN/Creatinine Ratio 6 - 22 (calc) SEE NOTE:  eGFR > OR = 60 mL/min/1.83m2 94  Vitamin D , 25-Hydroxy 30 - 100 ng/mL 38     Latest Reference Range & Units 02/26/24 08:49  Glucose 65 - 99 mg/dL 96  PTH, Intact 16 - 77 pg/mL 82 (H)  TSH 0.40 - 4.50 mIU/L 1.58  T4,Free(Direct) 0.8 - 1.8 ng/dL 1.3  (H): Data is abnormally high  DXA 03/01/2024 @ [facility]   AP Spine -1.8 1/3 distal radius -2.2    [facility]    The BMD measured at Forearm Radius 33% is 0.617 g/cm2 with a T-score of -3.0. This patient is considered osteoporotic according to World Health Organization [facility]) criteria.   The quality of the exam is good. The lumbar spine was excluded due to degenerative changes. Left hip excluded due to surgical hardware. Right hip excluded due to surgical hardware.   Site Region Measured Date Measured Age YA BMD Significant CHANGE T-score Right Forearm Radius 33% 12/24/2021 69.6 -3.0 0.617 g/cm2  ASSESSMENT / PLAN / RECOMMENDATIONS:   Primary Hyperparathyroidism:   - Calcium normalized with discontinuation of HCTZ and stopping OTC calcium, but she had to be restarted on HCTZ in 2022 due to lower extremity edema, serum calcium started to increase 04/2023. - 24-hr urine Ca/Cr ratio of 0.019 with calcium of 224 mg (08/2020), repeat 24-hour urinary calcium was  low at 28 Mg (09/2022) while on hydrochlorothiazide  - DXA 02/2024 revealed osteopenia - Serum calcium 10.6 (with correction it is normal at 9.88 MGs/DL), PTH remains elevated with normal vitamin D   Recommendations - Encouraged hydration  - Advised to continue avoiding OTC calcium  - Pt to consume 2-3 servings of dietary calcium     2. Clinical osteoporosis:  - We discussed the high FRAX rate  Of > 3% at the hips as well as a hx of fragility fracture in the past - DXA showed osteoporosis of the left distal radius 12/24/2021 with a T score of -3.0.  Repeat DXA scan (unable to compare to prior due to different machine use) -2.2 - I again emphasized the importance of consuming 2-3 servings of dietary calcium a day    Medication :   Continue Alendronate  70 mg weekly    3. Vitamin D  Deficiency :   - Remains normal   Medication  Continue Vitamin D3 2000 iu daily     F/U in 6 months     Signed electronically by: Stefano Redgie Butts, MD  [facility]  [facility] [address] [place] [address] [place], [place] [address] Phone: [phone] FAX: [phone]      CC: Nedra Tinnie LABOR, NP [address] [place] [address] Phone: [phone]  Fax: [phone]   Return to Endocrinology clinic as below: Future Appointments  Date Time Provider Department Center  03/18/2025  7:40 AM Nedra, Tinnie LABOR, NP [facility]  06/23/2025  4:20 PM [facility]ANNUAL WELLNESS VISIT [facility]

## 2024-08-16 ENCOUNTER — Ambulatory Visit: Payer: Self-pay | Admitting: Internal Medicine

## 2024-08-16 LAB — VITAMIN D 25 HYDROXY (VIT D DEFICIENCY, FRACTURES): Vit D, 25-Hydroxy: 33 ng/mL (ref 30–100)

## 2024-08-16 LAB — ALBUMIN: Albumin: 4.9 g/dL (ref 3.6–5.1)

## 2024-08-16 LAB — PTH, INTACT AND CALCIUM
Calcium: 10.6 mg/dL — ABNORMAL HIGH (ref 8.6–10.4)
PTH: 102 pg/mL — ABNORMAL HIGH (ref 16–77)

## 2024-09-21 ENCOUNTER — Other Ambulatory Visit: Payer: Self-pay | Admitting: Internal Medicine

## 2024-09-27 ENCOUNTER — Telehealth: Admitting: Physician Assistant

## 2024-09-27 DIAGNOSIS — U071 COVID-19: Secondary | ICD-10-CM

## 2024-09-27 MED ORDER — NIRMATRELVIR/RITONAVIR (PAXLOVID)TABLET: 3.0000 | ORAL_TABLET | Freq: Two times a day (BID) | ORAL | 0 refills | Status: AC

## 2024-09-27 NOTE — Patient Instructions (Signed)
 " Lori Cook, thank you for joining Lori CHRISTELLA Dickinson, PA-C for today's virtual visit.  While this provider is not your primary care provider (PCP), if your PCP is located in our provider database this encounter information will be shared with them immediately following your visit.   A [facility] MyChart account gives you access to today's visit and all your visits, tests, and labs performed at [facility]  click here if you don't have a [facility] MyChart account or go to [email or web address]  Consent: (Patient) Lori Cook provided verbal consent for this virtual visit at the beginning of the encounter.  Current Medications:  Current Outpatient Medications:    nirmatrelvir /ritonavir  (PAXLOVID ) 20 x 150 MG & 10 x 100MG  TABS, Take 3 tablets by mouth 2 (two) times daily for 5 days. (Take nirmatrelvir  150 mg two tablets twice daily for 5 days and ritonavir  100 mg one tablet twice daily for 5 days) Patient GFR is 78.59, Disp: 30 tablet, Rfl: 0   acetaminophen  (TYLENOL ) 500 MG tablet, Take 1,000 mg by mouth every 6 (six) hours as needed for moderate pain. , Disp: , Rfl:    alendronate  (FOSAMAX ) 70 MG tablet, Take 1 tablet by mouth once a week, Disp: 12 tablet, Rfl: 0   amLODipine  (NORVASC ) 10 MG tablet, Take 1 tablet by mouth once daily, Disp: 90 tablet, Rfl: 0   aspirin  EC 81 MG tablet, Take 81 mg by mouth daily. Swallow whole., Disp: , Rfl:    Cholecalciferol (VITAMIN D3 PO), Take by mouth., Disp: , Rfl:    fluticasone  (FLONASE ) 50 MCG/ACT nasal spray, Place 2 sprays into both nostrils daily., Disp: 1 g, Rfl: 0   gabapentin  (NEURONTIN ) 100 MG capsule, Take 1 capsule by mouth at bedtime, Disp: 90 capsule, Rfl: 2   hydrochlorothiazide  (HYDRODIURIL ) 25 MG tablet, Take 1 tablet by mouth once daily, Disp: 90 tablet, Rfl: 0   meloxicam  (MOBIC ) 7.5 MG tablet, Take 1 tablet (7.5 mg total) by mouth daily., Disp: 30 tablet, Rfl: 0   naproxen sodium (ALEVE) 220 MG tablet,  Take 440 mg by mouth 2 (two) times daily as needed (pain)., Disp: , Rfl:    omeprazole  (PRILOSEC) 40 MG capsule, Take 1 capsule by mouth once daily, Disp: 90 capsule, Rfl: 0   simvastatin  (ZOCOR ) 40 MG tablet, Take 1 tablet by mouth once daily with breakfast, Disp: 90 tablet, Rfl: 0   Medications ordered in this encounter:  Meds ordered this encounter  Medications   nirmatrelvir /ritonavir  (PAXLOVID ) 20 x 150 MG & 10 x 100MG  TABS    Sig: Take 3 tablets by mouth 2 (two) times daily for 5 days. (Take nirmatrelvir  150 mg two tablets twice daily for 5 days and ritonavir  100 mg one tablet twice daily for 5 days) Patient GFR is 78.59    Dispense:  30 tablet    Refill:  0    Supervising Provider:   BLAISE ALEENE KIDD [phone]     *If you need refills on other medications prior to your next appointment, please contact your pharmacy*  Follow-Up: Call back or seek an in-person evaluation if the symptoms worsen or if the condition fails to improve as anticipated.   Virtual Care [phone]  Care Instructions: Can take to lessen severity (if able): Vit C 500mg  twice daily Quercertin 250-500mg  twice daily Zinc 75-100mg  daily Melatonin 3-6 mg at bedtime Vit D3 1000-2000 IU daily Aspirin  81 mg daily with food Optional: Famotidine  20mg  daily Also can add tylenol /ibuprofen   as needed for fevers and body aches May add Mucinex or Mucinex DM as needed for cough/congestion    Isolation Instructions: You are to isolate at home until you have been fever free for at least 24 hours without a fever-reducing medication, and symptoms have been steadily improving for 24 hours. At that time,  you can end isolation but need to mask for an additional 5 days.   If you must be around other household members who do not have symptoms, you need to make sure that both you and the family members are masking consistently with a high-quality mask.  If you note any worsening of symptoms despite treatment,  please seek an in-person evaluation ASAP. If you note any significant shortness of breath or any chest pain, please seek ER evaluation. Please do not delay care!   COVID-19: What to Do if You Are Sick If you test positive and are an older adult or someone who is at high risk of getting very sick from COVID-19, treatment may be available. Contact a healthcare provider right away after a positive test to determine if you are eligible, even if your symptoms are mild right now. You can also visit a Test to Treat location and, if eligible, receive a prescription from a provider. Don't delay: Treatment must be started within the first few days to be effective. If you have a fever, cough, or other symptoms, you might have COVID-19. Most people have mild illness and are able to recover at home. If you are sick: Keep track of your symptoms. If you have an emergency warning sign (including trouble breathing), call 911. Steps to help prevent the spread of COVID-19 if you are sick If you are sick with COVID-19 or think you might have COVID-19, follow the steps below to care for yourself and to help protect other people in your home and community. Stay home except to get medical care Stay home. Most people with COVID-19 have mild illness and can recover at home without medical care. Do not leave your home, except to get medical care. Do not visit public areas and do not go to places where you are unable to wear a mask. Take care of yourself. Get rest and stay hydrated. Take over-the-counter medicines, such as acetaminophen , to help you feel better. Stay in touch with your doctor. Call before you get medical care. Be sure to get care if you have trouble breathing, or have any other emergency warning signs, or if you think it is an emergency. Avoid public transportation, ride-sharing, or taxis if possible. Get tested If you have symptoms of COVID-19, get tested. While waiting for test results, stay away from  others, including staying apart from those living in your household. Get tested as soon as possible after your symptoms start. Treatments may be available for people with COVID-19 who are at risk for becoming very sick. Don't delay: Treatment must be started early to be effective--some treatments must begin within 5 days of your first symptoms. Contact your healthcare provider right away if your test result is positive to determine if you are eligible. Self-tests are one of several options for testing for the virus that causes COVID-19 and may be more convenient than laboratory-based tests and point-of-care tests. Ask your healthcare provider or your local health department if you need help interpreting your test results. You can visit your state, tribal, local, and territorial health department's website to look for the latest local information on testing sites. Separate yourself from  other people As much as possible, stay in a specific room and away from other people and pets in your home. If possible, you should use a separate bathroom. If you need to be around other people or animals in or outside of the home, wear a well-fitting mask. Tell your close contacts that they may have been exposed to COVID-19. An infected person can spread COVID-19 starting 48 hours (or 2 days) before the person has any symptoms or tests positive. By letting your close contacts know they may have been exposed to COVID-19, you are helping to protect everyone. See COVID-19 and Animals if you have questions about pets. If you are diagnosed with COVID-19, someone from the health department may call you. Answer the call to slow the spread. Monitor your symptoms Symptoms of COVID-19 include fever, cough, or other symptoms. Follow care instructions from your healthcare provider and local health department. Your local health authorities may give instructions on checking your symptoms and reporting information. When to seek  emergency medical attention Look for emergency warning signs* for COVID-19. If someone is showing any of these signs, seek emergency medical care immediately: Trouble breathing Persistent pain or pressure in the chest New confusion Inability to wake or stay awake Pale, gray, or blue-colored skin, lips, or nail beds, depending on skin tone *This list is not all possible symptoms. Please call your medical provider for any other symptoms that are severe or concerning to you. Call 911 or call ahead to your local emergency facility: Notify the operator that you are seeking care for someone who has or may have COVID-19. Call ahead before visiting your doctor Call ahead. Many medical visits for routine care are being postponed or done by phone or telemedicine. If you have a medical appointment that cannot be postponed, call your doctor's office, and tell them you have or may have COVID-19. This will help the office protect themselves and other patients. If you are sick, wear a well-fitting mask You should wear a mask if you must be around other people or animals, including pets (even at home). Wear a mask with the best fit, protection, and comfort for you. You don't need to wear the mask if you are alone. If you can't put on a mask (because of trouble breathing, for example), cover your coughs and sneezes in some other way. Try to stay at least 6 feet away from other people. This will help protect the people around you. Masks should not be placed on young children under age 29 years, anyone who has trouble breathing, or anyone who is not able to remove the mask without help. Cover your coughs and sneezes Cover your mouth and nose with a tissue when you cough or sneeze. Throw away used tissues in a lined trash can. Immediately wash your hands with soap and water  for at least 20 seconds. If soap and water  are not available, clean your hands with an alcohol -based hand sanitizer that contains at least 60%  alcohol . Clean your hands often Wash your hands often with soap and water  for at least 20 seconds. This is especially important after blowing your nose, coughing, or sneezing; going to the bathroom; and before eating or preparing food. Use hand sanitizer if soap and water  are not available. Use an alcohol -based hand sanitizer with at least 60% alcohol , covering all surfaces of your hands and rubbing them together until they feel dry. Soap and water  are the best option, especially if hands are visibly dirty. Avoid touching  your eyes, nose, and mouth with unwashed hands. Handwashing Tips Avoid sharing personal household items Do not share dishes, drinking glasses, cups, eating utensils, towels, or bedding with other people in your home. Wash these items thoroughly after using them with soap and water  or put in the dishwasher. Clean surfaces in your home regularly Clean and disinfect high-touch surfaces (for example, doorknobs, tables, handles, light switches, and countertops) in your sick room and bathroom. In shared spaces, you should clean and disinfect surfaces and items after each use by the person who is ill. If you are sick and cannot clean, a caregiver or other person should only clean and disinfect the area around you (such as your bedroom and bathroom) on an as needed basis. Your caregiver/other person should wait as long as possible (at least several hours) and wear a mask before entering, cleaning, and disinfecting shared spaces that you use. Clean and disinfect areas that may have blood, stool, or body fluids on them. Use household cleaners and disinfectants. Clean visible dirty surfaces with household cleaners containing soap or detergent. Then, use a household disinfectant. Use a product from [employer] List N: Disinfectants for Coronavirus (COVID-19). Be sure to follow the instructions on the label to ensure safe and effective use of the product. Many products recommend keeping the surface  wet with a disinfectant for a certain period of time (look at contact time on the product label). You may also need to wear personal protective equipment, such as gloves, depending on the directions on the product label. Immediately after disinfecting, wash your hands with soap and water  for 20 seconds. For completed guidance on cleaning and disinfecting your home, visit Complete Disinfection Guidance. Take steps to improve ventilation at home Improve ventilation (air flow) at home to help prevent from spreading COVID-19 to other people in your household. Clear out COVID-19 virus particles in the air by opening windows, using air filters, and turning on fans in your home. Use this interactive tool to learn how to improve air flow in your home. When you can be around others after being sick with COVID-19 Deciding when you can be around others is different for different situations. Find out when you can safely end home isolation. For any additional questions about your care, contact your healthcare provider or state or local health department. 12/01/2020 Content source: [facility] ([facility]), Division of Viral Diseases This information is not intended to replace advice given to you by your health care provider. Make sure you discuss any questions you have with your health care provider. Document Revised: 01/14/2021 Document Reviewed: 01/14/2021 Elsevier Patient Education  2022 [employer].     If you have been instructed to have an in-person evaluation today at a local Urgent Care facility, please use the link below. It will take you to a list of all of our available [facility], including address, phone number and hours of operation. Please do not delay care.  [facility]  If you or a family member do not have a primary care provider, use the link below to schedule a visit and establish care. When you choose a [facility]  primary care physician or advanced practice provider, you gain a long-term partner in health. Find a Primary Care Provider  Learn more about [facility]'s in-office and virtual care options: [facility] - Get Care Now  "

## 2024-09-27 NOTE — Progress Notes (Signed)
 " Virtual Visit Consent   Lori Cook, you are scheduled for a virtual visit with a [facility] provider today. Just as with appointments in the office, your consent must be obtained to participate. Your consent will be active for this visit and any virtual visit you may have with one of our providers in the next 365 days. If you have a MyChart account, a copy of this consent can be sent to you electronically.  As this is a virtual visit, video technology does not allow for your provider to perform a traditional examination. This may limit your provider's ability to fully assess your condition. If your provider identifies any concerns that need to be evaluated in person or the need to arrange testing (such as labs, EKG, etc.), we will make arrangements to do so. Although advances in technology are sophisticated, we cannot ensure that it will always work on either your end or our end. If the connection with a video visit is poor, the visit may have to be switched to a telephone visit. With either a video or telephone visit, we are not always able to ensure that we have a secure connection.  By engaging in this virtual visit, you consent to the provision of healthcare and authorize for your insurance to be billed (if applicable) for the services provided during this visit. Depending on your insurance coverage, you may receive a charge related to this service.  I need to obtain your verbal consent now. Are you willing to proceed with your visit today? Prue Lingenfelter has provided verbal consent on 09/27/2024 for a virtual visit (video or telephone). [name], PA-C  Date: 09/27/2024 9:33 AM   Virtual Visit via Video Note   I, [name], connected with  Lori Cook  ([id number], Jan 11, 1951) on 09/27/24 at  9:15 AM EST by a video-enabled telemedicine application and verified that I am speaking with the correct person using two identifiers.  Location: Patient: Virtual Visit  Location Patient: Home Provider: Virtual Visit Location Provider: Home Office   I discussed the limitations of evaluation and management by telemedicine and the availability of in person appointments. The patient expressed understanding and agreed to proceed.    History of Present Illness: Lori Cook is a 74 y.o. who identifies as a female who was assigned female at birth, and is being seen today for Covid 5.  HPI: URI  This is a new problem. The current episode started in the past 7 days (Started Wednesday, 09/25/24). The problem has been unchanged. There has been no fever. Associated symptoms include congestion, headaches, rhinorrhea and sinus pain. Pertinent negatives include no diarrhea, ear pain, nausea, plugged ear sensation, sore throat, vomiting or wheezing. She has tried acetaminophen  for the symptoms. The treatment provided moderate relief.    Problems:  Patient Active Problem List   Diagnosis Date Noted   Nonintractable episodic headache 04/29/2024   Chronic left shoulder pain 04/29/2024   Routine general medical examination at a health care facility 03/11/2024   Aortic atherosclerosis 09/12/2023   Centrilobular emphysema (HCC) 09/12/2023   Nocturia 09/12/2023   Primary hyperparathyroidism 07/31/2023   Cigarette nicotine dependence in remission 04/17/2023   Right thigh pain 04/17/2023   Prediabetes 02/21/2021   Status post total replacement of right hip 06/15/2020   Primary osteoarthritis of right hip 06/14/2020   Vitamin D  insufficiency 01/09/2020   Osteoporosis without current pathological fracture 01/07/2020   Hypercalcemia 01/07/2020   Status post total replacement of  left hip 11/25/2019   Calculus of gallbladder without cholecystitis without obstruction 05/07/2016   Nephrolithiasis 05/07/2016   Pulmonary nodules 05/07/2016   Hematuria, microscopic 05/07/2016   Essential hypertension, benign 04/24/2015   Allergic rhinitis due to pollen 04/24/2015   Retinal  vein occlusion, branch (HCC) 04/24/2015   Retinal vein occlusion (HCC) 03/08/2015   Hyperlipidemia with target LDL less than 130 09/09/2012    Allergies: Allergies[1] Medications: Current Medications[2]  Observations/Objective: Patient is well-developed, well-nourished in no acute distress.  Resting comfortably at home.  Head is normocephalic, atraumatic.  No labored breathing.  Speech is clear and coherent with logical content.  Patient is alert and oriented at baseline.    Assessment and Plan: 1. COVID-19 (Primary) - nirmatrelvir /ritonavir  (PAXLOVID ) 20 x 150 MG & 10 x 100MG  TABS; Take 3 tablets by mouth 2 (two) times daily for 5 days. (Take nirmatrelvir  150 mg two tablets twice daily for 5 days and ritonavir  100 mg one tablet twice daily for 5 days) Patient GFR is 78.59  Dispense: 30 tablet; Refill: 0  - Continue OTC symptomatic management of choice - Will send OTC vitamins and supplement information through AVS - Paxlovid  prescribed - Push fluids - Rest as needed - Discussed return precautions and when to seek in-person evaluation, sent via AVS as well   Follow Up Instructions: I discussed the assessment and treatment plan with the patient. The patient was provided an opportunity to ask questions and all were answered. The patient agreed with the plan and demonstrated an understanding of the instructions.  A copy of instructions were sent to the patient via MyChart unless otherwise noted below.    The patient was advised to call back or seek an in-person evaluation if the symptoms worsen or if the condition fails to improve as anticipated.    [name], PA-C     [1]  Allergies Allergen Reactions   Other    Tramadol Nausea And Vomiting   Codeine Other (See Comments)    Headache   Oxycodone  Palpitations  [2]  Current Outpatient Medications:    nirmatrelvir /ritonavir  (PAXLOVID ) 20 x 150 MG & 10 x 100MG  TABS, Take 3 tablets by mouth 2 (two) times daily for 5  days. (Take nirmatrelvir  150 mg two tablets twice daily for 5 days and ritonavir  100 mg one tablet twice daily for 5 days) Patient GFR is 78.59, Disp: 30 tablet, Rfl: 0   acetaminophen  (TYLENOL ) 500 MG tablet, Take 1,000 mg by mouth every 6 (six) hours as needed for moderate pain. , Disp: , Rfl:    alendronate  (FOSAMAX ) 70 MG tablet, Take 1 tablet by mouth once a week, Disp: 12 tablet, Rfl: 0   amLODipine  (NORVASC ) 10 MG tablet, Take 1 tablet by mouth once daily, Disp: 90 tablet, Rfl: 0   aspirin  EC 81 MG tablet, Take 81 mg by mouth daily. Swallow whole., Disp: , Rfl:    Cholecalciferol (VITAMIN D3 PO), Take by mouth., Disp: , Rfl:    fluticasone  (FLONASE ) 50 MCG/ACT nasal spray, Place 2 sprays into both nostrils daily., Disp: 1 g, Rfl: 0   gabapentin  (NEURONTIN ) 100 MG capsule, Take 1 capsule by mouth at bedtime, Disp: 90 capsule, Rfl: 2   hydrochlorothiazide  (HYDRODIURIL ) 25 MG tablet, Take 1 tablet by mouth once daily, Disp: 90 tablet, Rfl: 0   meloxicam  (MOBIC ) 7.5 MG tablet, Take 1 tablet (7.5 mg total) by mouth daily., Disp: 30 tablet, Rfl: 0   naproxen sodium (ALEVE) 220 MG tablet, Take 440 mg  by mouth 2 (two) times daily as needed (pain)., Disp: , Rfl:    omeprazole  (PRILOSEC) 40 MG capsule, Take 1 capsule by mouth once daily, Disp: 90 capsule, Rfl: 0   simvastatin  (ZOCOR ) 40 MG tablet, Take 1 tablet by mouth once daily with breakfast, Disp: 90 tablet, Rfl: 0  "

## 2024-09-30 ENCOUNTER — Encounter: Payer: Self-pay | Admitting: Nurse Practitioner

## 2024-10-10 ENCOUNTER — Other Ambulatory Visit: Payer: Self-pay | Admitting: Nurse Practitioner

## 2024-10-10 DIAGNOSIS — E785 Hyperlipidemia, unspecified: Secondary | ICD-10-CM

## 2024-10-10 DIAGNOSIS — I10 Essential (primary) hypertension: Secondary | ICD-10-CM

## 2024-10-10 NOTE — Telephone Encounter (Signed)
 Requesting: amLODIPine  Besylate 10 MG Oral Tablet ,  Simvastatin  40 MG Oral Tablet , hydroCHLOROthiazide  25 MG Oral Tablet  Last Visit: 04/29/2024 Next Visit: 03/18/2025 Last Refill: 07/10/2024  Please Advise

## 2025-02-10 ENCOUNTER — Ambulatory Visit: Admitting: Internal Medicine

## 2025-03-17 ENCOUNTER — Encounter: Admitting: Nurse Practitioner

## 2025-03-18 ENCOUNTER — Encounter: Admitting: Nurse Practitioner

## 2025-06-23 ENCOUNTER — Ambulatory Visit
